# Patient Record
Sex: Female | Born: 1940 | Race: White | Hispanic: No | Smoking: Former smoker
Health system: Southern US, Community
[De-identification: ages and names within clinical notes are randomized; demographics above are authoritative.]

## PROBLEM LIST (undated history)

## (undated) DIAGNOSIS — K5900 Constipation, unspecified: Secondary | ICD-10-CM

## (undated) DIAGNOSIS — R51 Headache: Secondary | ICD-10-CM

## (undated) DIAGNOSIS — K5792 Diverticulitis of intestine, part unspecified, without perforation or abscess without bleeding: Secondary | ICD-10-CM

## (undated) DIAGNOSIS — I10 Essential (primary) hypertension: Secondary | ICD-10-CM

## (undated) DIAGNOSIS — C140 Malignant neoplasm of pharynx, unspecified: Secondary | ICD-10-CM

## (undated) DIAGNOSIS — M1990 Unspecified osteoarthritis, unspecified site: Secondary | ICD-10-CM

## (undated) DIAGNOSIS — E876 Hypokalemia: Secondary | ICD-10-CM

## (undated) DIAGNOSIS — H3530 Unspecified macular degeneration: Secondary | ICD-10-CM

## (undated) DIAGNOSIS — R918 Other nonspecific abnormal finding of lung field: Secondary | ICD-10-CM

## (undated) DIAGNOSIS — C029 Malignant neoplasm of tongue, unspecified: Secondary | ICD-10-CM

## (undated) DIAGNOSIS — K219 Gastro-esophageal reflux disease without esophagitis: Secondary | ICD-10-CM

## (undated) DIAGNOSIS — O009 Unspecified ectopic pregnancy without intrauterine pregnancy: Secondary | ICD-10-CM

## (undated) DIAGNOSIS — R519 Headache, unspecified: Secondary | ICD-10-CM

## (undated) DIAGNOSIS — E079 Disorder of thyroid, unspecified: Secondary | ICD-10-CM

## (undated) DIAGNOSIS — C159 Malignant neoplasm of esophagus, unspecified: Secondary | ICD-10-CM

## (undated) DIAGNOSIS — E039 Hypothyroidism, unspecified: Secondary | ICD-10-CM

## (undated) HISTORY — DX: Disorder of thyroid, unspecified: E07.9

## (undated) HISTORY — DX: Malignant neoplasm of esophagus, unspecified: C15.9

## (undated) HISTORY — PX: BREAST BIOPSY: SHX20

## (undated) HISTORY — DX: Other nonspecific abnormal finding of lung field: R91.8

## (undated) HISTORY — DX: Unspecified ectopic pregnancy without intrauterine pregnancy: O00.90

## (undated) HISTORY — DX: Malignant neoplasm of tongue, unspecified: C02.9

## (undated) HISTORY — DX: Hypokalemia: E87.6

---
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
## 1974-05-04 HISTORY — PX: TUBAL LIGATION: SHX77

## 1975-05-05 DIAGNOSIS — O009 Unspecified ectopic pregnancy without intrauterine pregnancy: Secondary | ICD-10-CM

## 1975-05-05 HISTORY — DX: Unspecified ectopic pregnancy without intrauterine pregnancy: O00.90

## 1979-05-05 HISTORY — PX: VAGINAL HYSTERECTOMY: SUR661

## 1982-05-04 HISTORY — PX: MASTECTOMY, PARTIAL: SHX709

## 1999-10-17 ENCOUNTER — Ambulatory Visit (HOSPITAL_COMMUNITY): Admission: RE | Admit: 1999-10-17 | Discharge: 1999-10-17 | Payer: Self-pay | Admitting: Neurosurgery

## 1999-10-17 ENCOUNTER — Encounter: Payer: Self-pay | Admitting: Neurosurgery

## 1999-11-17 ENCOUNTER — Ambulatory Visit (HOSPITAL_COMMUNITY): Admission: RE | Admit: 1999-11-17 | Discharge: 1999-11-17 | Payer: Self-pay | Admitting: Neurosurgery

## 1999-11-17 ENCOUNTER — Encounter: Payer: Self-pay | Admitting: Neurosurgery

## 1999-12-01 ENCOUNTER — Encounter: Payer: Self-pay | Admitting: Neurosurgery

## 1999-12-01 ENCOUNTER — Ambulatory Visit (HOSPITAL_COMMUNITY): Admission: RE | Admit: 1999-12-01 | Discharge: 1999-12-01 | Payer: Self-pay | Admitting: Neurosurgery

## 1999-12-16 ENCOUNTER — Ambulatory Visit (HOSPITAL_COMMUNITY): Admission: RE | Admit: 1999-12-16 | Discharge: 1999-12-16 | Payer: Self-pay | Admitting: Neurosurgery

## 1999-12-16 ENCOUNTER — Encounter: Payer: Self-pay | Admitting: Neurosurgery

## 2000-08-23 ENCOUNTER — Ambulatory Visit (HOSPITAL_COMMUNITY): Admission: RE | Admit: 2000-08-23 | Discharge: 2000-08-23 | Payer: Self-pay | Admitting: Internal Medicine

## 2000-08-23 ENCOUNTER — Encounter: Payer: Self-pay | Admitting: Internal Medicine

## 2000-08-24 ENCOUNTER — Other Ambulatory Visit: Admission: RE | Admit: 2000-08-24 | Discharge: 2000-08-24 | Payer: Self-pay | Admitting: Internal Medicine

## 2001-08-29 ENCOUNTER — Encounter: Payer: Self-pay | Admitting: Internal Medicine

## 2001-08-29 ENCOUNTER — Ambulatory Visit (HOSPITAL_COMMUNITY): Admission: RE | Admit: 2001-08-29 | Discharge: 2001-08-29 | Payer: Self-pay | Admitting: Internal Medicine

## 2002-10-18 ENCOUNTER — Encounter: Payer: Self-pay | Admitting: Internal Medicine

## 2002-10-18 ENCOUNTER — Ambulatory Visit (HOSPITAL_COMMUNITY): Admission: RE | Admit: 2002-10-18 | Discharge: 2002-10-18 | Payer: Self-pay | Admitting: Internal Medicine

## 2003-02-15 ENCOUNTER — Ambulatory Visit (HOSPITAL_COMMUNITY): Admission: RE | Admit: 2003-02-15 | Discharge: 2003-02-15 | Payer: Self-pay | Admitting: Internal Medicine

## 2003-02-15 ENCOUNTER — Encounter: Payer: Self-pay | Admitting: Internal Medicine

## 2003-05-05 HISTORY — PX: CHOLECYSTECTOMY: SHX55

## 2003-08-07 ENCOUNTER — Ambulatory Visit (HOSPITAL_COMMUNITY): Admission: RE | Admit: 2003-08-07 | Discharge: 2003-08-07 | Payer: Self-pay | Admitting: Internal Medicine

## 2003-08-10 ENCOUNTER — Ambulatory Visit (HOSPITAL_COMMUNITY): Admission: RE | Admit: 2003-08-10 | Discharge: 2003-08-10 | Payer: Self-pay | Admitting: Internal Medicine

## 2003-08-15 ENCOUNTER — Ambulatory Visit (HOSPITAL_COMMUNITY): Admission: RE | Admit: 2003-08-15 | Discharge: 2003-08-15 | Payer: Self-pay | Admitting: Internal Medicine

## 2003-08-21 ENCOUNTER — Observation Stay (HOSPITAL_COMMUNITY): Admission: RE | Admit: 2003-08-21 | Discharge: 2003-08-22 | Payer: Self-pay | Admitting: Internal Medicine

## 2004-09-03 ENCOUNTER — Ambulatory Visit (HOSPITAL_COMMUNITY): Admission: RE | Admit: 2004-09-03 | Discharge: 2004-09-03 | Payer: Self-pay | Admitting: Internal Medicine

## 2010-09-19 NOTE — H&P (Signed)
NAME:  Candace Lewis, Candace Lewis                       ACCOUNT NO.:  [id number]   MEDICAL RECORD NO.:  [id number]                   PATIENT TYPE:  AMB   LOCATION:  DAY                                  FACILITY:  [facility]   PHYSICIAN:  Bernerd Limbo. Leona Carry, M.D.             DATE OF BIRTH:  24-Aug-1940   DATE OF ADMISSION:  DATE OF DISCHARGE:                                HISTORY & PHYSICAL   This 70 year old white female was admitted to this hospital with a poorly  functioning gallbladder.   HISTORY OF PRESENT ILLNESS:  The patient was seen on recheck on August 06, 2003.  At that time, she had a three week history of nausea, belching and  dry heaves.  There has been no vomiting.  She pointed to the mid abdomen as  the site of her discomfort.  At the time she did not relate it to eating.  I  ordered a CT scan of her abdomen which was within normal limits.  Ultrasound  of the gallbladder did not reveal any stones, however the hepatobiliary scan  revealed an ejection fraction of 7%.  She is admitted now for a laparoscopic  cholecystectomy.  There has been no history of any jaundice.  She has been  having normal bowel movements.   PAST HISTORY:  The patient had a hysterectomy in 1981.  She has had three  previous partial mastectomies for fibrocystic disease.  She had an ectopic  in 1978, a tubal ligation in 1977.   MEDICATIONS:  She presently is taking:  1. Zocor 40 mg a day.  2. Prinzide 20/25 a half a tablet daily.   PHYSICAL EXAMINATION:  Reveals a healthy, well-developed 70 year old white  female in no acute distress.  HEENT:  Are normal.  No jaundice.  NECK:  Is supple.  The thyroid does not appear to be enlarged.  No palpable  cervical adenopathy.  CARDIOVASCULAR:  Regular sinus rhythm.  No thrills or murmurs.  RESPIRATORY:  Chest clear to percussion and auscultation.  BREASTS:  Are moderate in size.  Nipples symmetrical.  There are two well  healed operative scars in the upper outer  quadrant of both breasts.  Tissue  is irregular but no discrete masses are noted.  There is no tenderness noted  at the present time.  Examination of both axillae was normal.  ABDOMEN:  Is soft.  There is a well-healed midline pubic style umbilicus  operative scar.  No areas of masses, guarding or tenderness.  No  visceromegaly.  I do  not really feel any masses.  There is normal  peristalsis.  GENITALIA:  There is a marital introitus.  No evidence of any hernia.  The  vaginal cuff was well healed.  A recent Pap smear was normal.  RECTAL EXAMINATION:  Normal.  LIMBS AND BACK:  Negative.   ADMITTING DIAGNOSIS:  Chronic cholecystitis.   DISPOSITION:  The patient is admitted for  laparoscopic cholecystectomy.  The  surgery, risks and complications and possible consequences have been  discussed with this patient and she agrees with the surgery.  She has been  scheduled for August 21, 2003.     ___________________________________________                                         Bernerd Limbo. Leona Carry, M.D.   NMD/MEDQ  D:  08/20/2003  T:  08/20/2003  Job:  [id number]

## 2010-09-19 NOTE — Op Note (Signed)
NAME:  Candace Lewis, Candace Lewis                       ACCOUNT NO.:  [id number]   MEDICAL RECORD NO.:  [id number]                   PATIENT TYPE:  INP   LOCATION:  A318                                 FACILITY:  [facility]   PHYSICIAN:  Bernerd Limbo. Leona Carry, M.D.             DATE OF BIRTH:  [birth year]   DATE OF PROCEDURE:  DATE OF DISCHARGE:                                 OPERATIVE REPORT   PREOPERATIVE DIAGNOSIS:  Chronic cholecystitis.   POSTOPERATIVE DIAGNOSIS:  Chronic cholecystitis.   PROCEDURE:  Laparoscopic cholecystectomy.   COMPLICATIONS:  None.   SURGEON:  Bernerd Limbo. Leona Carry, M.D.   ASSISTANT:  Dalia Heading, M.D.   OPERATIVE PROCEDURE:  Under adequate general anesthesia the patient was  prepped and draped in the usual manner.  A small incision was made above the  umbilicus.  Through this opening a Veress needle was inserted into the  peritoneal cavity and the abdomen insufflated with carbon dioxide up to 4 to  5 liters under 15 mm of pressure.  Following this the Vis-A-Port was  inserted through this opening and the abdomen examined.  The liver appeared  to be normal.  There were a lot of adhesions around the liver which were  mobilized and released.   A small incision was made about 2 cm below, and to the right of the xiphoid;  and through this opening a 10-mm trocar was inserted.  Through the 10-mm  port the dissecting instrument was inserted.  Two small incisions were made  on the right.  The first about 2-cm below the lowest rib and the second, 2-  cm below that; and through these 5-mm openings the trocars were inserted.  Through these ports the retracting forceps were inserted.  The gallbladder  was then grasped at this tip and at the infundibulum; and upward and lateral  traction placed on this structure.  The cystic duct was identified,  mobilized, triply clipped distally and singly clipped proximally and  transected.  The cystic artery was identified, mobilized, doubly  clipped  distally, singly clipped proximally and transected.   Then, utilizing electrocautery the gallbladder was removed from below  upwards.  A few small bleeding points on the gallbladder bed were then  cauterized.  Surgicel was placed on the gallbladder bed; and all bleeding  was under control.  The gallbladder was removed through the upper 10-mm  port.  The fascia layers were closed with interrupted #1 Vicryl.  The skin  closed with skin clips.  Telfa, and OpSite dressing applied.  The patient  tolerated the procedure nicely and left the room in good condition.     ___________________________________________                                            Bernerd Limbo. Leona Carry, M.D.  NMD/MEDQ  D:  08/21/2003  T:  08/21/2003  Job:  [id number]

## 2013-01-19 ENCOUNTER — Other Ambulatory Visit (HOSPITAL_COMMUNITY): Payer: Self-pay | Admitting: *Deleted

## 2013-01-19 DIAGNOSIS — N63 Unspecified lump in unspecified breast: Secondary | ICD-10-CM

## 2013-01-19 DIAGNOSIS — N951 Menopausal and female climacteric states: Secondary | ICD-10-CM

## 2013-02-01 ENCOUNTER — Ambulatory Visit (HOSPITAL_COMMUNITY)
Admission: RE | Admit: 2013-02-01 | Discharge: 2013-02-01 | Disposition: A | Discharge status: Home / Self Care | Payer: Medicare Other | Source: Ambulatory Visit | Attending: *Deleted | Admitting: *Deleted

## 2013-02-01 ENCOUNTER — Encounter (HOSPITAL_COMMUNITY): Payer: Self-pay

## 2013-02-01 ENCOUNTER — Other Ambulatory Visit (HOSPITAL_COMMUNITY): Payer: Self-pay | Admitting: *Deleted

## 2013-02-01 DIAGNOSIS — N951 Menopausal and female climacteric states: Secondary | ICD-10-CM

## 2013-02-01 DIAGNOSIS — N63 Unspecified lump in unspecified breast: Secondary | ICD-10-CM

## 2013-02-01 DIAGNOSIS — N6009 Solitary cyst of unspecified breast: Secondary | ICD-10-CM | POA: Insufficient documentation

## 2013-02-01 MED ORDER — LIDOCAINE HCL (PF) 2 % IJ SOLN
INTRAMUSCULAR | Status: AC
  Filled 2013-02-01: qty 10

## 2013-02-01 MED ORDER — LIDOCAINE HCL (PF) 2 % IJ SOLN
10.0000 mL | Freq: Once | INTRAMUSCULAR | Status: AC
  Administered 2013-02-01: 10 mL

## 2013-02-01 NOTE — Progress Notes (Signed)
Breast aspiration complete no signs of distress.

## 2013-05-04 DIAGNOSIS — C159 Malignant neoplasm of esophagus, unspecified: Secondary | ICD-10-CM | POA: Insufficient documentation

## 2013-05-04 DIAGNOSIS — E876 Hypokalemia: Secondary | ICD-10-CM

## 2013-05-04 HISTORY — DX: Hypokalemia: E87.6

## 2013-05-04 HISTORY — PX: BIOPSY PHARYNX: SUR141

## 2013-05-04 HISTORY — DX: Malignant neoplasm of esophagus, unspecified: C15.9

## 2013-12-02 DIAGNOSIS — H9201 Otalgia, right ear: Secondary | ICD-10-CM | POA: Insufficient documentation

## 2014-01-24 DIAGNOSIS — C029 Malignant neoplasm of tongue, unspecified: Secondary | ICD-10-CM

## 2014-01-24 HISTORY — DX: Malignant neoplasm of tongue, unspecified: C02.9

## 2014-06-12 DIAGNOSIS — I951 Orthostatic hypotension: Secondary | ICD-10-CM | POA: Insufficient documentation

## 2014-06-12 DIAGNOSIS — R112 Nausea with vomiting, unspecified: Secondary | ICD-10-CM | POA: Insufficient documentation

## 2014-06-14 ENCOUNTER — Other Ambulatory Visit: Payer: Self-pay | Admitting: Radiation Oncology

## 2014-06-14 DIAGNOSIS — C01 Malignant neoplasm of base of tongue: Secondary | ICD-10-CM

## 2014-06-19 HISTORY — PX: ESOPHAGOGASTRODUODENOSCOPY: SHX1529

## 2014-07-03 ENCOUNTER — Ambulatory Visit (HOSPITAL_COMMUNITY)
Admission: RE | Admit: 2014-07-03 | Discharge: 2014-07-03 | Disposition: A | Discharge status: Home / Self Care | Payer: Medicare Other | Source: Ambulatory Visit | Attending: Radiation Oncology | Admitting: Radiation Oncology

## 2014-07-03 DIAGNOSIS — N281 Cyst of kidney, acquired: Secondary | ICD-10-CM | POA: Diagnosis not present

## 2014-07-03 DIAGNOSIS — C01 Malignant neoplasm of base of tongue: Secondary | ICD-10-CM | POA: Diagnosis present

## 2014-07-03 DIAGNOSIS — Z931 Gastrostomy status: Secondary | ICD-10-CM | POA: Diagnosis not present

## 2014-07-03 DIAGNOSIS — I700 Atherosclerosis of aorta: Secondary | ICD-10-CM | POA: Diagnosis not present

## 2014-07-03 DIAGNOSIS — R918 Other nonspecific abnormal finding of lung field: Secondary | ICD-10-CM | POA: Insufficient documentation

## 2014-07-03 LAB — GLUCOSE, CAPILLARY: Glucose-Capillary: 111 mg/dL — ABNORMAL HIGH (ref 70–99)

## 2014-07-03 MED ORDER — FLUDEOXYGLUCOSE F - 18 (FDG) INJECTION
7.6000 | Freq: Once | INTRAVENOUS | Status: AC | PRN
  Administered 2014-07-03: 7.6 via INTRAVENOUS

## 2014-08-29 DIAGNOSIS — R1012 Left upper quadrant pain: Secondary | ICD-10-CM | POA: Insufficient documentation

## 2015-01-08 DIAGNOSIS — R918 Other nonspecific abnormal finding of lung field: Secondary | ICD-10-CM

## 2015-01-08 HISTORY — DX: Other nonspecific abnormal finding of lung field: R91.8

## 2015-10-08 DIAGNOSIS — K5732 Diverticulitis of large intestine without perforation or abscess without bleeding: Secondary | ICD-10-CM | POA: Insufficient documentation

## 2015-10-08 DIAGNOSIS — D72810 Lymphocytopenia: Secondary | ICD-10-CM | POA: Insufficient documentation

## 2015-10-10 ENCOUNTER — Other Ambulatory Visit (HOSPITAL_COMMUNITY): Payer: Self-pay | Admitting: *Deleted

## 2015-10-10 DIAGNOSIS — N6011 Diffuse cystic mastopathy of right breast: Secondary | ICD-10-CM

## 2015-10-15 ENCOUNTER — Encounter: Payer: Self-pay | Admitting: Cardiothoracic Surgery

## 2015-10-15 ENCOUNTER — Other Ambulatory Visit: Payer: Self-pay | Admitting: *Deleted

## 2015-10-15 ENCOUNTER — Institutional Professional Consult (permissible substitution) (INDEPENDENT_AMBULATORY_CARE_PROVIDER_SITE_OTHER): Payer: Medicare Other | Admitting: Cardiothoracic Surgery

## 2015-10-15 ENCOUNTER — Other Ambulatory Visit (HOSPITAL_COMMUNITY): Payer: Self-pay | Admitting: *Deleted

## 2015-10-15 VITALS — BP 128/89 | HR 109 | Resp 16 | Ht 67.0 in | Wt 168.0 lb

## 2015-10-15 DIAGNOSIS — R918 Other nonspecific abnormal finding of lung field: Secondary | ICD-10-CM

## 2015-10-15 DIAGNOSIS — Z9109 Other allergy status, other than to drugs and biological substances: Secondary | ICD-10-CM

## 2015-10-15 DIAGNOSIS — Z85810 Personal history of malignant neoplasm of tongue: Secondary | ICD-10-CM | POA: Diagnosis not present

## 2015-10-15 DIAGNOSIS — Z91048 Other nonmedicinal substance allergy status: Secondary | ICD-10-CM | POA: Insufficient documentation

## 2015-10-15 DIAGNOSIS — D381 Neoplasm of uncertain behavior of trachea, bronchus and lung: Secondary | ICD-10-CM | POA: Diagnosis not present

## 2015-10-15 DIAGNOSIS — N6012 Diffuse cystic mastopathy of left breast: Secondary | ICD-10-CM

## 2015-10-15 NOTE — Progress Notes (Signed)
[address]       [place] [address]             [phone]                    Candace Lewis [facility] Medical Record [id number] Date of Birth: December 03, 1940  Referring: Everardo All, MD Primary Care: Octavio Graves, DO  Chief Complaint:    Chief Complaint  Patient presents with  . Lung Lesion    LUL...CT CHEST 10/07/15    History of Present Illness:    Candace Lewis 75 y.o. female is seen in the office  today for slowly enlarging lesion in the lung of the left upper lobe. The patient has a complex history of present previous cancer of the head and neck. 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 Since that time she's continued to have feeding problems frequently vomiting and she says lives off mostly ensure with milk. Recently she's also had a flareup of diverticulitis and is on by mouth antibiotics for this. She'd had a gastrostomy tube in the past but this has been removed.  She now comes in at the request of Dr. Abran Duke because of a semisolid left apical pulmonary nodule that previously had measured 2 x 1.5 cm and now measures 2.9 x 2.9 cm over a six-month period.   The patient has a previous history of smoking but notes she stopped in 1983. 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      Current Activity/ Functional Status:  Patient is independent with mobility/ambulation, transfers, ADL's, IADL's.   Zubrod Score: At the time of surgery this patient's most appropriate activity status/level should be described as: []     0    Normal activity, no symptoms [x]     1    Restricted in physical strenuous activity but ambulatory, able to do out light work []     2    Ambulatory and  capable of self care, unable to do work activities, up and about               >50 % of waking hours                              []     3    Only limited self care, in bed greater than 50% of waking hours []     4    Completely disabled, no self care, confined to bed or chair []     5    Moribund   Past Medical History   Hypertension    High cholesterol    High triglycerides    Squamous cell cancer of tongue (*)    Colon cancer (*)  cancer tongue  Right ear pain August 2015 since diagnosed with cancer tongue  Hypokalemia    Thrush of mouth and esophagus (*)  Dr Lisbeth Renshaw examined Radiation visit. Script Diflucan given.  Diverticulitis      Past Surgical History  Procedure Laterality Date  . Abdominal hysterectomy     TONSILLECTOMY 1950    SKIN SURGERY 1960  mole removal  TUBAL LIGATION 1976    ECTOPIC PREGNANCY  SURGERY 1977    VAGINAL HYSTERECTOMY 1981    MASTECTOMY, PARTIAL 1984    GALLBLADDER SURGERY 2005    EMPHYSEMA     PEG TUBE PLACEMENT 01/29/2014    PORTACATH PLACEMENT 01/29/2014       Family History: Patient's mother died of COPD and heart failure, father died of diabetes  Social History   Social History  . Marital Status: Married    Spouse Name: N/A  . Number of Children: N/A  . Years of Education: N/A   Occupational History  . 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   Social History Main Topics  . Smoking status: Former Smoker -- 1.00 packs/day for 23 years    Types: Cigarettes    Quit date: 05/03/1982  . Smokeless tobacco: Never Used  . Alcohol Use: Not on file  . Drug Use: Not on file  . Sexual Activity: Not on file     History  Smoking status  . Former Smoker -- 1.00 packs/day for 23 years  . Types: Cigarettes  . Quit date: 05/03/1982  Smokeless tobacco  . Never Used    History  Alcohol Use: Not on file     No Known  Allergies  Current Outpatient Prescriptions  Medication Sig Dispense Refill  . ciprofloxacin (CIPRO) 500 MG tablet Take by mouth.    . metroNIDAZOLE (FLAGYL) 500 MG tablet Take by mouth.    . promethazine (PHENERGAN) 25 MG tablet Take by mouth.    . Dexlansoprazole 30 MG capsule Take by mouth.    . docusate sodium (COLACE) 100 MG capsule Take by mouth.    . Homeopathic Products (ARNICA) GEL     . HYDROcodone-acetaminophen (NORCO/VICODIN) 5-325 MG tablet Take by mouth.     No current facility-administered medications for this visit.     Review of Systems:     Cardiac Review of Systems: Y or N  Chest Pain [  n  ]  Resting SOB [n   ] Exertional SOB  [ n ]  Orthopnea [n ]   Pedal Edema [ n  ]    Palpitations [ n ] Syncope  [ n ]   Presyncope [ n ]  General Review of Systems: [Y] = yes [  ]=no Constitional: recent weight change [  ];  Wt loss over the last 3 months [ n  ] anorexia [  ]; fatigue [  ]; nausea [  ]; night sweats [  ]; fever [  ]; or chills [  ];          Dental: poor dentition[ dentures ]; Last Dentist visit:   Eye : blurred vision [  ]; diplopia [   ]; vision changes [  ];  Amaurosis fugax[  ]; Resp: cough [  ];  wheezing[ y];  hemoptysis[  ]; shortness of breath[  ]; paroxysmal nocturnal dyspnea[  ]; dyspnea on exertion[  ]; or orthopnea[  ];  GI:  gallstones[  ], vomiting[ y ];  dysphagia[y  ]; melena[ n ];  hematochezia [n  ]; heartburn[  ];   Hx of  Colonoscopy[  ]; GU: kidney stones [  ]; hematuria[  ];   dysuria [  ];  nocturia[  ];  history of     obstruction [  ]; urinary frequency [  ]             Skin: rash,  swelling[  ];, hair loss[  ];  peripheral edema[  ];  or itching[  ]; Musculosketetal: myalgias[  ];  joint swelling[  ];  joint erythema[  ];  joint pain[  ];  back pain[  ];  Heme/Lymph: bruising[  ];  bleeding[  ];  anemia[  ];  Neuro: TIA[  ];  headaches[  ];  stroke[  ];  vertigo[  ];  seizures[  ];   paresthesias[  ];  difficulty walking[   ];  Psych:depression[  ]; anxiety[  ];  Endocrine: diabetes[n  ];  thyroid dysfunction[  ];  Immunizations: Flu up to date [ y ]; Pneumococcal up to date [ n ];  Other:  Physical Exam: BP 128/89 mmHg  Pulse 109  Resp 16  Ht 5\' 7"  (1.702 m)  Wt 168 lb (76.204 kg)  BMI 26.31 kg/m2  SpO2 98%  PHYSICAL EXAMINATION: General appearance: alert, cooperative and no distress Head: Normocephalic, without obvious abnormality, atraumatic Neck: no adenopathy, no carotid bruit, no JVD, supple, symmetrical, trachea midline, thyroid not enlarged, symmetric, no tenderness/mass/nodules and There is thickening of the skin of the neck but node definite palpable cervical adenopathy or cervical mass Lymph nodes: Cervical, supraclavicular, and axillary nodes normal. Resp: clear to auscultation bilaterally Back: symmetric, no curvature. ROM normal. No CVA tenderness. Cardio: regular rate and rhythm, S1, S2 normal, no murmur, click, rub or gallop GI: soft, non-tender; bowel sounds normal; no masses,  no organomegaly Extremities: extremities normal, atraumatic, no cyanosis or edema and Homans sign is negative, no sign of DVT Neurologic: Grossly normal  Diagnostic Studies & Laboratory data:     Recent Radiology Findings:  CLINICAL DATA: Followup of pulmonary nodule. Throat cancer in 2015 with chemotherapy and radiation therapy.  EXAM: CT CHEST WITHOUT CONTRAST  TECHNIQUE: Multidetector CT imaging of the chest was performed following the standard protocol without IV contrast.  COMPARISON: Chest radiograph 09/18/2015. Abdominal pelvic CT 09/18/2015. Chest CT 04/05/2015.  FINDINGS: Mediastinum/Nodes: Aortic and branch vessel atherosclerosis. Normal heart size, without pericardial effusion. LAD coronary artery atherosclerosis. No mediastinal or definite hilar adenopathy, given limitations of unenhanced CT.  Lungs/Pleura: No pleural fluid. Biapical pleural-parenchymal scarring. A 3 mm right upper  lobe pulmonary nodule on image 29/series 4 is felt to be similar to on the prior exam. There are smaller right apical pulmonary nodules, including at 3 mm on image 30/ series 4, which are likely more apparent today secondary to differences in slice thickness.  A 3 mm right lower lobe subpleural nodule on image 69/series 4 similar.  Sub solid left apical pulmonary nodule measures 2.9 x 2.9 cm on image 30/series 4. Compare 2.0 x 1.5 cm on the most recent exam. The solid components measure on the order of 8 mm on image 27/series 4 and are also felt to be increased.  Upper abdomen: Cholecystectomy. Normal imaged portions of the liver, spleen, stomach, pancreas, adrenal glands, kidneys.  Musculoskeletal: Nodularity in the lateral left breast measures 1.3 cm on image 70/series 2 and is unchanged. No acute osseous abnormality.  IMPRESSION: 1. Increase in size of a sub solid left apical pulmonary nodule. This remains suspicious for a low-grade adenocarcinoma, which had regressed on the prior exam secondary to chemotherapy. 2. Other pulmonary nodules are primarily similar. Some right apical nodules are more conspicuous today, favored to be due to differences in slice thickness. 3. Atherosclerosis, including within the coronary arteries. 4. Similar left breast nodule which could be re-evaluated at followup or more entirely characterized  with mammogram/ultrasound. 5. No thoracic adenopathy to suggest nodal metastasis.   Electronically Signed By: Abigail Miyamoto M.D. On: 10/07/2015 14:02  Final Report  CLINICAL DATA: 75 year old female with history of multiple pulmonary nodules. Followup study. Additional history of tongue cancer.  EXAM: CT CHEST WITH CONTRAST  TECHNIQUE: Multidetector CT imaging of the chest was performed during intravenous contrast administration.  CONTRAST: 60 mL of Isovue 370.  COMPARISON: Chest CT 01/04/2015. PET-CT 07/03/2014.  FINDINGS: Mediastinum/Lymph  Nodes: Heart size is normal. There is no significant pericardial fluid, thickening or pericardial calcification. There is atherosclerosis of the thoracic aorta, the great vessels of the mediastinum and the coronary arteries, including calcified atherosclerotic plaque in the left anterior descending coronary artery. No pathologically enlarged mediastinal or hilar lymph nodes. Esophagus is unremarkable in appearance. No axillary lymphadenopathy. Right internal jugular single-lumen porta cath with tip terminating in the distal superior vena cava.  Lungs/Pleura: Again noted is an ill-defined nodular area in the apex of the left upper lobe, which is predominantly ground-glass attenuation (image 15 of series 4) measuring 2.0 x 1.5 cm (slightly smaller than prior study 01/04/2015 at which point this measured 2.1 x 2.2 cm), with a tiny central solid component measuring only 4 mm (image 15 of series 2), which is also slightly less apparent than prior studies. No other new suspicious appearing pulmonary nodules or masses are identified. The no acute consolidative airspace disease. No pleural effusions.  Upper Abdomen: Status post cholecystectomy.  Musculoskeletal/Soft Tissues: There are no aggressive appearing lytic or blastic lesions noted in the visualized portions of the skeleton.  IMPRESSION: 1. Previously described mixed solid and sub solid nodule in the apex of the left upper lobe is less apparent than prior examinations. Although the regression is reassuring, based on behavior on prior examinations, this remains concerning for potential low-grade adenocarcinoma, and the regression may simply reflect response to chemotherapy for the patient's head neck cancer. Continued attention on future followup examinations is recommended. 2. No other suspicious appearing pulmonary nodules or masses in the lungs at this time to suggest metastatic disease. 3. Atherosclerosis, including left  anterior descending coronary artery disease. Please note that although the presence of coronary artery calcium documents the presence of coronary artery disease, the severity of this disease and any potential stenosis cannot be assessed on this non-gated CT examination. Assessment for potential risk factor modification, dietary therapy or pharmacologic therapy may be warranted, if clinically indicated. 4. Additional incidental findings, as above.   Electronically Signed By: Vinnie Langton M.D. On: 04/05/2015 13:17    Recent Lab Findings: No results found for: WBC, HGB, HCT, PLT, GLUCOSE, CHOL, TRIG, HDL, LDLDIRECT, LDLCALC, ALT, AST, NA, K, CL, CREATININE, BUN, CO2, TSH, INR, GLUF, HGBA1C    Assessment / Plan:   1/ History of squamous cell carcinoma of the tongue, treated with radiation and chemotherapy 2015 2/ Increase in size of a sub solid left apical pulmonary nodule suspicious for a low-grade adenocarcinoma, which had regressed on the prior exam secondary    to chemotherapy. 3/ Atherosclerosis, including left anterior descending coronary artery disease.- by ct   With patient's previous history of advanced stage squamous cell carcinoma of the tongue we will plan to proceed with obtaining a CD copy of the most recent CT of the chest for navigation bronchoscopy, or repeat super D CT scan if [facility] has raw data CT DATA for navigation  bronchoscopy . We'll proceed with obtaining a PET scan, the primary lesion in question is of groundglass opacity  and may not be hypermetabolic but we will need to look for other areas of possible metastatic disease before proceeding.   After PFTs and PET scan are obtained also to patient back in the office and consult with oncology.   I  spent 40 minutes counseling the patient face to face and 50% or more the  time was spent in counseling and coordination of care. The total time spent in the appointment was 60 minutes.  Grace Isaac MD        [address] [place] [address] Office [phone]   Beeper [phone]  10/16/2015 10:41 AM

## 2015-10-16 ENCOUNTER — Encounter (HOSPITAL_COMMUNITY): Payer: Medicare Other | Attending: Hematology & Oncology | Admitting: Hematology & Oncology

## 2015-10-16 ENCOUNTER — Encounter (HOSPITAL_COMMUNITY): Payer: Self-pay | Admitting: Hematology & Oncology

## 2015-10-16 VITALS — BP 126/78 | HR 120 | Temp 98.5°F | Resp 18 | Ht 67.0 in | Wt 168.0 lb

## 2015-10-16 DIAGNOSIS — B379 Candidiasis, unspecified: Secondary | ICD-10-CM

## 2015-10-16 DIAGNOSIS — R911 Solitary pulmonary nodule: Secondary | ICD-10-CM | POA: Diagnosis not present

## 2015-10-16 DIAGNOSIS — R112 Nausea with vomiting, unspecified: Secondary | ICD-10-CM | POA: Diagnosis not present

## 2015-10-16 DIAGNOSIS — K5732 Diverticulitis of large intestine without perforation or abscess without bleeding: Secondary | ICD-10-CM | POA: Diagnosis not present

## 2015-10-16 DIAGNOSIS — C029 Malignant neoplasm of tongue, unspecified: Secondary | ICD-10-CM

## 2015-10-16 MED ORDER — GUAIFENESIN 200 MG/10ML PO SOLN: 20.0000 mL | Freq: Three times a day (TID) | ORAL | Status: DC | PRN

## 2015-10-16 NOTE — Progress Notes (Deleted)
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 female is here because of ***    MEDICAL HISTORY:  No past medical history on file.  SURGICAL HISTORY: Past Surgical History  Procedure Laterality Date  . Abdominal hysterectomy      SOCIAL HISTORY: Social History   Social History  . Marital Status: Married    Spouse Name: N/A  . Number of Children: N/A  . Years of Education: N/A   Occupational History  . Not on file.   Social History Main Topics  . Smoking status: Former Smoker -- 1.00 packs/day for 23 years    Types: Cigarettes    Quit date: 05/03/1982  . Smokeless tobacco: Never Used  . Alcohol Use: Not on file  . Drug Use: Not on file  . Sexual Activity: Not on file   Other Topics Concern  . Not on file   Social History Narrative    FAMILY HISTORY: No family history on file.  ALLERGIES:  has No Known Allergies.  MEDICATIONS:  Current Outpatient Prescriptions  Medication Sig Dispense Refill  . ciprofloxacin (CIPRO) 500 MG tablet Take by mouth.    . Dexlansoprazole 30 MG capsule Take by mouth.    . docusate sodium (COLACE) 100 MG capsule Take by mouth.    . Homeopathic Products (ARNICA) GEL       . HYDROcodone-acetaminophen (NORCO/VICODIN) 5-325 MG tablet Take by mouth.    . metroNIDAZOLE (FLAGYL) 500 MG tablet Take by mouth.    . promethazine (PHENERGAN) 25 MG tablet Take by mouth.     No current facility-administered medications for this visit.    ROS 14 point ROS was done and is otherwise as detailed above or in HPI   PHYSICAL EXAMINATION: ECOG PERFORMANCE STATUS: {CHL ONC ECOG PS:[phone]}  There were no vitals filed for this visit. There were no vitals filed for this visit.   Physical Exam    LABORATORY DATA:  I have reviewed the data as listed No results found for: WBC, HGB, HCT, MCV, PLT CMP  No results found for: NA, K, CL, CO2, GLUCOSE, BUN, CREATININE, CALCIUM, PROT, ALBUMIN, AST, ALT, ALKPHOS, BILITOT, GFRNONAA, GFRAA   RADIOGRAPHIC STUDIES: I have personally reviewed the radiological images as listed and agreed with the findings in the report. No results found.  ASSESSMENT & PLAN:  No matching staging information was found for the patient. No problem-specific assessment & plan notes found for this encounter.   ORDERS PLACED FOR THIS ENCOUNTER: No orders of the defined types were placed in this encounter.    MEDICATIONS PRESCRIBED THIS ENCOUNTER: No orders of the defined types were placed in this encounter.     All questions were answered. The patient knows to call the clinic with any problems,  questions or concerns.  This note was electronically signed.    Molli Hazard, MD  10/16/2015 12:07 PM

## 2015-10-16 NOTE — Progress Notes (Signed)
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 female is here because of referral from Dr. Melina Copa for a history of head and neck cancer and a new LUL nodule. She has just seen Dr. Verlene Mayer in consultation for the lung nodule. Per records the nodule measured 2 x 1.5 cm and has grown to 2.9 x 2.9 cm over a 6 month period. She was a previous smoker but quit many years ago in 1983. Dr. Everrett Coombe note is reviewed and plan is for PFT's, PET and RTC to follow-up with him.   Candace Lewis is unaccompanied. She goes by [name]".   She has a history of head and neck cancer which began with a sore throat. She had a difficult time with radiation therapy. She also received chemotherapy treatment, during which she was hospitalized twice. Per Dr. Jaclyn Prime records she received only 4 doses "due to severe toxicity with neutropenia , severe dehydration, nausea and vomiting requiring hospitalization and severe obstipation."  Reports she still has difficulty swallowing and cannot even swallow half a tylenol pill. She has undergone several studies to investigate her trouble swallowing. She follows with Dr. Britta Mccreedy of GI in [place]. She has  recurrent nausea and vomiting. She has had a thorough evaluation including EGD, barium esophogram, motility studies. She currently consumes primarily ensure diluted with milk. She occasionally uses applesauce and pudding. She has maintained her weight suprisingly well.   Reports constipation secondary to not eating. Admits she typically runs a bit constipated, and her bowels are slightly softer with Colace.   The patient is curious how she can decrease the phlegm in her throat without taking something that would increase her heart rate. She feels like her tongue is bigger than it should be. She coughs up thick secretions about every other day.  She is unable to wear her dentures without throwing up. Notes "I have thrown up more in the last 2 years than in the previous 70 years of my life". Notes she typically vomits a couple of hours after her feedings. She has not vomited in the last couple of days since decreasing her fluid intake.  She has just been treated for diverticulitis. She is still taking oral antibiotics.   She last saw Dr. Isidore Moos in March 2017 and will follow up with her again in October 2017.  She has a screening mammogram scheduled for 10/22/2015 and a pulmonary function test on 10/23/2015.  The patient is here for further evaluation and discussion of ongoing follow-up of her prior head and neck cancer, and LUL nodule.  MEDICAL HISTORY:  Past Medical History  Diagnosis Date  . Thyroid disease   . 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 Hypokalemia 2015  . Ectopic pregnancy 1977  SURGICAL HISTORY: Past Surgical History  Procedure Laterality Date  . Tubal ligation  1976  . Mastectomy, partial  1984  . Biopsy pharynx  2015  . Vaginal hysterectomy  1981    vaginal hyst per patient report  . Cholecystectomy  2005  . Tonsillectomy  1950    per patient report  . Skin surgery  1960    moles removed from various areas per patient report    SOCIAL HISTORY: Social History   Social  History  . Marital Status: Married    Spouse Name: N/A  . Number of Children: N/A  . Years of Education: N/A   Occupational History  . Not on file.   Social History Main Topics  . Smoking status: Former Smoker -- 1.00 packs/day for 23 years    Types: Cigarettes    Quit date: 05/03/1982  . Smokeless tobacco: Never Used  . Alcohol Use: No  . Drug Use: No  . Sexual Activity: Not on file   Other Topics Concern  . Not on file   Social History Narrative   Married 0 children Ex-smoker. Smoked from 50 years old to 13.  ETOH, none. 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  FAMILY HISTORY: History reviewed. No pertinent family history.  Father deceased at 38 yo. He had cirrhosis of the liver and was a diabetic Mother died of emphysema secondary to smoking at 31 yo 2 brothers. 1 brother passed with alcoholism. Doesn't talk much to her other brother.  ALLERGIES:  is allergic to adhesive and latex.  MEDICATIONS:  Current Outpatient Prescriptions  Medication Sig Dispense Refill  . acetaminophen-codeine (TYLENOL #3) 300-30 MG tablet Take 1 tablet by mouth every 6 (six) hours as needed for moderate pain.    . ciprofloxacin (CIPRO) 500 MG tablet Take by mouth.    . clotrimazole (MYCELEX) 10 MG troche Take 10 mg by mouth 5 (five) times daily.    Marland Kitchen Dexlansoprazole 30 MG capsule Take by mouth.    . docusate sodium (COLACE) 100 MG capsule Take by mouth.    . levothyroxine (SYNTHROID, LEVOTHROID) 25 MCG tablet Take 25 mcg by mouth daily before breakfast.    . metroNIDAZOLE (FLAGYL) 500 MG tablet Take by mouth.    . promethazine (PHENERGAN) 25 MG tablet Take by mouth.    . Homeopathic Products (ARNICA) GEL     . HYDROcodone-acetaminophen (NORCO/VICODIN) 5-325 MG tablet Take by mouth. Reported on 10/16/2015     No current facility-administered medications for this visit.    Review of Systems  Constitutional: Negative.   HENT: Negative.         Trouble swallowing. Dry mouth. Intermittent episodes of violent hiccups  Eyes: Negative.   Respiratory: Positive for sputum production.        Coughing up thick secretions about every other day.  Cardiovascular: Negative.   Gastrointestinal: Positive for nausea, vomiting and constipation.       Constipation secondary to not eating. Managed with Colace. Nausea and vomiting that comes in cycles. Also vomiting with wearing dentures. Acid reflux  Genitourinary: Negative.   Musculoskeletal: Negative.   Skin: Negative.   Neurological: Negative.   Endo/Heme/Allergies: Negative.   Psychiatric/Behavioral: Negative.   All other systems reviewed and are negative.  14 point ROS was done and is otherwise as detailed above or in HPI   PHYSICAL EXAMINATION: ECOG PERFORMANCE STATUS: 1 - Symptomatic but completely ambulatory  Filed Vitals:   10/16/15 1311  BP: 126/78  Pulse: 120  Temp: 98.5 F (36.9 C)  Resp: 18   Filed Weights   10/16/15 1311  Weight: 168 lb (76.204 kg)     Physical Exam  Constitutional: She is oriented to person, place, and time and well-developed, well-nourished, and in no distress.  HENT:  Head: Normocephalic and atraumatic.  Nose: Nose normal.  Mouth/Throat: Oropharynx is clear and moist. No oropharyngeal exudate.  Thrush noted.  Eyes: Conjunctivae and EOM are normal. Pupils are equal, round, and reactive to light. Right eye exhibits no discharge. Left eye exhibits no discharge. No scleral icterus.  Neck: Normal range of motion. Neck supple. No tracheal deviation present. No thyromegaly present.  Firmness of skin on neck and limited ROM secondary to previous radiation treatment.  Cardiovascular: Normal rate, regular rhythm and normal heart sounds.  Exam reveals no gallop and no friction rub.   No murmur heard. Pulmonary/Chest: Effort normal and breath sounds normal. She has no wheezes. She has no rales.  Abdominal: Soft. Bowel sounds are normal. She exhibits  no distension and no mass. There is no tenderness. There is no rebound and no guarding.  Musculoskeletal: Normal range of motion. She exhibits no edema.  Lymphadenopathy:    She has no cervical adenopathy.  Neurological: She is alert and oriented to person, place, and time. She has normal reflexes. No cranial nerve deficit. Gait normal. Coordination normal.  Skin: Skin is warm and dry. No rash noted.  Psychiatric: Mood, memory, affect and judgment normal.  Nursing note and vitals reviewed.   LABORATORY DATA:  I have reviewed the data as listed No results found for: WBC, HGB, HCT, MCV, PLT CMP  No results found for: NA, K, CL, CO2, GLUCOSE, BUN, CREATININE, CALCIUM, PROT, ALBUMIN, AST, ALT, ALKPHOS, BILITOT, GFRNONAA, GFRAA   RADIOGRAPHIC STUDIES: I have personally reviewed the radiological images as listed and agreed with the findings in the report.  CLINICAL DATA: Subsequent treatment strategy for right oropharyngeal/base of tongue head and neck cancer.  EXAM: NUCLEAR MEDICINE PET SKULL BASE TO THIGH  TECHNIQUE: 7.6 mCi F-18 FDG was injected intravenously. Full-ring PET imaging was performed from the skull base to thigh after the radiotracer. CT data was obtained and used for attenuation correction and anatomic localization.  FASTING BLOOD GLUCOSE: Value: 111 mg/dl  COMPARISON: Chest CTs 12/19/2013 and 06/13/2014. CTs of the chest, abdomen and pelvis 06/13/2014.  FINDINGS: NECK  No hypermetabolic cervical lymph nodes are identified.There is focal hypermetabolic activity (SUV max 8.9) at the tip of the tongue which is nonspecific and possibly physiologic (related to talking) or treated disease. This appears non focal and without correlative lesion on CT. There is symmetric physiologic activity associated with the muscles of pronation.  CHEST  There are no hypermetabolic mediastinal, hilar or axillary lymph nodes. The posterior left upper lobe apical  ground-glass density demonstrates low level metabolic activity with an SUV max of 2.2. This density measures approximately 2.6 x 1.5 cm on image 15 and has a questionable central solid component measuring 4 mm. 4 mm right upper lobe nodule on image 19 is unchanged, too small to characterize by PET. There are additional tiny nodules bilaterally.  ABDOMEN/PELVIS  There is no hypermetabolic activity within the liver, adrenal glands, spleen or pancreas. There is no hypermetabolic nodal activity. Percutaneous G-tube, aortoiliac atherosclerosis and a cyst involving the lower pole of the right kidney are noted. Patient is status post cholecystectomy and hysterectomy.  SKELETON  There is no hypermetabolic activity to suggest osseous metastatic disease.  IMPRESSION: 1. No evidence of metastatic head and neck cancer. 2. Nonspecific activity at the tip of the tongue, potentially physiologic or related to treated tumor. Correlation with direct inspection recommended. 3. Left apical ground-glass density demonstrates low-level metabolic activity and a questionable central solid component. Based on persistence, this is unlikely to be inflammatory and was present on the pretreatment examination of August 2015. Appearance is concerning for possible adenomatous hyperplasia or early bronchogenic adenocarcinoma. Based on the small size of the questionable solid component, continued CT surveillance is warranted in 12 months, with continued annual surveillance for a minimum of 3 years. These recommendations are taken from: Recommendations for the Management of Subsolid Pulmonary Nodules Detected at CT: A Statement from the [facility] 2013; 266:1, [phone].  *Other small pulmonary nodules are grossly stable.   Electronically Signed  By: Richardean Sale M.D.  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  In regards to her lung nodule, the patient is scheduled for a PET scan on 10/24/2015. Pulmonary function test scheduled for 10/23/2015. She will follow up with Dr. Servando Snare on 10/24/2015 after her PET scan. I advised her that biopsy will be needed and will defer ongoing evaluation to Dr. Servando Snare, will continue to follow through biopsy and path. I advised her recommendations will be made regarding therapy once pathology is known.   In regards to her head and neck cancer, she is to continue to follow with Dr. Isidore Moos. I have encouraged her to follow-up with ENT and will need to see when her last appointment was, she is currently uncertain.I have written the patient a prescription for liquid Muciniex and instructions on how to take Mucinex to alleviate thick secretions.   I have had her meet with nutrition here today. I am uncertain as to the cause of her eating difficulties. She has had an extremely thorough GI evaluation with Dr. Britta Mccreedy. I have asked her to keep a detailed dairy of consumption of foods and vomiting episodes. She is currently on clotrimazole given to her by her PCP Dr. Melina Copa for her thrush.   I will see her back the first week of July. Additional recommendations will follow.   She also has a mammogram scheduled on 10/22/2015, I have encouraged her to keep this appointment.  All questions were answered. The patient knows to call the clinic with any problems, questions or concerns.  This document serves as a record of services personally performed by Ancil Linsey, MD. It was created on her behalf by Arlyce Harman, a trained medical scribe. The creation of this record is based on the scribe's personal observations and the provider's statements to them. This document has been checked and approved by  the attending provider.  I have reviewed the above documentation for accuracy and completeness, and I agree with the above.  This note was electronically signed.  Molli Hazard, MD  10/16/2015 1:42 PM

## 2015-10-16 NOTE — Patient Instructions (Signed)
[place] at [facility] Discharge Instructions  RECOMMENDATIONS MADE BY THE CONSULTANT AND ANY TEST RESULTS WILL BE SENT TO YOUR REFERRING PHYSICIAN.  Return the end of first week of July (after you have seen Dr. Darnell Level and had a bx of lung nodule)  You met with Ovid Curd (dietitian) today and we want you to keep a diary of all of you oral intake and vomiting episodes  We are calling in a prescription for Mucinex that we want you to try for your secretions. Try it for at least 2 weeks. If you have trouble with it please let us know.   If the yeast in your mouth is not gone by Monday you need to let us know.  We need to get a copy of your records from the GI MD in [facility] Dr. Britta Mccreedy      Thank you for choosing [place] at [facility] to provide your oncology and hematology care.  To afford each patient quality time with our provider, please arrive at least 15 minutes before your scheduled appointment time.   Beginning January 23rd 2017 lab work for the [facility] will be done in the  Main lab at [facility] on 1st floor. If you have a lab appointment with the [facility] please come in thru the  Main Entrance and check in at the main information desk  You need to re-schedule your appointment should you arrive 10 or more minutes late.  We strive to give you quality time with our providers, and arriving late affects you and other patients whose appointments are after yours.  Also, if you no show three or more times for appointments you may be dismissed from the clinic at the providers discretion.     Again, thank you for choosing [facility].  Our hope is that these requests will decrease the amount of time that you wait before being seen by our physicians.       _____________________________________________________________  Should you have questions after your visit to [facility], please contact our office at [phone]  [phone] between the hours of 8:30 a.m. and 4:30 p.m.  Voicemails left after 4:30 p.m. will not be returned until the following business day.  For prescription refill requests, have your pharmacy contact our office.         Resources For Cancer Patients and their Caregivers ? American Cancer Society: Can assist with transportation, wigs, general needs, runs Look Good Feel Better.        539-197-4222 ? Cancer Care: Provides financial assistance, online support groups, medication/co-pay assistance.  1-800-813-HOPE 579-682-6110) ? [facility] Assists [place] cancer patients and their families through emotional , educational and financial support.  512-524-6814 ? [place] DSS Where to apply for food stamps, Medicaid and utility assistance. 5136772695 ? [facility]: Transportation to medical appointments. 8577687355 ? Social Security Administration: May apply for disability if have a Stage IV cancer. 571-159-2457 684-459-5518 ? [employer], Disability and Transit Services: Assists with nutrition, care and transit needs. [place] Support Programs: @10RELATIVEDAYS @ > Cancer Support Group  2nd Tuesday of the month 1pm-2pm, Journey Room  > Creative Journey  3rd Tuesday of the month 1130am-1pm, Journey Room  > Look Good Feel Better  1st Wednesday of the month 10am-12 noon, Journey Room (Call [facility] to register [phone])

## 2015-10-17 ENCOUNTER — Encounter (HOSPITAL_COMMUNITY): Payer: Self-pay | Admitting: Hematology & Oncology

## 2015-10-17 DIAGNOSIS — R911 Solitary pulmonary nodule: Secondary | ICD-10-CM | POA: Insufficient documentation

## 2015-10-22 ENCOUNTER — Ambulatory Visit (HOSPITAL_COMMUNITY)
Admission: RE | Admit: 2015-10-22 | Discharge: 2015-10-22 | Disposition: A | Discharge status: Home / Self Care | Payer: Medicare Other | Source: Ambulatory Visit | Attending: *Deleted | Admitting: *Deleted

## 2015-10-22 DIAGNOSIS — N6011 Diffuse cystic mastopathy of right breast: Secondary | ICD-10-CM

## 2015-10-22 DIAGNOSIS — N6012 Diffuse cystic mastopathy of left breast: Secondary | ICD-10-CM

## 2015-10-23 ENCOUNTER — Other Ambulatory Visit: Payer: Self-pay

## 2015-10-23 ENCOUNTER — Ambulatory Visit (HOSPITAL_COMMUNITY)
Admission: RE | Admit: 2015-10-23 | Discharge: 2015-10-23 | Disposition: A | Discharge status: Home / Self Care | Payer: Medicare Other | Source: Ambulatory Visit | Attending: Cardiothoracic Surgery | Admitting: Cardiothoracic Surgery

## 2015-10-23 DIAGNOSIS — R918 Other nonspecific abnormal finding of lung field: Secondary | ICD-10-CM | POA: Insufficient documentation

## 2015-10-23 DIAGNOSIS — R911 Solitary pulmonary nodule: Secondary | ICD-10-CM

## 2015-10-23 LAB — PULMONARY FUNCTION TEST
DL/VA % pred: 64 %
DL/VA: 3.3 ml/min/mmHg/L
DLCO unc % pred: 59 %
DLCO unc: 16.75 ml/min/mmHg
FEF 25-75 Post: 2.09 L/sec
FEF 25-75 Pre: 1.3 L/sec
FEF2575-%Change-Post: 60 %
FEF2575-%Pred-Post: 108 %
FEF2575-%Pred-Pre: 67 %
FEV1-%Change-Post: 9 %
FEV1-%Pred-Post: 93 %
FEV1-%Pred-Pre: 85 %
FEV1-Post: 2.3 L
FEV1-Pre: 2.1 L
FEV1FVC-%Change-Post: 7 %
FEV1FVC-%Pred-Pre: 95 %
FEV6-%Change-Post: 1 %
FEV6-%Pred-Post: 95 %
FEV6-%Pred-Pre: 93 %
FEV6-Post: 2.95 L
FEV6-Pre: 2.92 L
FEV6FVC-%Change-Post: 0 %
FEV6FVC-%Pred-Post: 104 %
FEV6FVC-%Pred-Pre: 104 %
FVC-%Change-Post: 1 %
FVC-%Pred-Post: 91 %
FVC-%Pred-Pre: 89 %
FVC-Post: 2.99 L
FVC-Pre: 2.93 L
Post FEV1/FVC ratio: 77 %
Post FEV6/FVC ratio: 100 %
Pre FEV1/FVC ratio: 72 %
Pre FEV6/FVC Ratio: 99 %
RV % pred: 114 %
RV: 2.74 L
TLC % pred: 105 %
TLC: 5.79 L

## 2015-10-23 MED ORDER — ALBUTEROL SULFATE (2.5 MG/3ML) 0.083% IN NEBU
2.5000 mg | INHALATION_SOLUTION | Freq: Once | RESPIRATORY_TRACT | Status: AC
  Administered 2015-10-23: 2.5 mg via RESPIRATORY_TRACT

## 2015-10-24 ENCOUNTER — Ambulatory Visit
Admission: RE | Admit: 2015-10-24 | Discharge: 2015-10-24 | Disposition: A | Discharge status: Home / Self Care | Payer: Medicare Other | Source: Ambulatory Visit | Attending: Cardiothoracic Surgery | Admitting: Cardiothoracic Surgery

## 2015-10-24 ENCOUNTER — Ambulatory Visit (HOSPITAL_COMMUNITY)
Admission: RE | Admit: 2015-10-24 | Discharge: 2015-10-24 | Disposition: A | Discharge status: Home / Self Care | Payer: Medicare Other | Source: Ambulatory Visit | Attending: Cardiothoracic Surgery | Admitting: Cardiothoracic Surgery

## 2015-10-24 ENCOUNTER — Encounter: Payer: Self-pay | Admitting: Cardiothoracic Surgery

## 2015-10-24 ENCOUNTER — Other Ambulatory Visit: Payer: Self-pay

## 2015-10-24 ENCOUNTER — Ambulatory Visit (INDEPENDENT_AMBULATORY_CARE_PROVIDER_SITE_OTHER): Payer: Medicare Other | Admitting: Cardiothoracic Surgery

## 2015-10-24 VITALS — BP 110/76 | HR 97 | Resp 16 | Ht 67.0 in | Wt 168.0 lb

## 2015-10-24 DIAGNOSIS — Z85810 Personal history of malignant neoplasm of tongue: Secondary | ICD-10-CM | POA: Diagnosis not present

## 2015-10-24 DIAGNOSIS — I2510 Atherosclerotic heart disease of native coronary artery without angina pectoris: Secondary | ICD-10-CM | POA: Diagnosis not present

## 2015-10-24 DIAGNOSIS — D381 Neoplasm of uncertain behavior of trachea, bronchus and lung: Secondary | ICD-10-CM | POA: Diagnosis not present

## 2015-10-24 DIAGNOSIS — R911 Solitary pulmonary nodule: Secondary | ICD-10-CM

## 2015-10-24 DIAGNOSIS — R918 Other nonspecific abnormal finding of lung field: Secondary | ICD-10-CM | POA: Diagnosis present

## 2015-10-24 DIAGNOSIS — I700 Atherosclerosis of aorta: Secondary | ICD-10-CM | POA: Insufficient documentation

## 2015-10-24 LAB — GLUCOSE, CAPILLARY: Glucose-Capillary: 99 mg/dL (ref 65–99)

## 2015-10-24 MED ORDER — FLUDEOXYGLUCOSE F - 18 (FDG) INJECTION
8.4000 | Freq: Once | INTRAVENOUS | Status: AC | PRN
  Administered 2015-10-24: 8.4 via INTRAVENOUS

## 2015-10-24 NOTE — Progress Notes (Signed)
[address]       [place] [address]             [phone]                    Candace Lewis [place] Medical Record [id number] Date of Birth: July 07, 1940  Referring: Everardo All, MD Primary Care: Octavio Graves, DO  Chief Complaint:    Chief Complaint  Patient presents with  . Follow-up    after PET and PFT    History of Present Illness:    Candace Lewis 75 y.o. female is seen in the office  today for slowly enlarging lesion in the lung of the left upper lobe. The patient has a complex history of present previous cancer of the head and neck. 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 Since that time she's continued to have feeding problems frequently vomiting and she says lives off mostly ensure with milk. Recently she's also had a flareup of diverticulitis and is on by mouth antibiotics for this. She'd had a gastrostomy tube in the past but this has been removed.  She now comes in at the request of Dr. Abran Duke because of a semisolid left apical pulmonary nodule that previously had measured 2 x 1.5 cm and now measures 2.9 x 2.9 cm over a six-month period.   The patient has a previous history of smoking but notes she stopped in 1983. 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      Current Activity/ Functional Status:  Patient is independent with mobility/ambulation, transfers, ADL's, IADL's.   Zubrod Score: At the time of surgery this patient's most appropriate activity status/level should be described as: []     0    Normal activity, no symptoms [x]     1    Restricted in physical strenuous activity but ambulatory, able to do out light work []     2    Ambulatory and capable  of self care, unable to do work activities, up and about               >50 % of waking hours                              []     3    Only limited self care, in bed greater than 50% of waking hours []     4    Completely disabled, no self care, confined to bed or chair []     5    Moribund   Past Medical History   Hypertension    High cholesterol    High triglycerides    Squamous cell cancer of tongue (*)    Colon cancer (*)  cancer tongue  Right ear pain August 2015 since diagnosed with cancer tongue  Hypokalemia    Thrush of mouth and esophagus (*)  Dr Lisbeth Renshaw examined Radiation visit. Script Diflucan given.  Diverticulitis      Past Surgical History  Procedure Laterality Date  . Tubal ligation  1976  . Mastectomy, partial  1984  . Biopsy pharynx  2015  . Vaginal hysterectomy  1981    vaginal  hyst per patient report  . Cholecystectomy  2005  . Tonsillectomy  1950    per patient report  . Skin surgery  1960    moles removed from various areas per patient report   [place]  mole removal  [place], PARTIAL 1984    GALLBLADDER SURGERY 2005    EMPHYSEMA     PEG TUBE PLACEMENT 01/29/2014    PORTACATH PLACEMENT 01/29/2014       Family History: Patient's mother died of COPD and heart failure, father died of diabetes  Social History   Social History  . Marital Status: Married    Spouse Name: N/A  . Number of Children: N/A  . Years of Education: N/A   Occupational History  . 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   Social History Main Topics  . Smoking status: Former Smoker -- 1.00 packs/day for 23 years    Types: Cigarettes    Quit date: 05/03/1982  . Smokeless tobacco: Never Used  . Alcohol Use: Not on file  . Drug Use: Not on file   . Sexual Activity: Not on file     History  Smoking status  . Former Smoker -- 1.00 packs/day for 23 years  . Types: Cigarettes  . Quit date: 05/03/1982  Smokeless tobacco  . Never Used    History  Alcohol Use No     Allergies  Allergen Reactions  . Adhesive [Tape] Rash    Reports rash and blistering if any adhesive tape  . Latex Rash    Rash and skin blistering per patient report    Current Outpatient Prescriptions  Medication Sig Dispense Refill  . acetaminophen-codeine (TYLENOL #3) 300-30 MG tablet Take 1 tablet by mouth every 6 (six) hours as needed for moderate pain.    . clotrimazole (MYCELEX) 10 MG troche Take 10 mg by mouth 5 (five) times daily.    Marland Kitchen Dexlansoprazole 30 MG capsule Take by mouth.    . Homeopathic Products (ARNICA) GEL     . levothyroxine (SYNTHROID, LEVOTHROID) 25 MCG tablet Take 25 mcg by mouth daily before breakfast.    . promethazine (PHENERGAN) 25 MG tablet Take by mouth.     No current facility-administered medications for this visit.     Review of Systems:     Cardiac Review of Systems: Y or N  Chest Pain [  n  ]  Resting SOB [n   ] Exertional SOB  [ n ]  Orthopnea [n ]   Pedal Edema [ n  ]    Palpitations [ n ] Syncope  [ n ]   Presyncope [ n ]  General Review of Systems: [Y] = yes [  ]=no Constitional: recent weight change [  ];  Wt loss over the last 3 months [ n  ] anorexia [  ]; fatigue [  ]; nausea [  ]; night sweats [  ]; fever [  ]; or chills [  ];          Dental: poor dentition[ dentures ]; Last Dentist visit:   Eye : blurred vision [  ]; diplopia [   ]; vision changes [  ];  Amaurosis fugax[  ]; Resp: cough [  ];  wheezing[ y];  hemoptysis[  ];  shortness of breath[  ]; paroxysmal nocturnal dyspnea[  ]; dyspnea on exertion[  ]; or orthopnea[  ];  GI:  gallstones[  ], vomiting[ y ];  dysphagia[y  ]; melena[ n ];  hematochezia [n  ]; heartburn[  ];   Hx of  Colonoscopy[  ]; GU: kidney stones [  ]; hematuria[  ];   dysuria [  ];   nocturia[  ];  history of     obstruction [  ]; urinary frequency [  ]             Skin: rash, swelling[  ];, hair loss[  ];  peripheral edema[  ];  or itching[  ]; Musculosketetal: myalgias[  ];  joint swelling[  ];  joint erythema[  ];  joint pain[  ];  back pain[  ];  Heme/Lymph: bruising[  ];  bleeding[  ];  anemia[  ];  Neuro: TIA[  ];  headaches[  ];  stroke[  ];  vertigo[  ];  seizures[  ];   paresthesias[  ];  difficulty walking[  ];  Psych:depression[  ]; anxiety[  ];  Endocrine: diabetes[n  ];  thyroid dysfunction[  ];  Immunizations: Flu up to date [ y ]; Pneumococcal up to date [ n ];  Other:  Physical Exam: BP 110/76 mmHg  Pulse 97  Resp 16  Ht 5\' 7"  (1.702 m)  Wt 168 lb (76.204 kg)  BMI 26.31 kg/m2  SpO2 98%  PHYSICAL EXAMINATION: General appearance: alert, cooperative and no distress Head: Normocephalic, without obvious abnormality, atraumatic Neck: no adenopathy, no carotid bruit, no JVD, supple, symmetrical, trachea midline, thyroid not enlarged, symmetric, no tenderness/mass/nodules and There is thickening of the skin of the neck but node definite palpable cervical adenopathy or cervical mass Lymph nodes: Cervical, supraclavicular, and axillary nodes normal. Resp: clear to auscultation bilaterally Back: symmetric, no curvature. ROM normal. No CVA tenderness. Cardio: regular rate and rhythm, S1, S2 normal, no murmur, click, rub or gallop GI: soft, non-tender; bowel sounds normal; no masses,  no organomegaly Extremities: extremities normal, atraumatic, no cyanosis or edema and Homans sign is negative, no sign of DVT Neurologic: Grossly normal  Diagnostic Studies & Laboratory data:     Recent Radiology Findings:  Nm Pet Image Initial (pi) Skull Base To Thigh  10/24/2015  CLINICAL DATA:  Subsequent treatment strategy for lung mass. History of tongue cancer. EXAM: NUCLEAR MEDICINE PET SKULL BASE TO THIGH TECHNIQUE: 8.4 mCi F-18 FDG was injected intravenously.  Full-ring PET imaging was performed from the skull base to thigh after the radiotracer. CT data was obtained and used for attenuation correction and anatomic localization. FASTING BLOOD GLUCOSE:  Value: 99 mg/dl COMPARISON:  Chest CT 10/07/2015. Abdominal pelvic CT of 09/18/2015. Most recent PET of 07/03/2014. FINDINGS: NECK Extensive muscular activity throughout the neck. Given this factor, no cervical hypermetabolic nodes identified. Relatively diffuse tongue hypermetabolism is without CT correlate and most likely due to motion after radiopharmaceutical injection. Similarly, hypermetabolism about the posterior aspect of the larynx is symmetric and likely physiologic. CHEST Low-level hypermetabolism which corresponds to the sub solid left apical pulmonary nodule. This measures 2.8 cm and a S.U.V. max of 1.7 on image 14/series 8. compare a S.U.V. max of 2.2 on the 07/03/2014 exam. No thoracic nodal hypermetabolism. ABDOMEN/PELVIS No areas of abnormal hypermetabolism. SKELETON Focus of hypermetabolism about the anterior right acetabulum is favored to be related to tendinous strain at the origin of thigh extensor musculature. This measures a S.U.V. max of  5.8. Similarly, there is hypermetabolism about the anterior left femoral head/ neck junction which is favored to be due to adjacent muscular strain. No well-defined osseous lesion in either site. CT IMAGES PERFORMED FOR ATTENUATION CORRECTION No cervical adenopathy. Bilateral carotid atherosclerosis. Thoracic aortic and branch vessel atherosclerosis. LAD coronary artery atherosclerosis. Chest findings deferred to recent diagnostic CT. Lower pole right renal fluid density lesion is likely a cyst. Abdominal aortic atherosclerosis. Cholecystectomy. Hysterectomy. IMPRESSION: 1. Low-level, non malignant range hypermetabolism corresponding to the left apical sub solid pulmonary nodule. This remains suspicious for low-grade adenocarcinoma, given morphology and interval  enlargement on prior diagnostic CT. 2. Diffuse tongue hypermetabolism without CT correlate. Recommend physical exam correlation to confirm physiologic or related to motion after pharmaceutical injection. 3. No evidence of hypermetabolic nodes to suggest metastatic disease. 4.  Coronary artery atherosclerosis. Aortic atherosclerosis. Electronically Signed   By: Abigail Miyamoto M.D.   On: 10/24/2015 10:09      CLINICAL DATA: Followup of pulmonary nodule. Throat cancer in 2015 with chemotherapy and radiation therapy.  EXAM: CT CHEST WITHOUT CONTRAST  TECHNIQUE: Multidetector CT imaging of the chest was performed following the standard protocol without IV contrast.  COMPARISON: Chest radiograph 09/18/2015. Abdominal pelvic CT 09/18/2015. Chest CT 04/05/2015.  FINDINGS: Mediastinum/Nodes: Aortic and branch vessel atherosclerosis. Normal heart size, without pericardial effusion. LAD coronary artery atherosclerosis. No mediastinal or definite hilar adenopathy, given limitations of unenhanced CT.  Lungs/Pleura: No pleural fluid. Biapical pleural-parenchymal scarring. A 3 mm right upper lobe pulmonary nodule on image 29/series 4 is felt to be similar to on the prior exam. There are smaller right apical pulmonary nodules, including at 3 mm on image 30/ series 4, which are likely more apparent today secondary to differences in slice thickness.  A 3 mm right lower lobe subpleural nodule on image 69/series 4 similar.  Sub solid left apical pulmonary nodule measures 2.9 x 2.9 cm on image 30/series 4. Compare 2.0 x 1.5 cm on the most recent exam. The solid components measure on the order of 8 mm on image 27/series 4 and are also felt to be increased.  Upper abdomen: Cholecystectomy. Normal imaged portions of the liver, spleen, stomach, pancreas, adrenal glands, kidneys.  Musculoskeletal: Nodularity in the lateral left breast measures 1.3 cm on image 70/series 2 and is unchanged. No acute  osseous abnormality.  IMPRESSION: 1. Increase in size of a sub solid left apical pulmonary nodule. This remains suspicious for a low-grade adenocarcinoma, which had regressed on the prior exam secondary to chemotherapy. 2. Other pulmonary nodules are primarily similar. Some right apical nodules are more conspicuous today, favored to be due to differences in slice thickness. 3. Atherosclerosis, including within the coronary arteries. 4. Similar left breast nodule which could be re-evaluated at followup or more entirely characterized with mammogram/ultrasound. 5. No thoracic adenopathy to suggest nodal metastasis.   Electronically Signed By: Abigail Miyamoto M.D. On: 10/07/2015 14:02  Final Report  CLINICAL DATA: 75 year old female with history of multiple pulmonary nodules. Followup study. Additional history of tongue cancer.  EXAM: CT CHEST WITH CONTRAST  TECHNIQUE: Multidetector CT imaging of the chest was performed during intravenous contrast administration.  CONTRAST: 60 mL of Isovue 370.  COMPARISON: Chest CT 01/04/2015. PET-CT 07/03/2014.  FINDINGS: Mediastinum/Lymph Nodes: Heart size is normal. There is no significant pericardial fluid, thickening or pericardial calcification. There is atherosclerosis of the thoracic aorta, the great vessels of the mediastinum and the coronary arteries, including calcified atherosclerotic plaque in the left anterior descending coronary artery.  No pathologically enlarged mediastinal or hilar lymph nodes. Esophagus is unremarkable in appearance. No axillary lymphadenopathy. Right internal jugular single-lumen porta cath with tip terminating in the distal superior vena cava.  Lungs/Pleura: Again noted is an ill-defined nodular area in the apex of the left upper lobe, which is predominantly ground-glass attenuation (image 15 of series 4) measuring 2.0 x 1.5 cm (slightly smaller than prior study 01/04/2015 at which point this measured  2.1 x 2.2 cm), with a tiny central solid component measuring only 4 mm (image 15 of series 2), which is also slightly less apparent than prior studies. No other new suspicious appearing pulmonary nodules or masses are identified. The no acute consolidative airspace disease. No pleural effusions.  Upper Abdomen: Status post cholecystectomy.  Musculoskeletal/Soft Tissues: There are no aggressive appearing lytic or blastic lesions noted in the visualized portions of the skeleton.  IMPRESSION: 1. Previously described mixed solid and sub solid nodule in the apex of the left upper lobe is less apparent than prior examinations. Although the regression is reassuring, based on behavior on prior examinations, this remains concerning for potential low-grade adenocarcinoma, and the regression may simply reflect response to chemotherapy for the patient's head neck cancer. Continued attention on future followup examinations is recommended. 2. No other suspicious appearing pulmonary nodules or masses in the lungs at this time to suggest metastatic disease. 3. Atherosclerosis, including left anterior descending coronary artery disease. Please note that although the presence of coronary artery calcium documents the presence of coronary artery disease, the severity of this disease and any potential stenosis cannot be assessed on this non-gated CT examination. Assessment for potential risk factor modification, dietary therapy or pharmacologic therapy may be warranted, if clinically indicated. 4. Additional incidental findings, as above.   Electronically Signed By: Vinnie Langton M.D. On: 04/05/2015 13:17    Recent Lab Findings: No results found for: WBC, HGB, HCT, PLT, GLUCOSE, CHOL, TRIG, HDL, LDLDIRECT, LDLCALC, ALT, AST, NA, K, CL, CREATININE, BUN, CO2, TSH, INR, GLUF, HGBA1C PFT's :  FEV1 2.10 85% DLCO 16.75 59%  1. spirometry shows minimal airflow obstruction without bronchodilator  improvement. 2.lung volumes are normal. 3.airway resistance is normal. 4. DLCO moderately reduced   Assessment / Plan:   1/ History of squamous cell carcinoma of the tongue, treated with radiation and chemotherapy 2015 2/ Increase in size of a sub solid left apical pulmonary nodule suspicious for a low-grade adenocarcinoma, which had regressed on the prior exam secondary    to chemotherapy. 3/ Atherosclerosis, including left anterior descending coronary     artery disease.- by ct   With patient's previous history of advanced stage squamous cell carcinoma of the tongue we will plan to proceed with navigation bronchoscopy and biopsy, a super D CT scan is required because the scan done at [facility] has had the raw data CT DATA for navigation  Bronchoscopy arranged . The PET scan, shows no evidence of widespread metastatic disease. I discussed with the patient proceeding directly with surgical resection versus navigation bronchoscopy and biopsy, she is very concerned about surgery because of her previous neck tongue carcinoma and difficulties with swallowing. We'll attentively plan bronchoscopy with navigation bronchoscopy and biopsy and possible placement of fiducial markers on January 27.    Grace Isaac MD      [place] [place] [address] Office [phone]   Beeper [phone]  10/24/2015 2:21 PM

## 2015-10-25 ENCOUNTER — Encounter (HOSPITAL_COMMUNITY): Payer: Self-pay | Admitting: *Deleted

## 2015-10-25 NOTE — Progress Notes (Signed)
Pt denies cardiac history, chest pain or sob. 

## 2015-10-28 ENCOUNTER — Encounter (HOSPITAL_COMMUNITY)
Admission: RE | Disposition: A | Discharge status: Home / Self Care | Payer: Self-pay | Source: Ambulatory Visit | Attending: Cardiothoracic Surgery

## 2015-10-28 ENCOUNTER — Encounter (HOSPITAL_COMMUNITY): Payer: Self-pay | Admitting: *Deleted

## 2015-10-28 ENCOUNTER — Ambulatory Visit (HOSPITAL_COMMUNITY): Payer: Medicare Other

## 2015-10-28 ENCOUNTER — Ambulatory Visit (HOSPITAL_COMMUNITY): Payer: Medicare Other | Admitting: Certified Registered Nurse Anesthetist

## 2015-10-28 ENCOUNTER — Ambulatory Visit (HOSPITAL_COMMUNITY)
Admission: RE | Admit: 2015-10-28 | Discharge: 2015-10-28 | Disposition: A | Discharge status: Home / Self Care | Payer: Medicare Other | Source: Ambulatory Visit | Attending: Cardiothoracic Surgery | Admitting: Cardiothoracic Surgery

## 2015-10-28 DIAGNOSIS — Z923 Personal history of irradiation: Secondary | ICD-10-CM | POA: Diagnosis not present

## 2015-10-28 DIAGNOSIS — R911 Solitary pulmonary nodule: Secondary | ICD-10-CM | POA: Insufficient documentation

## 2015-10-28 DIAGNOSIS — I10 Essential (primary) hypertension: Secondary | ICD-10-CM | POA: Diagnosis not present

## 2015-10-28 DIAGNOSIS — Z79899 Other long term (current) drug therapy: Secondary | ICD-10-CM | POA: Diagnosis not present

## 2015-10-28 DIAGNOSIS — I2510 Atherosclerotic heart disease of native coronary artery without angina pectoris: Secondary | ICD-10-CM | POA: Insufficient documentation

## 2015-10-28 DIAGNOSIS — Z9221 Personal history of antineoplastic chemotherapy: Secondary | ICD-10-CM | POA: Insufficient documentation

## 2015-10-28 DIAGNOSIS — K219 Gastro-esophageal reflux disease without esophagitis: Secondary | ICD-10-CM | POA: Insufficient documentation

## 2015-10-28 DIAGNOSIS — Z87891 Personal history of nicotine dependence: Secondary | ICD-10-CM | POA: Diagnosis not present

## 2015-10-28 DIAGNOSIS — Z85810 Personal history of malignant neoplasm of tongue: Secondary | ICD-10-CM | POA: Diagnosis not present

## 2015-10-28 DIAGNOSIS — Z85038 Personal history of other malignant neoplasm of large intestine: Secondary | ICD-10-CM | POA: Insufficient documentation

## 2015-10-28 DIAGNOSIS — Z09 Encounter for follow-up examination after completed treatment for conditions other than malignant neoplasm: Secondary | ICD-10-CM

## 2015-10-28 HISTORY — DX: Unspecified osteoarthritis, unspecified site: M19.90

## 2015-10-28 HISTORY — DX: Constipation, unspecified: K59.00

## 2015-10-28 HISTORY — DX: Headache, unspecified: R51.9

## 2015-10-28 HISTORY — PX: FUDUCIAL PLACEMENT: SHX5083

## 2015-10-28 HISTORY — PX: VIDEO BRONCHOSCOPY WITH ENDOBRONCHIAL NAVIGATION: SHX6175

## 2015-10-28 HISTORY — DX: Headache: R51

## 2015-10-28 HISTORY — DX: Essential (primary) hypertension: I10

## 2015-10-28 HISTORY — DX: Gastro-esophageal reflux disease without esophagitis: K21.9

## 2015-10-28 HISTORY — DX: Diverticulitis of intestine, part unspecified, without perforation or abscess without bleeding: K57.92

## 2015-10-28 HISTORY — DX: Unspecified macular degeneration: H35.30

## 2015-10-28 LAB — CBC
HCT: 40.2 % (ref 36.0–46.0)
Hemoglobin: 13.6 g/dL (ref 12.0–15.0)
MCH: 28.1 pg (ref 26.0–34.0)
MCHC: 33.8 g/dL (ref 30.0–36.0)
MCV: 83.1 fL (ref 78.0–100.0)
Platelets: 129 10*3/uL — ABNORMAL LOW (ref 150–400)
RBC: 4.84 MIL/uL (ref 3.87–5.11)
RDW: 14.1 % (ref 11.5–15.5)
WBC: 2.6 10*3/uL — ABNORMAL LOW (ref 4.0–10.5)

## 2015-10-28 LAB — COMPREHENSIVE METABOLIC PANEL
ALT: 18 U/L (ref 14–54)
AST: 21 U/L (ref 15–41)
Albumin: 3.5 g/dL (ref 3.5–5.0)
Alkaline Phosphatase: 57 U/L (ref 38–126)
Anion gap: 9 (ref 5–15)
BUN: 17 mg/dL (ref 6–20)
CO2: 26 mmol/L (ref 22–32)
Calcium: 9.5 mg/dL (ref 8.9–10.3)
Chloride: 104 mmol/L (ref 101–111)
Creatinine, Ser: 0.84 mg/dL (ref 0.44–1.00)
GFR calc Af Amer: >60 mL/min (ref >60)
GFR calc non Af Amer: >60 mL/min (ref >60)
Glucose, Bld: 103 mg/dL — ABNORMAL HIGH (ref 65–99)
Potassium: 4 mmol/L (ref 3.5–5.1)
Sodium: 139 mmol/L (ref 135–145)
Total Bilirubin: 0.4 mg/dL (ref 0.3–1.2)
Total Protein: 6.2 g/dL — ABNORMAL LOW (ref 6.5–8.1)

## 2015-10-28 LAB — APTT: aPTT: 27 seconds (ref 24–37)

## 2015-10-28 LAB — PROTIME-INR
INR: 1 (ref 0.00–1.49)
Prothrombin Time: 13.4 seconds (ref 11.6–15.2)

## 2015-10-28 SURGERY — VIDEO BRONCHOSCOPY WITH ENDOBRONCHIAL NAVIGATION
Anesthesia: General | Site: Chest

## 2015-10-28 MED ORDER — PROTAMINE SULFATE 10 MG/ML IV SOLN
INTRAVENOUS | Status: AC
  Filled 2015-10-28: qty 25

## 2015-10-28 MED ORDER — FENTANYL CITRATE (PF) 100 MCG/2ML IJ SOLN
INTRAMUSCULAR | Status: DC | PRN
  Administered 2015-10-28: 100 ug via INTRAVENOUS
  Administered 2015-10-28: 50 ug via INTRAVENOUS

## 2015-10-28 MED ORDER — SODIUM CHLORIDE 0.9 % IJ SOLN
INTRAMUSCULAR | Status: AC
  Filled 2015-10-28: qty 30

## 2015-10-28 MED ORDER — DEXTROSE 5 % IV SOLN
10.0000 mg | INTRAVENOUS | Status: DC | PRN
  Administered 2015-10-28: 50 ug/min via INTRAVENOUS

## 2015-10-28 MED ORDER — FENTANYL CITRATE (PF) 250 MCG/5ML IJ SOLN
INTRAMUSCULAR | Status: AC
  Filled 2015-10-28: qty 5

## 2015-10-28 MED ORDER — PROPOFOL 10 MG/ML IV BOLUS
INTRAVENOUS | Status: DC | PRN
  Administered 2015-10-28: 120 mg via INTRAVENOUS

## 2015-10-28 MED ORDER — LACTATED RINGERS IV SOLN
INTRAVENOUS | Status: DC | PRN
  Administered 2015-10-28: 13:00:00 via INTRAVENOUS

## 2015-10-28 MED ORDER — SUCCINYLCHOLINE CHLORIDE 200 MG/10ML IV SOSY
PREFILLED_SYRINGE | INTRAVENOUS | Status: DC | PRN
  Administered 2015-10-28: 140 mg via INTRAVENOUS

## 2015-10-28 MED ORDER — LIDOCAINE 2% (20 MG/ML) 5 ML SYRINGE
INTRAMUSCULAR | Status: DC | PRN
  Administered 2015-10-28: 100 mg via INTRAVENOUS

## 2015-10-28 MED ORDER — ROCURONIUM BROMIDE 50 MG/5ML IV SOLN
INTRAVENOUS | Status: AC
  Filled 2015-10-28: qty 1

## 2015-10-28 MED ORDER — MIDAZOLAM HCL 2 MG/2ML IJ SOLN
INTRAMUSCULAR | Status: AC
  Filled 2015-10-28: qty 2

## 2015-10-28 MED ORDER — PROPOFOL 10 MG/ML IV BOLUS
INTRAVENOUS | Status: AC
  Filled 2015-10-28: qty 20

## 2015-10-28 MED ORDER — MEPERIDINE HCL 25 MG/ML IJ SOLN: 6.2500 mg | INTRAMUSCULAR | Status: DC | PRN

## 2015-10-28 MED ORDER — HEPARIN SODIUM (PORCINE) 1000 UNIT/ML IJ SOLN
INTRAMUSCULAR | Status: AC
  Filled 2015-10-28: qty 1

## 2015-10-28 MED ORDER — ONDANSETRON HCL 4 MG/2ML IJ SOLN
INTRAMUSCULAR | Status: AC
  Filled 2015-10-28: qty 2

## 2015-10-28 MED ORDER — ROCURONIUM BROMIDE 100 MG/10ML IV SOLN
INTRAVENOUS | Status: DC | PRN
  Administered 2015-10-28: 30 mg via INTRAVENOUS
  Administered 2015-10-28: 10 mg via INTRAVENOUS

## 2015-10-28 MED ORDER — MIDAZOLAM HCL 5 MG/5ML IJ SOLN
INTRAMUSCULAR | Status: DC | PRN
  Administered 2015-10-28 (×2): 1 mg via INTRAVENOUS

## 2015-10-28 MED ORDER — VECURONIUM BROMIDE 10 MG IV SOLR
INTRAVENOUS | Status: AC
  Filled 2015-10-28: qty 30

## 2015-10-28 MED ORDER — HEPARIN SODIUM (PORCINE) 1000 UNIT/ML IJ SOLN
INTRAMUSCULAR | Status: AC
  Filled 2015-10-28: qty 3

## 2015-10-28 MED ORDER — HYDROMORPHONE HCL 1 MG/ML IJ SOLN: 0.2500 mg | INTRAMUSCULAR | Status: DC | PRN

## 2015-10-28 MED ORDER — PHENYLEPHRINE 40 MCG/ML (10ML) SYRINGE FOR IV PUSH (FOR BLOOD PRESSURE SUPPORT)
PREFILLED_SYRINGE | INTRAVENOUS | Status: DC | PRN
  Administered 2015-10-28 (×4): 80 ug via INTRAVENOUS

## 2015-10-28 MED ORDER — SUGAMMADEX SODIUM 200 MG/2ML IV SOLN
INTRAVENOUS | Status: DC | PRN
  Administered 2015-10-28: 152.4 mg via INTRAVENOUS

## 2015-10-28 MED ORDER — LACTATED RINGERS IV SOLN
Freq: Once | INTRAVENOUS | Status: AC
  Administered 2015-10-28: 11:00:00 via INTRAVENOUS

## 2015-10-28 MED ORDER — ONDANSETRON HCL 4 MG/2ML IJ SOLN
INTRAMUSCULAR | Status: DC | PRN
  Administered 2015-10-28: 4 mg via INTRAVENOUS

## 2015-10-28 MED ORDER — PROMETHAZINE HCL 25 MG/ML IJ SOLN
6.2500 mg | INTRAMUSCULAR | Status: DC | PRN
Start: 2015-10-28 — End: 2015-10-28

## 2015-10-28 MED ORDER — SOD CITRATE-CITRIC ACID 500-334 MG/5ML PO SOLN
30.0000 mL | Freq: Once | ORAL | Status: AC
  Administered 2015-10-28: 30 mL via ORAL
  Filled 2015-10-28: qty 15

## 2015-10-28 SURGICAL SUPPLY — 33 items
BRUSH BIOPSY BRONCH 10 SDTNB (MISCELLANEOUS) ×2 IMPLANT
BRUSH SUPERTRAX BIOPSY (INSTRUMENTS) IMPLANT
BRUSH SUPERTRAX NDL-TIP CYTO (INSTRUMENTS) ×2 IMPLANT
CANISTER SUCTION 2500CC (MISCELLANEOUS) ×2 IMPLANT
CHANNEL WORK EXTEND EDGE 180 (KITS) IMPLANT
CHANNEL WORK EXTEND EDGE 45 (KITS) IMPLANT
CHANNEL WORK EXTEND EDGE 90 (KITS) IMPLANT
CONT SPEC 4OZ CLIKSEAL STRL BL (MISCELLANEOUS) ×4 IMPLANT
COVER TABLE BACK 60X90 (DRAPES) ×2 IMPLANT
DRSG AQUACEL AG ADV 3.5X14 (GAUZE/BANDAGES/DRESSINGS) ×2 IMPLANT
FILTER STRAW FLUID ASPIR (MISCELLANEOUS) IMPLANT
FORCEPS BIOP SUPERTRX PREMAR (INSTRUMENTS) ×2 IMPLANT
GAUZE SPONGE 4X4 12PLY STRL (GAUZE/BANDAGES/DRESSINGS) ×2 IMPLANT
GLOVE BIO SURGEON STRL SZ 6.5 (GLOVE) ×2 IMPLANT
GLOVE SURG SS PI 6.5 STRL IVOR (GLOVE) ×6 IMPLANT
KIT CLEAN ENDO COMPLIANCE (KITS) ×2 IMPLANT
KIT MARKER FIDUCIAL DELIVERY (KITS) ×2 IMPLANT
KIT PROCEDURE EDGE 180 (KITS) ×2 IMPLANT
KIT ROOM TURNOVER OR (KITS) ×2 IMPLANT
MARKER FIDUCIAL SL NIT COIL (Implant Marker) ×6 IMPLANT
MARKER SKIN DUAL TIP RULER LAB (MISCELLANEOUS) ×2 IMPLANT
NEEDLE SUPERTRX PREMARK BIOPSY (NEEDLE) ×2 IMPLANT
NS IRRIG 1000ML POUR BTL (IV SOLUTION) ×2 IMPLANT
OIL SILICONE PENTAX (PARTS (SERVICE/REPAIRS)) ×2 IMPLANT
PAD ARMBOARD 7.5X6 YLW CONV (MISCELLANEOUS) ×4 IMPLANT
PATCHES PATIENT (LABEL) ×6 IMPLANT
SYR 20CC LL (SYRINGE) IMPLANT
SYR 20ML ECCENTRIC (SYRINGE) ×2 IMPLANT
TOWEL OR 17X24 6PK STRL BLUE (TOWEL DISPOSABLE) ×2 IMPLANT
TRAP SPECIMEN MUCOUS 40CC (MISCELLANEOUS) ×2 IMPLANT
TUBE CONNECTING 20X1/4 (TUBING) ×2 IMPLANT
UNDERPAD 30X30 (UNDERPADS AND DIAPERS) ×2 IMPLANT
WATER STERILE IRR 1000ML POUR (IV SOLUTION) ×2 IMPLANT

## 2015-10-28 NOTE — Transfer of Care (Signed)
Immediate Anesthesia Transfer of Care Note  Patient: Candace Lewis  Procedure(s) Performed: Procedure(s): VIDEO BRONCHOSCOPY WITH ENDOBRONCHIAL NAVIGATION (N/A) PLACEMENT OF [place]; LEFT UPPER LOBE BIOPSY (N/A)  Patient Location: PACU  Anesthesia Type:General  Level of Consciousness: awake, alert , oriented and patient cooperative  Airway & Oxygen Therapy: Patient Spontanous Breathing and Patient connected to nasal cannula oxygen  Post-op Assessment: Report given to RN and Post -op Vital signs reviewed and stable  Post vital signs: Reviewed and stable  Last Vitals:  Filed Vitals:   10/28/15 1508 10/28/15 1515  BP: 120/58   Pulse: 81 84  Temp:    Resp: 11 15    Last Pain: There were no vitals filed for this visit.       Complications: No apparent anesthesia complications

## 2015-10-28 NOTE — Anesthesia Postprocedure Evaluation (Signed)
Anesthesia Post Note  Patient: Candace Lewis  Procedure(s) Performed: Procedure(s) (LRB): VIDEO BRONCHOSCOPY WITH ENDOBRONCHIAL NAVIGATION (N/A) PLACEMENT OF FUDUCIAL TIMES THREE; LEFT UPPER LOBE BIOPSY (N/A)  Patient location during evaluation: PACU Anesthesia Type: General Level of consciousness: awake and alert and patient cooperative Pain management: pain level controlled Vital Signs Assessment: post-procedure vital signs reviewed and stable Respiratory status: spontaneous breathing and respiratory function stable Cardiovascular status: stable Anesthetic complications: no    Last Vitals:  Filed Vitals:   10/28/15 1550 10/28/15 1615  BP: 135/73 131/81  Pulse: 72 68  Temp:  36.4 C  Resp: 23 17    Last Pain:  Filed Vitals:   10/28/15 1620  PainSc: 0-No pain                 Reonna Finlayson S

## 2015-10-28 NOTE — H&P (Signed)
[address]       [place] [address]             [phone]                    Candace Lewis [place] Medical Record [id number] Date of Birth: 16-Jul-1940  Referring: No ref. provider found Primary Care: Octavio Graves, DO  Chief Complaint:    No chief complaint on file.   History of Present Illness:    Candace Lewis 75 y.o. female is seen in the office   for slowly enlarging lesion in the lung of the left upper lobe. The patient has a complex history of present previous cancer of the head and neck. 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 Since that time she's continued to have feeding problems frequently vomiting and she says lives off mostly ensure with milk. Recently she's also had a flareup of diverticulitis and is on by mouth antibiotics for this. She'd had a gastrostomy tube in the past but this has been removed.  She now comes in at the request of Dr. Abran Duke because of a semisolid left apical pulmonary nodule that previously had measured 2 x 1.5 cm and now measures 2.9 x 2.9 cm over a six-month period.   The patient has a previous history of smoking but notes she stopped in 1983. 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      Current Activity/ Functional Status:  Patient is independent with mobility/ambulation, transfers, ADL's, IADL's.   Zubrod Score: At the time of surgery this patient's most appropriate activity status/level should be described as: []     0    Normal activity, no symptoms [x]     1    Restricted in physical strenuous activity but ambulatory, able to do out light work []     2    Ambulatory and capable of self care, unable to do work activities, up and  about               >50 % of waking hours                              []     3    Only limited self care, in bed greater than 50% of waking hours []     4    Completely disabled, no self care, confined to bed or chair []     5    Moribund   Past Medical History   Hypertension    High cholesterol    High triglycerides    Squamous cell cancer of tongue (*)    Colon cancer (*)  cancer tongue  Right ear pain August 2015 since diagnosed with cancer tongue  Hypokalemia    Thrush of mouth and esophagus (*)  Dr Lisbeth Renshaw examined Radiation visit. Script Diflucan given.  Diverticulitis      Past Surgical History  Procedure Laterality Date  . Tubal ligation  1976  . Mastectomy, partial  1984  . Biopsy pharynx  2015  . Vaginal hysterectomy  1981    vaginal hyst per patient report  . Cholecystectomy  2005  .  Tonsillectomy  1950    per patient report  . Skin surgery  1960    moles removed from various areas per patient report   [place]  mole removal  [place], PARTIAL 1984    GALLBLADDER SURGERY 2005    EMPHYSEMA     PEG TUBE PLACEMENT 01/29/2014    PORTACATH PLACEMENT 01/29/2014       Family History: Patient's mother died of COPD and heart failure, father died of diabetes  Social History   Social History  . Marital Status: Married    Spouse Name: N/A  . Number of Children: N/A  . Years of Education: N/A   Occupational History  . 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   Social History Main Topics  . Smoking status: Former Smoker -- 1.00 packs/day for 23 years    Types: Cigarettes    Quit date: 05/03/1982  . Smokeless tobacco: Never Used  . Alcohol Use: Not on file  . Drug Use: Not on file  . Sexual Activity: Not on file     History    Smoking status  . Former Smoker -- 1.00 packs/day for 23 years  . Types: Cigarettes  . Quit date: 05/03/1982  Smokeless tobacco  . Never Used    History  Alcohol Use No     Allergies  Allergen Reactions  . Adhesive [Tape] Rash    Reports rash and blistering if any adhesive tape  . Latex Rash    Rash and skin blistering per patient report    No current facility-administered medications for this encounter.     Review of Systems:     Cardiac Review of Systems: Y or N  Chest Pain [  n  ]  Resting SOB [n   ] Exertional SOB  [ n ]  Orthopnea [n ]   Pedal Edema [ n  ]    Palpitations [ n ] Syncope  [ n ]   Presyncope [ n ]  General Review of Systems: [Y] = yes [  ]=no Constitional: recent weight change [  ];  Wt loss over the last 3 months [ n  ] anorexia [  ]; fatigue [  ]; nausea [  ]; night sweats [  ]; fever [  ]; or chills [  ];          Dental: poor dentition[ dentures ]; Last Dentist visit:   Eye : blurred vision [  ]; diplopia [   ]; vision changes [  ];  Amaurosis fugax[  ]; Resp: cough [  ];  wheezing[ y];  hemoptysis[  ]; shortness of breath[  ]; paroxysmal nocturnal dyspnea[  ]; dyspnea on exertion[  ]; or orthopnea[  ];  GI:  gallstones[  ], vomiting[ y ];  dysphagia[y  ]; melena[ n ];  hematochezia [n  ]; heartburn[  ];   Hx of  Colonoscopy[  ]; GU: kidney stones [  ]; hematuria[  ];   dysuria [  ];  nocturia[  ];  history of     obstruction [  ]; urinary frequency [  ]             Skin: rash, swelling[  ];, hair loss[  ];  peripheral edema[  ];  or itching[  ]; Musculosketetal: myalgias[  ];  joint swelling[  ];  joint erythema[  ];  joint pain[  ];  back pain[  ];  Heme/Lymph: bruising[  ];  bleeding[  ];  anemia[  ];  Neuro: TIA[  ];  headaches[  ];  stroke[  ];  vertigo[  ];  seizures[  ];   paresthesias[  ];  difficulty walking[  ];  Psych:depression[  ]; anxiety[  ];  Endocrine: diabetes[n  ];  thyroid dysfunction[  ];  Immunizations: Flu up to date [ y ];  Pneumococcal up to date [ n ];  Other:  Physical Exam: BP 130/77 mmHg  Pulse 105  Temp(Src) 98.3 F (36.8 C) (Oral)  Resp 16  Ht 5\' 7"  (1.702 m)  Wt 168 lb (76.204 kg)  BMI 26.31 kg/m2  SpO2 100%  PHYSICAL EXAMINATION: General appearance: alert, cooperative and no distress Head: Normocephalic, without obvious abnormality, atraumatic Neck: no adenopathy, no carotid bruit, no JVD, supple, symmetrical, trachea midline, thyroid not enlarged, symmetric, no tenderness/mass/nodules and There is thickening of the skin of the neck but node definite palpable cervical adenopathy or cervical mass Lymph nodes: Cervical, supraclavicular, and axillary nodes normal. Resp: clear to auscultation bilaterally Back: symmetric, no curvature. ROM normal. No CVA tenderness. Cardio: regular rate and rhythm, S1, S2 normal, no murmur, click, rub or gallop GI: soft, non-tender; bowel sounds normal; no masses,  no organomegaly Extremities: extremities normal, atraumatic, no cyanosis or edema and Homans sign is negative, no sign of DVT Neurologic: Grossly normal  Diagnostic Studies & Laboratory data:     Recent Radiology Findings:  Dg Chest 2 View  10/28/2015  CLINICAL DATA:  Esophageal cancer.  Left upper lung biopsy, preop EXAM: CHEST  2 VIEW COMPARISON:  CT 10/24/2015 FINDINGS: Hazy opacity noted over the left apex may correspond to the ground-glass density in the left upper lobe seen on prior CT. Lungs are otherwise clear. Heart is normal size. No effusions or acute bony abnormality. IMPRESSION: Hazy opacity projects over the left apex. Otherwise unremarkable chest. Electronically Signed   By: Rolm Baptise M.D.   On: 10/28/2015 10:06   Ct Super D Chest Wo Contrast  10/24/2015  CLINICAL DATA:  Followup lung mass 2015. History of partial mastectomy in 1984. History of esophageal cancer 2015. Former smoker. Preop for lung mass. EXAM: CT CHEST WITHOUT CONTRAST TECHNIQUE: Multidetector CT imaging of the chest was  performed using thin slice collimation for electromagnetic bronchoscopy planning purposes, without intravenous contrast. COMPARISON:  PET-CT 10/22/2015, CT 10/07/2015, PET-CT 07/03/2014 FINDINGS: Mediastinum/Lymph Nodes: No axillary or supraclavicular lymphadenopathy. No mediastinal or hilar lymphadenopathy. No pericardial fluid. Normal esophagus Lungs/Pleura: Ground-glass nodule in the last upper lobe (image 26, series 4) measures 2.4 cm compared to 2.4 cm. No new pulmonary nodules. Upper abdomen: Adrenal glands are normal. No upper abdominal adenopathy. Musculoskeletal: No aggressive osseous lesion IMPRESSION: 1. Stable ground-glass nodule Lat upper lobe. Concern remains for low-grade adenocarcinoma (atypical adenomatous hyperplasia). 2. No adenopathy. Electronically Signed   By: Suzy Bouchard M.D.   On: 10/24/2015 17:33   Nm Pet Image Initial (pi) Skull Base To Thigh  10/24/2015  CLINICAL DATA:  Subsequent treatment strategy for lung mass. History of tongue cancer. EXAM: NUCLEAR MEDICINE PET SKULL BASE TO THIGH TECHNIQUE: 8.4 mCi F-18 FDG was injected intravenously. Full-ring PET imaging was performed from the skull base to thigh after the radiotracer. CT data was obtained and used for attenuation correction and anatomic localization. FASTING BLOOD GLUCOSE:  Value: 99 mg/dl COMPARISON:  Chest CT 10/07/2015. Abdominal pelvic CT of 09/18/2015. Most recent PET of 07/03/2014. FINDINGS: NECK Extensive muscular activity throughout the neck. Given this factor, no cervical hypermetabolic nodes identified. Relatively diffuse tongue hypermetabolism is without CT correlate and most likely due to motion after radiopharmaceutical injection. Similarly, hypermetabolism about the posterior aspect of the larynx is symmetric and likely physiologic. CHEST Low-level hypermetabolism which corresponds to the sub solid left apical pulmonary nodule. This measures 2.8 cm and a S.U.V. max of 1.7 on image 14/series 8. compare a  S.U.V. max of 2.2 on the 07/03/2014 exam. No thoracic nodal hypermetabolism. ABDOMEN/PELVIS No areas of abnormal hypermetabolism. SKELETON Focus of hypermetabolism about the anterior right acetabulum is favored to be related to tendinous strain at the origin of thigh extensor musculature. This measures a S.U.V. max of 5.8. Similarly, there is hypermetabolism about the anterior left femoral head/ neck junction which is favored to be due to adjacent muscular strain. No well-defined osseous lesion in either site. CT IMAGES PERFORMED FOR ATTENUATION CORRECTION No cervical adenopathy. Bilateral carotid atherosclerosis. Thoracic aortic and branch vessel atherosclerosis. LAD coronary artery atherosclerosis. Chest findings deferred to recent diagnostic CT. Lower pole right renal fluid density lesion is likely a cyst. Abdominal aortic atherosclerosis. Cholecystectomy. Hysterectomy. IMPRESSION: 1. Low-level, non malignant range hypermetabolism corresponding to the left apical sub solid pulmonary nodule. This remains suspicious for low-grade adenocarcinoma, given morphology and interval enlargement on prior diagnostic CT. 2. Diffuse tongue hypermetabolism without CT correlate. Recommend physical exam correlation to confirm physiologic or related to motion after pharmaceutical injection. 3. No evidence of hypermetabolic nodes to suggest metastatic disease. 4.  Coronary artery atherosclerosis. Aortic atherosclerosis. Electronically Signed   By: Abigail Miyamoto M.D.   On: 10/24/2015 10:09      CLINICAL DATA: Followup of pulmonary nodule. Throat cancer in 2015 with chemotherapy and radiation therapy.  EXAM: CT CHEST WITHOUT CONTRAST  TECHNIQUE: Multidetector CT imaging of the chest was performed following the standard protocol without IV contrast.  COMPARISON: Chest radiograph 09/18/2015. Abdominal pelvic CT 09/18/2015. Chest CT 04/05/2015.  FINDINGS: Mediastinum/Nodes: Aortic and branch vessel atherosclerosis.  Normal heart size, without pericardial effusion. LAD coronary artery atherosclerosis. No mediastinal or definite hilar adenopathy, given limitations of unenhanced CT.  Lungs/Pleura: No pleural fluid. Biapical pleural-parenchymal scarring. A 3 mm right upper lobe pulmonary nodule on image 29/series 4 is felt to be similar to on the prior exam. There are smaller right apical pulmonary nodules, including at 3 mm on image 30/ series 4, which are likely more apparent today secondary to differences in slice thickness.  A 3 mm right lower lobe subpleural nodule on image 69/series 4 similar.  Sub solid left apical pulmonary nodule measures 2.9 x 2.9 cm on image 30/series 4. Compare 2.0 x 1.5 cm on the most recent exam. The solid components measure on the order of 8 mm on image 27/series 4 and are also felt to be increased.  Upper abdomen: Cholecystectomy. Normal imaged portions of the liver, spleen, stomach, pancreas, adrenal glands, kidneys.  Musculoskeletal: Nodularity in the lateral left breast measures 1.3 cm on image 70/series 2 and is unchanged. No acute osseous abnormality.  IMPRESSION: 1. Increase in size of a sub solid left apical pulmonary nodule. This remains suspicious for a low-grade adenocarcinoma, which had regressed on the prior exam secondary to chemotherapy. 2. Other pulmonary nodules are primarily similar. Some right apical nodules are more conspicuous today, favored to be due to differences in slice thickness. 3. Atherosclerosis,  including within the coronary arteries. 4. Similar left breast nodule which could be re-evaluated at followup or more entirely characterized with mammogram/ultrasound. 5. No thoracic adenopathy to suggest nodal metastasis.   Electronically Signed By: Abigail Miyamoto M.D. On: 10/07/2015 14:02  Final Report  CLINICAL DATA: 75 year old female with history of multiple pulmonary nodules. Followup study. Additional history of  tongue cancer.  EXAM: CT CHEST WITH CONTRAST  TECHNIQUE: Multidetector CT imaging of the chest was performed during intravenous contrast administration.  CONTRAST: 60 mL of Isovue 370.  COMPARISON: Chest CT 01/04/2015. PET-CT 07/03/2014.  FINDINGS: Mediastinum/Lymph Nodes: Heart size is normal. There is no significant pericardial fluid, thickening or pericardial calcification. There is atherosclerosis of the thoracic aorta, the great vessels of the mediastinum and the coronary arteries, including calcified atherosclerotic plaque in the left anterior descending coronary artery. No pathologically enlarged mediastinal or hilar lymph nodes. Esophagus is unremarkable in appearance. No axillary lymphadenopathy. Right internal jugular single-lumen porta cath with tip terminating in the distal superior vena cava.  Lungs/Pleura: Again noted is an ill-defined nodular area in the apex of the left upper lobe, which is predominantly ground-glass attenuation (image 15 of series 4) measuring 2.0 x 1.5 cm (slightly smaller than prior study 01/04/2015 at which point this measured 2.1 x 2.2 cm), with a tiny central solid component measuring only 4 mm (image 15 of series 2), which is also slightly less apparent than prior studies. No other new suspicious appearing pulmonary nodules or masses are identified. The no acute consolidative airspace disease. No pleural effusions.  Upper Abdomen: Status post cholecystectomy.  Musculoskeletal/Soft Tissues: There are no aggressive appearing lytic or blastic lesions noted in the visualized portions of the skeleton.  IMPRESSION: 1. Previously described mixed solid and sub solid nodule in the apex of the left upper lobe is less apparent than prior examinations. Although the regression is reassuring, based on behavior on prior examinations, this remains concerning for potential low-grade adenocarcinoma, and the regression may simply reflect response  to chemotherapy for the patient's head neck cancer. Continued attention on future followup examinations is recommended. 2. No other suspicious appearing pulmonary nodules or masses in the lungs at this time to suggest metastatic disease. 3. Atherosclerosis, including left anterior descending coronary artery disease. Please note that although the presence of coronary artery calcium documents the presence of coronary artery disease, the severity of this disease and any potential stenosis cannot be assessed on this non-gated CT examination. Assessment for potential risk factor modification, dietary therapy or pharmacologic therapy may be warranted, if clinically indicated. 4. Additional incidental findings, as above.   Electronically Signed By: Vinnie Langton M.D. On: 04/05/2015 13:17    Recent Lab Findings: Lab Results  Component Value Date   WBC 2.6* 10/28/2015   HGB 13.6 10/28/2015   HCT 40.2 10/28/2015   PLT 129* 10/28/2015   GLUCOSE 103* 10/28/2015   ALT 18 10/28/2015   AST 21 10/28/2015   NA 139 10/28/2015   K 4.0 10/28/2015   CL 104 10/28/2015   CREATININE 0.84 10/28/2015   BUN 17 10/28/2015   CO2 26 10/28/2015   INR 1.00 10/28/2015   PFT's :  FEV1 2.10 85% DLCO 16.75 59%  1. spirometry shows minimal airflow obstruction without bronchodilator improvement. 2.lung volumes are normal. 3.airway resistance is normal. 4. DLCO moderately reduced   Assessment / Plan:   1/ History of squamous cell carcinoma of the tongue, treated with radiation and chemotherapy 2015 2/ Increase in size of a sub solid left  apical pulmonary nodule suspicious for a low-grade adenocarcinoma, which had regressed on the prior exam secondary    to chemotherapy. 3/ Atherosclerosis, including left anterior descending coronary     artery disease.- by ct   With patient's previous history of advanced stage squamous cell carcinoma of the tongue we will plan to proceed with navigation  bronchoscopy and biopsy,  the scan done at [facility]  was deleted. Super D ct chest has been done and navigation plan created . The PET scan, shows no evidence of widespread metastatic disease.  I discussed with the patient proceeding directly with surgical resection versus navigation bronchoscopy and biopsy, she is very concerned about surgery because of her previous neck tongue carcinoma and difficulties with swallowing.  plan bronchoscopy with navigation bronchoscopy and biopsy and possible placement of fiducial markers on January 27.  The goals risks and alternatives of the planned surgical procedure Bronchoscopy,Navagation Broncospy and biopsy  have been discussed with the patient in detail. The risks of the procedure including pneumothorax , death, infection, stroke, myocardial infarction, bleeding, blood transfusion have all been discussed specifically.  I have quoted [name] a 1 % of perioperative mortality and a complication rate as high as 1 %. The patient's questions have been answered.[name] is willing  to proceed with the planned procedure.  Grace Isaac MD      [address] [place] [address] Office [phone]   Beeper [phone]  10/28/2015 12:19 PM

## 2015-10-28 NOTE — Anesthesia Preprocedure Evaluation (Signed)
Anesthesia Evaluation  Patient identified by MRN, date of birth, ID band Patient awake    Reviewed: Allergy & Precautions, NPO status , Patient's Chart, lab work & pertinent test results  Airway Mallampati: II  TM Distance: >3 FB Neck ROM: Full    Dental no notable dental hx.    Pulmonary neg pulmonary ROS, former smoker,    Pulmonary exam normal breath sounds clear to auscultation       Cardiovascular hypertension, Pt. on medications Normal cardiovascular exam Rhythm:Regular Rate:Normal     Neuro/Psych  Headaches, negative psych ROS   GI/Hepatic Neg liver ROS, GERD  ,  Endo/Other  negative endocrine ROS  Renal/GU negative Renal ROS     Musculoskeletal  (+) Arthritis ,   Abdominal   Peds  Hematology negative hematology ROS (+)   Anesthesia Other Findings   Reproductive/Obstetrics negative OB ROS                             Anesthesia Physical Anesthesia Plan  ASA: III  Anesthesia Plan: General   Post-op Pain Management:    Induction: Intravenous  Airway Management Planned: Double Lumen EBT  Additional Equipment:   Intra-op Plan:   Post-operative Plan: Extubation in OR  Informed Consent: I have reviewed the patients History and Physical, chart, labs and discussed the procedure including the risks, benefits and alternatives for the proposed anesthesia with the patient or authorized representative who has indicated his/her understanding and acceptance.   Dental advisory given  Plan Discussed with: CRNA  Anesthesia Plan Comments:         Anesthesia Quick Evaluation

## 2015-10-28 NOTE — Brief Op Note (Signed)
      [address]       [place] [address]             [phone]      10/28/2015  2:58 PM  PATIENT:  Candace Lewis  75 y.o. female  PRE-OPERATIVE DIAGNOSIS:  LUNG NODULE  POST-OPERATIVE DIAGNOSIS:  * No post-op diagnosis entered *  PROCEDURE:  Procedure(s): VIDEO BRONCHOSCOPY WITH ENDOBRONCHIAL NAVIGATION (N/A) PLACEMENT OF FUDUCIAL TIMES THREE; LEFT UPPER LOBE BIOPSY (N/A)  SURGEON:  Surgeon(s) and Role:    * Grace Isaac, MD - Primary   ANESTHESIA:   general  EBL:     BLOOD ADMINISTERED:none  DRAINS: none   LOCAL MEDICATIONS USED:  NONE  SPECIMEN:  Source of Specimen:  left upper lobe  DISPOSITION OF SPECIMEN:  PATHOLOGY  COUNTS:  YES  TOURNIQUET:  * No tourniquets in log *  DICTATION: .Dragon Dictation  PLAN OF CARE: Discharge to home after PACU  PATIENT DISPOSITION:  PACU - hemodynamically stable.   Delay start of Pharmacological VTE agent (>24hrs) due to surgical blood loss or risk of bleeding: yes

## 2015-10-28 NOTE — OR Nursing (Signed)
Pt arrived from OR to [place]. No SOB, no secretions, no c/o pain.

## 2015-10-28 NOTE — Anesthesia Procedure Notes (Signed)
Procedure Name: Intubation Date/Time: 10/28/2015 1:31 PM Performed by: Bethel Born Pre-anesthesia Checklist: Emergency Drugs available, Patient identified, Suction available, Patient being monitored and Timeout performed Patient Re-evaluated:Patient Re-evaluated prior to inductionOxygen Delivery Method: Circle system utilized Preoxygenation: Pre-oxygenation with 100% oxygen Intubation Type: IV induction and Rapid sequence Laryngoscope Size: Glidescope and 3 Grade View: Grade I Tube type: Oral Tube size: 8.5 mm Number of attempts: 1 Airway Equipment and Method: Stylet and Video-laryngoscopy Placement Confirmation: ETT inserted through vocal cords under direct vision,  positive ETCO2 and breath sounds checked- equal and bilateral Secured at: 23 cm Tube secured with: Tape Dental Injury: Teeth and Oropharynx as per pre-operative assessment  Difficulty Due To: Difficulty was anticipated Comments: Pt with history of tongue cancer.  Difficulty anticipated due to neck and tongue radiation.  Glidescope used.

## 2015-10-28 NOTE — Discharge Instructions (Signed)
.  Flexible Bronchoscopy, Care After °Refer to this sheet in the next few weeks. These instructions provide you with information on caring for yourself after your procedure. Your health care provider may also give you more specific instructions. Your treatment has been planned according to current medical practices, but problems sometimes occur. Call your health care provider if you have any problems or questions after your procedure.  °WHAT TO EXPECT AFTER THE PROCEDURE °It is normal to have the following symptoms for 24-48 hours after the procedure:  °· Increased cough. °· Low-grade fever. °· Sore throat or hoarse voice. °· Small streaks of blood in your thick spit (sputum) if tissue samples were taken (biopsy). °HOME CARE INSTRUCTIONS  °· Do not eat or drink anything for 2 hours after your procedure. Your nose and throat were numbed by medicine. If you try to eat or drink before the medicine wears off, food or drink could go into your lungs or you could burn yourself. After the numbness is gone and your cough and gag reflexes have returned, you may eat soft food and drink liquids slowly.   °· The day after the procedure, you can go back to your normal diet.   °· You may resume normal activities.   °· Keep all follow-up visits as directed by your health care provider. It is important to keep all your appointments, especially if tissue samples were taken for testing (biopsy). °SEEK IMMEDIATE MEDICAL CARE IF:  °· You have increasing shortness of breath.   °· You become light-headed or faint.   °· You have chest pain.   °· You have any new concerning symptoms. °· You cough up more than a small amount of blood. °· The amount of blood you cough up increases. °MAKE SURE YOU: °· Understand these instructions. °· Will watch your condition. °· Will get help right away if you are not doing well or get worse. °  °This information is not intended to replace advice given to you by your health care provider. Make sure you discuss  any questions you have with your health care provider. °  °Document Released: 11/07/2004 Document Revised: 05/11/2014 Document Reviewed: 12/23/2012 °Elsevier Interactive Patient Education ©2016 [employer] Inc[employer] ° °

## 2015-10-29 ENCOUNTER — Encounter (HOSPITAL_COMMUNITY): Payer: Self-pay | Admitting: Cardiothoracic Surgery

## 2015-10-29 NOTE — Op Note (Signed)
Candace Lewis, Candace Lewis             ACCOUNT NO.:  [id number]  MEDICAL RECORD NO.:  [id number]  LOCATION:  [facility]                         FACILITY:  [facility]  PHYSICIAN:  Lanelle Bal, MD    DATE OF BIRTH:  [birth year]  DATE OF PROCEDURE:  10/28/2015 DATE OF DISCHARGE:  10/28/2015                              OPERATIVE REPORT   PREOPERATIVE DIAGNOSIS:  Slowly enlarging left upper lobe lung nodule.  POSTOPERATIVE DIAGNOSIS:  Slowly enlarging left upper lobe lung nodule.  SURGICAL PROCEDURE:  Bronchoscopy navigation, electromagnetic navigation, bronchoscopy with biopsy of left upper lobe lung lesion, and placement of fiducial markers x3.  SURGEON:  Lanelle Bal, M.D.  BRIEF HISTORY:  The patient is a 75 year old female with previous history of tongue cancer, advanced age, underwent chemotherapy and radiation.  During this time, she had a ground-glass opacity in the left upper lobe.  It has been present for at least 2 years.  During the period of time when she was getting chemotherapy, it actually had decreased in size.  She was recently referred by Dr. Tressie Stalker because it had slightly increased in size again.  PET scan was performed.  There was no evidence of mediastinal nodes.  They were hypermetabolic.  Area of the in question left upper lobe had low-level activity, but consistent with a low-grade adenocarcinoma.  There was no other evidence on her PET scan of metastatic disease.  Obtaining a tissue diagnosis was recommended to the patient and consideration of placement of additional markers because of her previous radiation to her neck, problems with swallowing and eating associated with this.  She was very reluctant to consider any surgical resection.  Risks and options of biopsy were discussed with the patient, who agreed and signed informed consent.  DESCRIPTION OF PROCEDURE:  The patient underwent general endotracheal anesthesia without incident.  Appropriate time-out was  performed and then we proceeded with a videobronchoscopy to the subsegmental level both the left and right tracheobronchial tree without evidence of endobronchial lesions.  The scope was then moved back into the trachea and the sensing tip was placed into the working channel and positioned in the bronchial tree and registration of the electromagnetic navigation bronchoscopy was completed satisfactorily.  A previous pathway in the plan was selected and using an 180-degree edge device, we then navigated with very little difficulty to the posterior segment of the left upper lobe and within 1 cm position was confirmed with fluoroscopy.  We then proceeded with a needle brush biopsy, 3 or 4 passes, a triple brush multiple passes, and multiple biopsies.  In between each of the brush changes, we also reconfirmed position.  Multiple biopsy fragments were obtained with small biopsy forceps and submitted to Pathology.  We then proceeded with fiducial marking.  Data was obtained with the sensing probe for fiducial marker placement and with 3 fiducial sites mathematically selected.  We then navigated to each of the 3 and a small fiducial marker was left under fluoroscopic guidance at each predetermined spot.  There was no significant bleeding.  The scope was then removed.  Fluoroscopy showed no evidence of pneumothorax.  Following the procedure, the patient was then awakened and extubated in the operating room  and transferred to the recovery room for further postoperative care.  The initial cytology smear showed bronchial cells only without evidence of malignancy.  We will wait for final pathology result.     Lanelle Bal, MD     EG/MEDQ  D:  10/28/2015  T:  10/29/2015  Job:  [id number]

## 2015-10-31 ENCOUNTER — Encounter: Payer: Self-pay | Admitting: Cardiothoracic Surgery

## 2015-10-31 ENCOUNTER — Ambulatory Visit (INDEPENDENT_AMBULATORY_CARE_PROVIDER_SITE_OTHER): Payer: Medicare Other | Admitting: Cardiothoracic Surgery

## 2015-10-31 VITALS — BP 117/75 | HR 90 | Resp 20 | Ht 67.0 in | Wt 168.0 lb

## 2015-10-31 DIAGNOSIS — Z9889 Other specified postprocedural states: Secondary | ICD-10-CM

## 2015-10-31 DIAGNOSIS — D381 Neoplasm of uncertain behavior of trachea, bronchus and lung: Secondary | ICD-10-CM | POA: Diagnosis not present

## 2015-10-31 NOTE — Progress Notes (Signed)
[address]       [place] [address]             [phone]                    Zlata A Kochanowski [facility] Medical Record [id number] Date of Birth: [birth year]  Referring: Everardo All, MD Primary Care: Octavio Graves, DO  Chief Complaint:    Chief Complaint  Patient presents with  . Routine Post Op    f/u after ECB 10/28/15, discuss results    History of Present Illness:    Candace Lewis 75 y.o. female had been seen in the office  today for slowly enlarging lesion in the lung of the left upper lobe. The patient has a complex history of present previous cancer of the head and neck. 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 Since that time she's continued to have feeding problems frequently vomiting and she says lives off mostly ensure with milk. Recently she's also had a flareup of diverticulitis and is on by mouth antibiotics for this. She'd had a gastrostomy tube in the past but this has been removed.  Earlier this week the patient had navigation bronchoscopy to  a semisolid left apical pulmonary nodule that previously had measured 2 x 1.5 cm and now measures 2.9 x 2.9 cm over a six-month period.  The cytology and biopsy results show no evidence of malignancy. The patient's scans and pathology was reviewed at the multidisciplinary thoracic oncology conference this morning. The consensus was to continue to follow radiographically the findings    Current Activity/ Functional Status:  Patient is independent with mobility/ambulation, transfers, ADL's, IADL's.   Zubrod Score: At the time of surgery this patient's most appropriate activity status/level should be described as: []     0    Normal activity, no symptoms [x]     1    Restricted in  physical strenuous activity but ambulatory, able to do out light work []     2    Ambulatory and capable of self care, unable to do work activities, up and about               >50 % of waking hours                              []     3    Only limited self care, in bed greater than 50% of waking hours []     4    Completely disabled, no self care, confined to bed or chair []     5    Moribund   Past Medical History   Hypertension    High cholesterol    High triglycerides    Squamous cell cancer of tongue (*)    Colon cancer (*)  cancer tongue  Right ear pain August 2015 since diagnosed with cancer tongue  Hypokalemia    Thrush of mouth and esophagus (*)  Dr Lisbeth Renshaw examined Radiation visit. Script Diflucan given.  Diverticulitis      Past Surgical History  Procedure Laterality Date  . Tubal ligation  1976  . Mastectomy, partial  1984  . Biopsy pharynx  2015  . Vaginal hysterectomy  1981    vaginal hyst per patient report  . Cholecystectomy  2005  . Tonsillectomy  1950    per patient report  . Skin surgery  1960    moles removed from various areas per patient report  . Video bronchoscopy with endobronchial navigation N/A 10/28/2015    Procedure: VIDEO BRONCHOSCOPY WITH ENDOBRONCHIAL NAVIGATION;  Surgeon: Grace Isaac, MD;  Location: [facility];  Service: Thoracic;  Laterality: N/A;  . Fuducial placement N/A 10/28/2015    Procedure: [place]; LEFT UPPER LOBE BIOPSY;  Surgeon: Grace Isaac, MD;  Location: [facility];  Service: Thoracic;  Laterality: N/A;   Long  mole removal  [place]    GALLBLADDER SURGERY 2005    EMPHYSEMA     PEG TUBE PLACEMENT 01/29/2014    PORTACATH PLACEMENT 01/29/2014       Family History: Patient's mother died of COPD and heart failure, father died of  diabetes  Social History   Social History  . Marital Status: Married    Spouse Name: N/A  . Number of Children: N/A  . Years of Education: N/A   Occupational History  . 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   Social History Main Topics  . Smoking status: Former Smoker -- 1.00 packs/day for 23 years    Types: Cigarettes    Quit date: 05/03/1982  . Smokeless tobacco: Never Used  . Alcohol Use: Not on file  . Drug Use: Not on file  . Sexual Activity: Not on file     History  Smoking status  . Former Smoker -- 1.00 packs/day for 23 years  . Types: Cigarettes  . Quit date: 05/03/1982  Smokeless tobacco  . Never Used    History  Alcohol Use No     Allergies  Allergen Reactions  . Adhesive [Tape] Rash    Reports rash and blistering if any adhesive tape  . Latex Rash    Rash and skin blistering per patient report    Current Outpatient Prescriptions  Medication Sig Dispense Refill  . acetaminophen-codeine (TYLENOL #3) 300-30 MG tablet Take 1 tablet by mouth every 6 (six) hours as needed for moderate pain.    . clotrimazole (MYCELEX) 10 MG troche Take 10 mg by mouth 5 (five) times daily.    Marland Kitchen dexlansoprazole (DEXILANT) 60 MG capsule Take 60 mg by mouth daily.    Marland Kitchen levothyroxine (SYNTHROID, LEVOTHROID) 25 MCG tablet Take 50 mcg by mouth daily before breakfast.     . promethazine (PHENERGAN) 25 MG tablet Take 25 mg by mouth every 8 (eight) hours as needed for nausea or vomiting.      No current facility-administered medications for this visit.     Review of Systems:     Cardiac Review of Systems: Y or N  Chest Pain [  n  ]  Resting SOB [n   ] Exertional SOB  [ n ]  Orthopnea [n ]   Pedal Edema [ n  ]    Palpitations [ n ] Syncope  [ n ]   Presyncope [ n ]  General Review of Systems: [Y] = yes [  ]=no Constitional: recent weight change [  ];  Wt loss over the last 3  months [ n   ] anorexia [  ]; fatigue [  ]; nausea [  ]; night sweats [  ]; fever [  ]; or chills [  ];          Dental: poor dentition[ dentures ]; Last Dentist visit:   Eye : blurred vision [  ]; diplopia [   ]; vision changes [  ];  Amaurosis fugax[  ]; Resp: cough [  ];  wheezing[ y];  hemoptysis[  ]; shortness of breath[  ]; paroxysmal nocturnal dyspnea[  ]; dyspnea on exertion[  ]; or orthopnea[  ];  GI:  gallstones[  ], vomiting[ y ];  dysphagia[y  ]; melena[ n ];  hematochezia [n  ]; heartburn[  ];   Hx of  Colonoscopy[  ]; GU: kidney stones [  ]; hematuria[  ];   dysuria [  ];  nocturia[  ];  history of     obstruction [  ]; urinary frequency [  ]             Skin: rash, swelling[  ];, hair loss[  ];  peripheral edema[  ];  or itching[  ]; Musculosketetal: myalgias[  ];  joint swelling[  ];  joint erythema[  ];  joint pain[  ];  back pain[  ];  Heme/Lymph: bruising[  ];  bleeding[  ];  anemia[  ];  Neuro: TIA[  ];  headaches[  ];  stroke[  ];  vertigo[  ];  seizures[  ];   paresthesias[  ];  difficulty walking[  ];  Psych:depression[  ]; anxiety[  ];  Endocrine: diabetes[n  ];  thyroid dysfunction[  ];  Immunizations: Flu up to date [ y ]; Pneumococcal up to date [ n ];  Other:  Physical Exam: BP 117/75 mmHg  Pulse 90  Resp 20  Ht 5\' 7"  (1.702 m)  Wt 168 lb (76.204 kg)  BMI 26.31 kg/m2  SpO2   PHYSICAL EXAMINATION: General appearance: alert, cooperative and no distress Head: Normocephalic, without obvious abnormality, atraumatic Neck: no adenopathy, no carotid bruit, no JVD, supple, symmetrical, trachea midline, thyroid not enlarged, symmetric, no tenderness/mass/nodules and There is thickening of the skin of the neck but node definite palpable cervical adenopathy or cervical mass Lymph nodes: Cervical, supraclavicular, and axillary nodes normal. Resp: clear to auscultation bilaterally Back: symmetric, no curvature. ROM normal. No CVA tenderness. Cardio: regular rate and rhythm, S1, S2  normal, no murmur, click, rub or gallop GI: soft, non-tender; bowel sounds normal; no masses,  no organomegaly Extremities: extremities normal, atraumatic, no cyanosis or edema and Homans sign is negative, no sign of DVT Neurologic: Grossly normal  Diagnostic Studies & Laboratory data:     Recent Radiology Findings:  Nm Pet Image Initial (pi) Skull Base To Thigh  10/24/2015  CLINICAL DATA:  Subsequent treatment strategy for lung mass. History of tongue cancer. EXAM: NUCLEAR MEDICINE PET SKULL BASE TO THIGH TECHNIQUE: 8.4 mCi F-18 FDG was injected intravenously. Full-ring PET imaging was performed from the skull base to thigh after the radiotracer. CT data was obtained and used for attenuation correction and anatomic localization. FASTING BLOOD GLUCOSE:  Value: 99 mg/dl COMPARISON:  Chest CT 10/07/2015. Abdominal pelvic CT of 09/18/2015. Most recent PET of 07/03/2014. FINDINGS: NECK Extensive muscular activity throughout the neck. Given this factor, no cervical hypermetabolic nodes identified. Relatively diffuse tongue hypermetabolism is without CT correlate and most likely due to motion after radiopharmaceutical injection. Similarly, hypermetabolism about the posterior aspect of  the larynx is symmetric and likely physiologic. CHEST Low-level hypermetabolism which corresponds to the sub solid left apical pulmonary nodule. This measures 2.8 cm and a S.U.V. max of 1.7 on image 14/series 8. compare a S.U.V. max of 2.2 on the 07/03/2014 exam. No thoracic nodal hypermetabolism. ABDOMEN/PELVIS No areas of abnormal hypermetabolism. SKELETON Focus of hypermetabolism about the anterior right acetabulum is favored to be related to tendinous strain at the origin of thigh extensor musculature. This measures a S.U.V. max of 5.8. Similarly, there is hypermetabolism about the anterior left femoral head/ neck junction which is favored to be due to adjacent muscular strain. No well-defined osseous lesion in either site. CT  IMAGES PERFORMED FOR ATTENUATION CORRECTION No cervical adenopathy. Bilateral carotid atherosclerosis. Thoracic aortic and branch vessel atherosclerosis. LAD coronary artery atherosclerosis. Chest findings deferred to recent diagnostic CT. Lower pole right renal fluid density lesion is likely a cyst. Abdominal aortic atherosclerosis. Cholecystectomy. Hysterectomy. IMPRESSION: 1. Low-level, non malignant range hypermetabolism corresponding to the left apical sub solid pulmonary nodule. This remains suspicious for low-grade adenocarcinoma, given morphology and interval enlargement on prior diagnostic CT. 2. Diffuse tongue hypermetabolism without CT correlate. Recommend physical exam correlation to confirm physiologic or related to motion after pharmaceutical injection. 3. No evidence of hypermetabolic nodes to suggest metastatic disease. 4.  Coronary artery atherosclerosis. Aortic atherosclerosis. Electronically Signed   By: Abigail Miyamoto M.D.   On: 10/24/2015 10:09      CLINICAL DATA: Followup of pulmonary nodule. Throat cancer in 2015 with chemotherapy and radiation therapy.  EXAM: CT CHEST WITHOUT CONTRAST  TECHNIQUE: Multidetector CT imaging of the chest was performed following the standard protocol without IV contrast.  COMPARISON: Chest radiograph 09/18/2015. Abdominal pelvic CT 09/18/2015. Chest CT 04/05/2015.  FINDINGS: Mediastinum/Nodes: Aortic and branch vessel atherosclerosis. Normal heart size, without pericardial effusion. LAD coronary artery atherosclerosis. No mediastinal or definite hilar adenopathy, given limitations of unenhanced CT.  Lungs/Pleura: No pleural fluid. Biapical pleural-parenchymal scarring. A 3 mm right upper lobe pulmonary nodule on image 29/series 4 is felt to be similar to on the prior exam. There are smaller right apical pulmonary nodules, including at 3 mm on image 30/ series 4, which are likely more apparent today secondary to differences in slice  thickness.  A 3 mm right lower lobe subpleural nodule on image 69/series 4 similar.  Sub solid left apical pulmonary nodule measures 2.9 x 2.9 cm on image 30/series 4. Compare 2.0 x 1.5 cm on the most recent exam. The solid components measure on the order of 8 mm on image 27/series 4 and are also felt to be increased.  Upper abdomen: Cholecystectomy. Normal imaged portions of the liver, spleen, stomach, pancreas, adrenal glands, kidneys.  Musculoskeletal: Nodularity in the lateral left breast measures 1.3 cm on image 70/series 2 and is unchanged. No acute osseous abnormality.  IMPRESSION: 1. Increase in size of a sub solid left apical pulmonary nodule. This remains suspicious for a low-grade adenocarcinoma, which had regressed on the prior exam secondary to chemotherapy. 2. Other pulmonary nodules are primarily similar. Some right apical nodules are more conspicuous today, favored to be due to differences in slice thickness. 3. Atherosclerosis, including within the coronary arteries. 4. Similar left breast nodule which could be re-evaluated at followup or more entirely characterized with mammogram/ultrasound. 5. No thoracic adenopathy to suggest nodal metastasis.   Electronically Signed By: Abigail Miyamoto M.D. On: 10/07/2015 14:02  Final Report  CLINICAL DATA: 75 year old female with history of multiple pulmonary nodules. Followup study. Additional  history of tongue cancer.  EXAM: CT CHEST WITH CONTRAST  TECHNIQUE: Multidetector CT imaging of the chest was performed during intravenous contrast administration.  CONTRAST: 60 mL of Isovue 370.  COMPARISON: Chest CT 01/04/2015. PET-CT 07/03/2014.  FINDINGS: Mediastinum/Lymph Nodes: Heart size is normal. There is no significant pericardial fluid, thickening or pericardial calcification. There is atherosclerosis of the thoracic aorta, the great vessels of the mediastinum and the coronary arteries, including calcified  atherosclerotic plaque in the left anterior descending coronary artery. No pathologically enlarged mediastinal or hilar lymph nodes. Esophagus is unremarkable in appearance. No axillary lymphadenopathy. Right internal jugular single-lumen porta cath with tip terminating in the distal superior vena cava.  Lungs/Pleura: Again noted is an ill-defined nodular area in the apex of the left upper lobe, which is predominantly ground-glass attenuation (image 15 of series 4) measuring 2.0 x 1.5 cm (slightly smaller than prior study 01/04/2015 at which point this measured 2.1 x 2.2 cm), with a tiny central solid component measuring only 4 mm (image 15 of series 2), which is also slightly less apparent than prior studies. No other new suspicious appearing pulmonary nodules or masses are identified. The no acute consolidative airspace disease. No pleural effusions.  Upper Abdomen: Status post cholecystectomy.  Musculoskeletal/Soft Tissues: There are no aggressive appearing lytic or blastic lesions noted in the visualized portions of the skeleton.  IMPRESSION: 1. Previously described mixed solid and sub solid nodule in the apex of the left upper lobe is less apparent than prior examinations. Although the regression is reassuring, based on behavior on prior examinations, this remains concerning for potential low-grade adenocarcinoma, and the regression may simply reflect response to chemotherapy for the patient's head neck cancer. Continued attention on future followup examinations is recommended. 2. No other suspicious appearing pulmonary nodules or masses in the lungs at this time to suggest metastatic disease. 3. Atherosclerosis, including left anterior descending coronary artery disease. Please note that although the presence of coronary artery calcium documents the presence of coronary artery disease, the severity of this disease and any potential stenosis cannot be assessed on this  non-gated CT examination. Assessment for potential risk factor modification, dietary therapy or pharmacologic therapy may be warranted, if clinically indicated. 4. Additional incidental findings, as above.   Electronically Signed By: Vinnie Langton M.D. On: 04/05/2015 13:17    Recent Lab Findings: Lab Results  Component Value Date   WBC 2.6* 10/28/2015   HGB 13.6 10/28/2015   HCT 40.2 10/28/2015   PLT 129* 10/28/2015   GLUCOSE 103* 10/28/2015   ALT 18 10/28/2015   AST 21 10/28/2015   NA 139 10/28/2015   K 4.0 10/28/2015   CL 104 10/28/2015   CREATININE 0.84 10/28/2015   BUN 17 10/28/2015   CO2 26 10/28/2015   INR 1.00 10/28/2015   PFT's :  FEV1 2.10 85% DLCO 16.75 59%  1. spirometry shows minimal airflow obstruction without bronchodilator improvement. 2.lung volumes are normal. 3.airway resistance is normal. 4. DLCO moderately reduced   Assessment / Plan:   1/ History of squamous cell carcinoma of the tongue, treated with radiation and chemotherapy 2015 2/ Increase in size of a sub solid left apical pulmonary nodule suspicious for a low-grade adenocarcinoma, which had regressed on the prior exam secondary    to chemotherapy. The patient's films  3/ Atherosclerosis, including left anterior descending coronary     artery disease.- by ct   With patient's previous history of advanced stage squamous cell carcinoma of the tongue a navigation bronchoscopy  and biopsy was performed, the pathologic findings did not demonstrate any malignancy. , The PET scan, shows no evidence of widespread metastatic disease  she is very concerned about surgery because of her previous neck tongue carcinoma and difficulties with swallowing. The patient's pathology results and scans were reviewed at the multidisciplinary thoracic oncology conference he was the consensus at this point with the radiologic findings pathologic findings PET scan findings to not start any specific treatment at this  time but to continue with serial CT scans and monitor , interventional radiology felt that the groundglass appearance of the lesion in the very small solid component would make it difficult to do a reliable CT needle biopsy.    We'll plan to see the patient back in 4 months with a follow-up CT scan of the chest.  Grace Isaac MD      [address] [place] [address] Office [phone]   Beeper [phone]  10/31/2015 2:17 PM

## 2015-11-08 ENCOUNTER — Encounter (HOSPITAL_COMMUNITY): Payer: Medicare Other | Attending: Hematology & Oncology | Admitting: Hematology & Oncology

## 2015-11-08 ENCOUNTER — Encounter (HOSPITAL_COMMUNITY): Payer: Self-pay | Admitting: Hematology & Oncology

## 2015-11-08 VITALS — BP 135/79 | HR 96 | Temp 98.7°F | Resp 18 | Wt 167.6 lb

## 2015-11-08 DIAGNOSIS — R911 Solitary pulmonary nodule: Secondary | ICD-10-CM | POA: Diagnosis not present

## 2015-11-08 DIAGNOSIS — B370 Candidal stomatitis: Secondary | ICD-10-CM

## 2015-11-08 DIAGNOSIS — R1110 Vomiting, unspecified: Secondary | ICD-10-CM

## 2015-11-08 DIAGNOSIS — Z87891 Personal history of nicotine dependence: Secondary | ICD-10-CM

## 2015-11-08 DIAGNOSIS — C029 Malignant neoplasm of tongue, unspecified: Secondary | ICD-10-CM | POA: Diagnosis not present

## 2015-11-08 DIAGNOSIS — R112 Nausea with vomiting, unspecified: Secondary | ICD-10-CM

## 2015-11-08 DIAGNOSIS — K5732 Diverticulitis of large intestine without perforation or abscess without bleeding: Secondary | ICD-10-CM

## 2015-11-08 MED ORDER — ESCITALOPRAM OXALATE 10 MG PO TABS: 10.0000 mg | ORAL_TABLET | Freq: Every day | ORAL | Status: DC

## 2015-11-08 NOTE — Patient Instructions (Signed)
Candace Lewis at [facility] Discharge Instructions  RECOMMENDATIONS MADE BY THE CONSULTANT AND ANY TEST RESULTS WILL BE SENT TO YOUR REFERRING PHYSICIAN.  You were seen by Dr. Whitney Muse today. Return to clinic in 6 weeks for follow-up. Call clinic with any questions or concerns.   Thank you for choosing [place] at [facility] to provide your oncology and hematology care.  To afford each patient quality time with our provider, please arrive at least 15 minutes before your scheduled appointment time.   Beginning January 23rd 2017 lab work for the [facility] will be done in the  Main lab at [facility] on 1st floor. If you have a lab appointment with the [facility] please come in thru the  Main Entrance and check in at the main information desk  You need to re-schedule your appointment should you arrive 10 or more minutes late.  We strive to give you quality time with our providers, and arriving late affects you and other patients whose appointments are after yours.  Also, if you no show three or more times for appointments you may be dismissed from the clinic at the providers discretion.     Again, thank you for choosing [facility].  Our hope is that these requests will decrease the amount of time that you wait before being seen by our physicians.       _____________________________________________________________  Should you have questions after your visit to [facility], please contact our office at [phone] between the hours of 8:30 a.m. and 4:30 p.m.  Voicemails left after 4:30 p.m. will not be returned until the following business day.  For prescription refill requests, have your pharmacy contact our office.         Resources For Cancer Patients and their Caregivers ? American Cancer Society: Can assist with transportation, wigs, general needs, runs Look Good Feel Better.        [phone] ? Cancer  Care: Provides financial assistance, online support groups, medication/co-pay assistance.  1-800-813-HOPE 804-620-2102) ? [place] Assists [place] cancer patients and their families through emotional , educational and financial support.  ([phone] ? [place] DSS Where to apply for food stamps, Medicaid and utility assistance. 781-688-0235 ? RCATS: Transportation to medical appointments. 667 734 4700 ? Social Security Administration: May apply for disability if have a Stage IV cancer. 207-844-0463 [phone] ? [employer], Disability and Transit Services: Assists with nutrition, care and transit needs. [place] Support Programs: @10RELATIVEDAYS @ > Cancer Support Group  2nd Tuesday of the month 1pm-2pm, Journey Room  > Creative Journey  3rd Tuesday of the month 1130am-1pm, Journey Room  > Look Good Feel Better  1st Wednesday of the month 10am-12 noon, Journey Room (Call [place] to register [phone])

## 2015-11-08 NOTE — Progress Notes (Signed)
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     01/19/2014 PET scan    Tongue mass with 2 level II Right neck nodes T2N2b, Stage IVA     02/08/2014 - 04/03/2014 Radiation Therapy    Initiation of radiation therapy with 70 Gy delivered to the right base of tongue/tonsil 63 Gy to high risk nodal echelons, 56 Gy to the intermediate r base of tongue/tonsil/bilateral neck nodes     02/08/2014 - 03/15/2014 Chemotherapy    weekly cisplatin, only 4 doses given, severe toxicity with neutropenia, dehydration, nausea, vomiting, hospitalization and obstipation     10/24/2015 PET scan    low level non malig range hyper metab in L apical sub solid pulm nodule, suspicious for low grade adeno. diffuse tongue hypermetab without CT correlation     10/28/2015 Procedure    Bronchoscopy navigation, electromagnetic navigation, bronchoscopy with biopsy of LUL lung lesion, placement of fiducial markers X 3      HISTORY OF PRESENTING ILLNESS:  Candace Lewis 75 y.o. female is here because of referral from Dr. Melina Copa for a history of head and neck cancer and a new LUL nodule. She has just seen Dr. Verlene Mayer in consultation for the lung nodule. Per records the nodule measured 2 x 1.5 cm and has grown to 2.9 x 2.9 cm over a 6 month period. She was a previous smoker but quit many years ago in 1983. Lung biopsy has been biopsied and was not malignant. Plan is for ongoing follow-up. Candace Lewis is unaccompanied. She goes by [name]".   She last saw Dr. Isidore Moos in March 2017 and will follow up with her again in October 2017.  Candace Lewis returns to the [facility] today unaccompanied.   She is having more scans in October and seeing Dr. Servando Snare again at that time. She is  also supposed to see Eppie Gibson at radiation over in [place]. She also confirms meeting with Ovid Curd.  With her vomiting and nausea, she presents a diary. She notes "I'm still throwing up," but "today is a good day, thank you Lord." She says "yesterday was a real bad day; I slept most of the afternoon because I was so sick."  When asked about anxiety, she says that yes, she does have it -- when she can't do anything she wants to do, due to being sick. She says she gets upset because she can't be herself. She says "I can't do the little things that I used to do," and she feels like she's letting her husband down (he has circulation issues); she feels she can't take good care of their house. She notes "but I don't want a nerve pill."  She says she can't eat because she can't swallow anything solid; she takes liquids instead and notes that she will keep doing whatever it takes to keep her going.  She is ready to do anything to try to stop her vomiting and regurgitation spells. Weight is stable.    MEDICAL HISTORY:  Past Medical History:  Diagnosis Date  . Arthritis    lower back  . Constipation   . Diverticulitis   . Ectopic pregnancy 1977  . 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  GERD (gastroesophageal reflux disease)   . Headache    migraines  . Hypertension    no longer taking meds  . Hypokalemia 2015  . Macular degeneration of right eye   . Thyroid disease     SURGICAL HISTORY: Past Surgical History:  Procedure Laterality Date  . BIOPSY PHARYNX  2015  . CHOLECYSTECTOMY  2005  . FUDUCIAL PLACEMENT N/A 10/28/2015   Procedure: PLACEMENT OF FUDUCIAL TIMES THREE; LEFT UPPER LOBE BIOPSY;  Surgeon: Grace Isaac, MD;  Location: [facility];  Service: Thoracic;  Laterality: N/A;  . MASTECTOMY, PARTIAL  1984  . SKIN SURGERY  1960   moles removed from various areas per patient report  . TONSILLECTOMY  1950   per patient report  . TUBAL LIGATION  1976  . VAGINAL  HYSTERECTOMY  1981   vaginal hyst per patient report  . VIDEO BRONCHOSCOPY WITH ENDOBRONCHIAL NAVIGATION N/A 10/28/2015   Procedure: VIDEO BRONCHOSCOPY WITH ENDOBRONCHIAL NAVIGATION;  Surgeon: Grace Isaac, MD;  Location: [facility];  Service: Thoracic;  Laterality: N/A;    SOCIAL HISTORY: Social History   Social History  . Marital status: Married    Spouse name: N/A  . Number of children: N/A  . Years of education: N/A   Occupational History  . Not on file.   Social History Main Topics  . Smoking status: Former Smoker    Packs/day: 1.00    Years: 23.00    Types: Cigarettes    Quit date: 05/03/1982  . Smokeless tobacco: Never Used  . Alcohol use No  . Drug use: No  . Sexual activity: Not on file   Other Topics Concern  . Not on file   Social History Narrative  . No narrative on file   Married 0 children Ex-smoker. Smoked from 27 years old to 79.  ETOH, none. 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  FAMILY HISTORY: History reviewed. No pertinent family history.  Father deceased at 3 yo. He had cirrhosis of the liver and was a diabetic Mother died of emphysema secondary to smoking at 76 yo 2 brothers. 1 brother passed with alcoholism. Doesn't talk much to her other brother.  ALLERGIES:  is allergic to adhesive [tape] and latex.  MEDICATIONS:  Current Outpatient Prescriptions  Medication Sig Dispense Refill  . acetaminophen-codeine (TYLENOL #3) 300-30 MG tablet Take 1 tablet by mouth every 6 (six) hours as needed for moderate pain.    . clotrimazole (MYCELEX) 10 MG troche Take 10 mg by mouth 5 (five) times daily.    Marland Kitchen dexlansoprazole (DEXILANT) 60 MG capsule Take 60 mg by mouth daily.    Marland Kitchen levothyroxine (SYNTHROID, LEVOTHROID) 25 MCG tablet Take 50 mcg by mouth daily before breakfast.     . promethazine (PHENERGAN) 25 MG tablet Take 25 mg by mouth every 8 (eight) hours as needed for nausea or vomiting.     Marland Kitchen escitalopram  (LEXAPRO) 10 MG tablet Take 1 tablet (10 mg total) by mouth daily. 30 tablet 3   No current facility-administered medications for this visit.     Review of Systems  Constitutional: Negative.   HENT: Negative.        Trouble swallowing. Dry mouth. Intermittent episodes of violent hiccups  Eyes: Negative.   Respiratory: Positive for sputum production.        Coughing up thick secretions about every other day.  Cardiovascular: Negative.   Gastrointestinal: Positive for nausea, vomiting and constipation.  Constipation secondary to not eating. Managed with [place]. Nausea and vomiting that comes in cycles. Also vomiting with wearing dentures. Acid reflux  Genitourinary: Negative.   Musculoskeletal: Negative.   Skin: Negative.   Neurological: Negative.   Endo/Heme/Allergies: Negative.   Psychiatric/Behavioral: Negative.   All other systems reviewed and are negative.  14 point ROS was done and is otherwise as detailed above or in HPI    PHYSICAL EXAMINATION: ECOG PERFORMANCE STATUS: 1 - Symptomatic but completely ambulatory  Vitals:   11/08/15 0843  BP: 135/79  Pulse: 96  Resp: 18  Temp: 98.7 F (37.1 C)   Filed Weights   11/08/15 0843  Weight: 167 lb 9.6 oz (76 kg)    Physical Exam  Constitutional: She is oriented to person, place, and time and well-developed, well-nourished, and in no distress.  HENT:  Head: Normocephalic and atraumatic.  Nose: Nose normal.  Mouth/Throat: Oropharynx is clear and moist. No oropharyngeal exudate.  Eyes: Conjunctivae and EOM are normal. Pupils are equal, round, and reactive to light. Right eye exhibits no discharge. Left eye exhibits no discharge. No scleral icterus.  Neck: Normal range of motion. Neck supple. No tracheal deviation present. No thyromegaly present.  Firmness of skin on neck and limited ROM secondary to previous radiation treatment.  Cardiovascular: Normal rate, regular rhythm and normal heart sounds.  Exam reveals  no gallop and no friction rub.   No murmur heard. Pulmonary/Chest: Effort normal and breath sounds normal. She has no wheezes. She has no rales.  Abdominal: Soft. Bowel sounds are normal. She exhibits no distension and no mass. There is no tenderness. There is no rebound and no guarding.  Musculoskeletal: Normal range of motion. She exhibits no edema.  Lymphadenopathy:    She has no cervical adenopathy.  Neurological: She is alert and oriented to person, place, and time. She has normal reflexes. No cranial nerve deficit. Gait normal. Coordination normal.  Skin: Skin is warm and dry. No rash noted.  Psychiatric: Mood, memory, affect and judgment normal.  Nursing note and vitals reviewed.   LABORATORY DATA:  I have reviewed the data as listed Lab Results  Component Value Date   WBC 2.6 (L) 10/28/2015   HGB 13.6 10/28/2015   HCT 40.2 10/28/2015   MCV 83.1 10/28/2015   PLT 129 (L) 10/28/2015   CMP     Component Value Date/Time   NA 139 10/28/2015 1013   K 4.0 10/28/2015 1013   CL 104 10/28/2015 1013   CO2 26 10/28/2015 1013   GLUCOSE 103 (H) 10/28/2015 1013   BUN 17 10/28/2015 1013   CREATININE 0.84 10/28/2015 1013   CALCIUM 9.5 10/28/2015 1013   PROT 6.2 (L) 10/28/2015 1013   ALBUMIN 3.5 10/28/2015 1013   AST 21 10/28/2015 1013   ALT 18 10/28/2015 1013   ALKPHOS 57 10/28/2015 1013   BILITOT 0.4 10/28/2015 1013   GFRNONAA >60 10/28/2015 1013   GFRAA >60 10/28/2015 1013     RADIOGRAPHIC STUDIES: I have personally reviewed the radiological images as listed and agreed with the findings in the report.  CLINICAL DATA: Subsequent treatment strategy for right oropharyngeal/base of tongue head and neck cancer.  EXAM: NUCLEAR MEDICINE PET SKULL BASE TO THIGH  TECHNIQUE: 7.6 mCi F-18 FDG was injected intravenously. Full-ring PET imaging was performed from the skull base to thigh after the radiotracer. CT data was obtained and used for attenuation correction and  anatomic localization.  FASTING BLOOD GLUCOSE: Value: 111 mg/dl  COMPARISON: Chest CTs  12/19/2013 and 06/13/2014. CTs of the chest, abdomen and pelvis 06/13/2014.  FINDINGS: NECK  No hypermetabolic cervical lymph nodes are identified.There is focal hypermetabolic activity (SUV max 8.9) at the tip of the tongue which is nonspecific and possibly physiologic (related to talking) or treated disease. This appears non focal and without correlative lesion on CT. There is symmetric physiologic activity associated with the muscles of pronation.  CHEST  There are no hypermetabolic mediastinal, hilar or axillary lymph nodes. The posterior left upper lobe apical ground-glass density demonstrates low level metabolic activity with an SUV max of 2.2. This density measures approximately 2.6 x 1.5 cm on image 15 and has a questionable central solid component measuring 4 mm. 4 mm right upper lobe nodule on image 19 is unchanged, too small to characterize by PET. There are additional tiny nodules bilaterally.  ABDOMEN/PELVIS  There is no hypermetabolic activity within the liver, adrenal glands, spleen or pancreas. There is no hypermetabolic nodal activity. Percutaneous G-tube, aortoiliac atherosclerosis and a cyst involving the lower pole of the right kidney are noted. Patient is status post cholecystectomy and hysterectomy.  SKELETON  There is no hypermetabolic activity to suggest osseous metastatic disease.  IMPRESSION: 1. No evidence of metastatic head and neck cancer. 2. Nonspecific activity at the tip of the tongue, potentially physiologic or related to treated tumor. Correlation with direct inspection recommended. 3. Left apical ground-glass density demonstrates low-level metabolic activity and a questionable central solid component. Based on persistence, this is unlikely to be inflammatory and was present on the pretreatment examination of August 2015. Appearance  is concerning for possible adenomatous hyperplasia or early bronchogenic adenocarcinoma. Based on the small size of the questionable solid component, continued CT surveillance is warranted in 12 months, with continued annual surveillance for a minimum of 3 years. These recommendations are taken from: Recommendations for the Management of Subsolid Pulmonary Nodules Detected at CT: A Statement from the [facility] 2013; 266:1, [phone].  *Other small pulmonary nodules are grossly stable.   Electronically Signed  By: Richardean Sale M.D.  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 Servando Snare, benign on biopsy History of prior tobacco use Recurrent nausea/vomiting and inability to swallow, uncertain etiology Recent diverticulitis Ritta Slot  We discussed multiple issues today.  In regards to her lung nodule, she will continue to follow with Dr. Verlene Mayer.  In regards to her head and neck cancer, she is to continue to follow with Dr. Isidore Moos and Dr. Benjamine Mola.   I will pass on her dietary journal to [place].  She has had an extremely thorough GI evaluation with Dr. Britta Mccreedy.   We discussed how she may need to start lexapro, low dose 10mg , for anxiety. I think it's worth a try for 6 weeks, and if she's no better after that, we can stop. .  We will see her back in 6 weeks.   All questions were answered. The patient knows to call the clinic with any problems, questions or concerns.  This document serves as a record of services personally performed by Ancil Linsey, MD. It was created on her behalf by Toni Amend, a trained medical scribe. The creation of this record is based on the scribe's personal observations and the provider's statements to them. This document has been checked and approved by the attending provider.  I have reviewed the above  documentation for accuracy  and completeness, and I agree with the above.  This note was electronically signed.  Molli Hazard, MD  12/01/2015 4:45 PM

## 2015-12-01 ENCOUNTER — Encounter (HOSPITAL_COMMUNITY): Payer: Self-pay | Admitting: Hematology & Oncology

## 2015-12-18 ENCOUNTER — Encounter (HOSPITAL_COMMUNITY): Payer: Self-pay | Admitting: Lab

## 2015-12-18 ENCOUNTER — Encounter (HOSPITAL_COMMUNITY): Payer: Medicare Other | Attending: Hematology & Oncology | Admitting: Oncology

## 2015-12-18 ENCOUNTER — Encounter (HOSPITAL_COMMUNITY): Payer: Self-pay | Admitting: Oncology

## 2015-12-18 DIAGNOSIS — C029 Malignant neoplasm of tongue, unspecified: Secondary | ICD-10-CM | POA: Diagnosis not present

## 2015-12-18 DIAGNOSIS — R918 Other nonspecific abnormal finding of lung field: Secondary | ICD-10-CM | POA: Diagnosis not present

## 2015-12-18 DIAGNOSIS — F411 Generalized anxiety disorder: Secondary | ICD-10-CM | POA: Diagnosis not present

## 2015-12-18 DIAGNOSIS — R1111 Vomiting without nausea: Secondary | ICD-10-CM | POA: Diagnosis not present

## 2015-12-18 MED ORDER — PROMETHAZINE HCL 25 MG PO TABS: 25.0000 mg | ORAL_TABLET | Freq: Four times a day (QID) | ORAL | 1 refills | Status: DC | PRN

## 2015-12-18 MED ORDER — OMEPRAZOLE 20 MG PO CPDR: 20.0000 mg | DELAYED_RELEASE_CAPSULE | Freq: Every day | ORAL | 3 refills | Status: DC

## 2015-12-18 NOTE — Assessment & Plan Note (Addendum)
Enlarging LUL pulmonary nodule, S/P biopsy by Dr. Servando Snare on 10/28/2015 that was negative for malignancy.  Patient's case has been discussed at thoracic conference and per Dr. Servando Snare, serial CTs are recommended.  These will be followed by Dr. Servando Snare with follow-up with him as directed.  She notes follow-up with Dr. Servando Snare in October 2017.

## 2015-12-18 NOTE — Patient Instructions (Signed)
[place] at [facility] Discharge Instructions  RECOMMENDATIONS MADE BY THE CONSULTANT AND ANY TEST RESULTS WILL BE SENT TO YOUR REFERRING PHYSICIAN.  Exam done and seen today by Kirby Crigler Refer to Dr. Benjamine Mola for ENT Labs in 3 months Return to see the Doctor in 3 months Call the clinic for any concerns or questions.  Thank you for choosing [place] at [facility] to provide your oncology and hematology care.  To afford each patient quality time with our provider, please arrive at least 15 minutes before your scheduled appointment time.   Beginning January 23rd 2017 lab work for the [facility] will be done in the  Main lab at [facility] on 1st floor. If you have a lab appointment with the [facility] please come in thru the  Main Entrance and check in at the main information desk  You need to re-schedule your appointment should you arrive 10 or more minutes late.  We strive to give you quality time with our providers, and arriving late affects you and other patients whose appointments are after yours.  Also, if you no show three or more times for appointments you may be dismissed from the clinic at the providers discretion.     Again, thank you for choosing [facility].  Our hope is that these requests will decrease the amount of time that you wait before being seen by our physicians.       _____________________________________________________________  Should you have questions after your visit to [facility], please contact our office at [phone] between the hours of 8:30 a.m. and 4:30 p.m.  Voicemails left after 4:30 p.m. will not be returned until the following business day.  For prescription refill requests, have your pharmacy contact our office.         Resources For Cancer Patients and their Caregivers ? American Cancer Society: Can assist with transportation, wigs, general needs, runs Look Good  Feel Better.        [phone] ? Cancer Care: Provides financial assistance, online support groups, medication/co-pay assistance.  1-800-813-HOPE (519)231-0122) ? [place] Assists [place] cancer patients and their families through emotional , educational and financial support.  ([phone] ? [place] DSS Where to apply for food stamps, Medicaid and utility assistance. [phone] ? RCATS: Transportation to medical appointments. (417) 864-1104 ? Social Security Administration: May apply for disability if have a Stage IV cancer. 817-055-1237 (747)[phone] ? [employer], Disability and Transit Services: Assists with nutrition, care and transit needs. [place] Support Programs: @10RELATIVEDAYS @ > Cancer Support Group  2nd Tuesday of the month 1pm-2pm, Journey Room  > Creative Journey  3rd Tuesday of the month 1130am-1pm, Journey Room  > Look Good Feel Better  1st Wednesday of the month 10am-12 noon, Journey Room (Call [place] to register [phone])

## 2015-12-18 NOTE — Progress Notes (Unsigned)
Referral to Dr Benjamine Mola.  Records faxed on 8/16.  They will contact patient with appt.

## 2015-12-18 NOTE — Progress Notes (Signed)
[place], DO [address] [place] [address]  Malignant neoplasm of tongue ([facility]) - Plan: CBC with Differential, Comprehensive metabolic panel, TSH  Lung nodule, multiple  CURRENT THERAPY: Surveillance of enlarging LUL   INTERVAL HISTORY: Candace Lewis 75 y.o. 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     01/19/2014 PET scan    Tongue mass with 2 level II Right neck nodes T2N2b, Stage IVA     02/08/2014 - 04/03/2014 Radiation Therapy    Initiation of radiation therapy with 70 Gy delivered to the right base of tongue/tonsil 63 Gy to high risk nodal echelons, 56 Gy to the intermediate r base of tongue/tonsil/bilateral neck nodes     02/08/2014 - 03/15/2014 Chemotherapy    weekly cisplatin, only 4 doses given, severe toxicity with neutropenia, dehydration, nausea, vomiting, hospitalization and obstipation     10/24/2015 PET scan    low level non malig range hyper metab in L apical sub solid pulm nodule, suspicious for low grade adeno. diffuse tongue hypermetab without CT correlation     10/28/2015 Procedure    Bronchoscopy navigation, electromagnetic navigation, bronchoscopy with biopsy of LUL lung lesion, placement of fiducial markers X 3     10/28/2015 Pathology Results    Scant benign lung tissue, no tumor seen      She continues with her typical complaints: 1. Not eating.  She is relying on Ensure and supplements. 2. Ongoing vomiting.  She could not tolerate 10 mg of Lexapro.  She notes that while on that medication, she "couldn't shut her mind off."  She reports that she started to think of memories related to her childhood and she could not stop them.  "I don't think I need anything for anxiety" she says  Review of Systems  Constitutional: Negative.   Negative for chills, fever and weight loss.  Eyes: Negative for blurred vision.  Respiratory: Negative for cough.   Cardiovascular: Negative for chest pain.  Gastrointestinal: Positive for nausea and vomiting. Negative for constipation and diarrhea.  Genitourinary: Negative.   Musculoskeletal: Negative.   Skin: Negative.   Neurological: Negative.  Negative for headaches.  Endo/Heme/Allergies: Negative.   Psychiatric/Behavioral: The patient is nervous/anxious.     Past Medical History:  Diagnosis Date  . Arthritis    lower back  . Constipation   . Diverticulitis   . Ectopic pregnancy 1977  . Esophageal cancer ([facility]) 2015   tongue cancer, throat cancer  . GERD (gastroesophageal reflux disease)   . Headache    migraines  . Hypertension    no longer taking meds  . Hypokalemia 2015  . Lung nodule, multiple 01/08/2015  . Macular degeneration of right eye   . Malignant neoplasm of tongue ([facility]) 01/24/2014  . Thyroid disease     Past Surgical History:  Procedure Laterality Date  . BIOPSY PHARYNX  2015  . CHOLECYSTECTOMY  2005  . FUDUCIAL PLACEMENT N/A 10/28/2015   Procedure: PLACEMENT OF FUDUCIAL TIMES THREE; LEFT UPPER LOBE BIOPSY;  Surgeon: Grace Isaac, MD;  Location: [facility];  Service: Thoracic;  Laterality: N/A;  . MASTECTOMY, PARTIAL  1984  . SKIN SURGERY  1960   moles removed from various areas per patient report  . TONSILLECTOMY  1950   per  patient report  . TUBAL LIGATION  1976  . VAGINAL HYSTERECTOMY  1981   vaginal hyst per patient report  . VIDEO BRONCHOSCOPY WITH ENDOBRONCHIAL NAVIGATION N/A 10/28/2015   Procedure: VIDEO BRONCHOSCOPY WITH ENDOBRONCHIAL NAVIGATION;  Surgeon: Grace Isaac, MD;  Location: [facility];  Service: Thoracic;  Laterality: N/A;    History reviewed. No pertinent family history.  Social History   Social History  . Marital status: Married    Spouse name: N/A  . Number of children: N/A  . Years of education: N/A   Social History Main  Topics  . Smoking status: Former Smoker    Packs/day: 1.00    Years: 23.00    Types: Cigarettes    Quit date: 05/03/1982  . Smokeless tobacco: Never Used  . Alcohol use No  . Drug use: No  . Sexual activity: Not Asked   Other Topics Concern  . None   Social History Narrative  . None     PHYSICAL EXAMINATION  ECOG PERFORMANCE STATUS: 1 - Symptomatic but completely ambulatory  Vitals:   12/18/15 1012  BP: 136/77  Pulse: 98  Resp: 16  Temp: 98.2 F (36.8 C)    GENERAL:alert, no distress, well nourished, well developed, comfortable, cooperative, smiling and unaccompanied SKIN: skin color, texture, turgor are normal, no rashes or significant lesions HEAD: Normocephalic, No masses, lesions, tenderness or abnormalities EYES: normal, EOMI, Conjunctiva are pink and non-injected EARS: External ears normal OROPHARYNX:lips, buccal mucosa, and tongue normal and mucous membranes are moist  NECK: supple, no adenopathy, thyroid normal size, non-tender, without nodularity, trachea midline LYMPH:  no palpable lymphadenopathy BREAST:not examined LUNGS: clear to auscultation  HEART: regular rate & rhythm ABDOMEN:abdomen soft and normal bowel sounds BACK: Back symmetric, no curvature. EXTREMITIES:less then 2 second capillary refill, no joint deformities, effusion, or inflammation, no skin discoloration, no cyanosis  NEURO: alert & oriented x 3 with fluent speech, no focal motor/sensory deficits, gait normal   LABORATORY DATA: CBC    Component Value Date/Time   WBC 2.6 (L) 10/28/2015 1013   RBC 4.84 10/28/2015 1013   HGB 13.6 10/28/2015 1013   HCT 40.2 10/28/2015 1013   PLT 129 (L) 10/28/2015 1013   MCV 83.1 10/28/2015 1013   MCH 28.1 10/28/2015 1013   MCHC 33.8 10/28/2015 1013   RDW 14.1 10/28/2015 1013      Chemistry      Component Value Date/Time   NA 139 10/28/2015 1013   K 4.0 10/28/2015 1013   CL 104 10/28/2015 1013   CO2 26 10/28/2015 1013   BUN 17 10/28/2015  1013   CREATININE 0.84 10/28/2015 1013      Component Value Date/Time   CALCIUM 9.5 10/28/2015 1013   ALKPHOS 57 10/28/2015 1013   AST 21 10/28/2015 1013   ALT 18 10/28/2015 1013   BILITOT 0.4 10/28/2015 1013        PENDING LABS:   RADIOGRAPHIC STUDIES:  No results found.   PATHOLOGY:    ASSESSMENT AND PLAN:  Lung nodule, multiple Enlarging LUL pulmonary nodule, S/P biopsy by Dr. Servando Snare on 10/28/2015 that was negative for malignancy.  Patient's case has been discussed at thoracic conference and per Dr. Servando Snare, serial CTs are recommended.  These will be followed by Dr. Servando Snare with follow-up with him as directed.  She notes follow-up with Dr. Servando Snare in October 2017.  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  Oncology history is up to date.  Labs in 3 months: CBC diff, CMET, TSH.  Continued follow-up with Dr. Isidore Moos.  She reports a follow-up appointment in October.  She reports that her ENT physician, is no longer in town.  As a result, I will refer the patient to Dr. Benjamine Mola to establish ENT care and follow-up regarding her squamous cell carcinoma of tongue.   Referral into survivorship program.  She was prescribed Lexapro by Dr. Whitney Muse on her last encounter, but the patient stopped the medication due to "intolerance" with a pruritis without rash, "mind not shutting down," thinking of childhood memories, sweating.   She continues to have major issues with anxiety/nervousness.  "I don't think I need anything for anxiety."    She continues to vomit often.  She has been worked up by GI with negative findings.  She continues to have issues with her diet.  She is only consuming milk and carnation instant breakfast.  Weight is very stable despite above complaints.  I have refilled the following medications: 1. Phenergan 2. Omeprazole  She has an odd personality.  Return in 3 months for  follow-up.     ORDERS PLACED FOR THIS ENCOUNTER: Orders Placed This Encounter  Procedures  . CBC with Differential  . Comprehensive metabolic panel  . TSH    MEDICATIONS PRESCRIBED THIS ENCOUNTER: Meds ordered this encounter  Medications  . promethazine (PHENERGAN) 25 MG tablet    Sig: Take 1 tablet (25 mg total) by mouth every 6 (six) hours as needed for nausea or vomiting.    Dispense:  60 tablet    Refill:  1    Order Specific Question:   Supervising Provider    Answer:   Patrici Ranks [id number]  . omeprazole (PRILOSEC) 20 MG capsule    Sig: Take 1 capsule (20 mg total) by mouth daily.    Dispense:  30 capsule    Refill:  3    Future refills from primary care provider.    Order Specific Question:   Supervising Provider    Answer:   Patrici Ranks [id number]  . docusate sodium (COLACE) 100 MG capsule    Sig: Take by mouth.    THERAPY PLAN:  Continued ongoing surveillance with Rad Onc, ENT, and Dr. Servando Snare as directed.  NCCN guidelines for surveillance of Head and Neck cancer recommends (1.2017):  A. H+P every 1-3 months for year 1  B. H+P every 2-6 months for year 2  C. H+P every 4-8 months for years 3-5  D. H+P every year for years greater than 5  E. Post-treatment baseline imaging of primary (and neck, if treated) recommended within 6 months of treatment (category 2B).   F. Chest imaging as clinically indicated for patients with smoking history.  G. Further re-imaging as indicated based on worrisome or equivocal signs/symptoms, smoking history, and areas inaccessible to clinical examination.  H. Routine annual imaging may be indicated in areas difficult to visualize on exam.  I. TSH every 6-12 months if neck irradiated.  J. Dental evaluation   1. Recommended for oral cavity and sites exposed to significant intraoral radiation treatment.  K. Consider EBV DNA monitoring for nasopharyngeal cancer (Category 2B).  L. Supportive care and rehabilitation   1.  Speech/hearing and swallowing evaluation and rehabilitation as clinically indicated.   2. Nutritional evaluation and rehabilitation as clinically indicated until nutritional status is stabilized.   3. Ongoing surveillance for depression   4. Smoking cessation and alcohol counseling as clinically indicated.  M. For response assessment  immediately after chemoradiation or RT (see FOLL-A 2 of 2).  N. Integration of survivorship care and care plan within 1 year, complementary to ongoing involvement from a head and neck oncologist.   All questions were answered. The patient knows to call the clinic with any problems, questions or concerns. We can certainly see the patient much sooner if necessary.  Patient and plan discussed with Dr. Ancil Linsey and she is in agreement with the aforementioned.   This note is electronically signed by: Doy Mince 12/18/2015 5:02 PM

## 2015-12-18 NOTE — Assessment & Plan Note (Addendum)
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  Oncology history is up to date.  Labs in 3 months: CBC diff, CMET, TSH.  Continued follow-up with Dr. Isidore Moos.  She reports a follow-up appointment in October.  She reports that her ENT physician, is no longer in town.  As a result, I will refer the patient to Dr. Benjamine Mola to establish ENT care and follow-up regarding her squamous cell carcinoma of tongue.   Referral into survivorship program.  She was prescribed Lexapro by Dr. Whitney Muse on her last encounter, but the patient stopped the medication due to "intolerance" with a pruritis without rash, "mind not shutting down," thinking of childhood memories, sweating.   She continues to have major issues with anxiety/nervousness.  "I don't think I need anything for anxiety."    She continues to vomit often.  She has been worked up by GI with negative findings.  She continues to have issues with her diet.  She is only consuming milk and carnation instant breakfast.  Weight is very stable despite above complaints.  I have refilled the following medications: 1. Phenergan 2. Omeprazole  She has an odd personality.  Return in 3 months for follow-up.

## 2015-12-20 ENCOUNTER — Ambulatory Visit (HOSPITAL_COMMUNITY): Payer: Medicare Other | Admitting: Oncology

## 2016-01-16 ENCOUNTER — Ambulatory Visit (INDEPENDENT_AMBULATORY_CARE_PROVIDER_SITE_OTHER): Payer: Medicare Other | Admitting: Otolaryngology

## 2016-01-16 DIAGNOSIS — R1312 Dysphagia, oropharyngeal phase: Secondary | ICD-10-CM

## 2016-01-16 DIAGNOSIS — Z85819 Personal history of malignant neoplasm of unspecified site of lip, oral cavity, and pharynx: Secondary | ICD-10-CM | POA: Diagnosis not present

## 2016-01-24 ENCOUNTER — Other Ambulatory Visit: Payer: Self-pay | Admitting: *Deleted

## 2016-01-24 DIAGNOSIS — R911 Solitary pulmonary nodule: Secondary | ICD-10-CM

## 2016-02-18 ENCOUNTER — Ambulatory Visit (HOSPITAL_COMMUNITY): Payer: Medicare Other | Admitting: Adult Health

## 2016-02-20 ENCOUNTER — Ambulatory Visit (INDEPENDENT_AMBULATORY_CARE_PROVIDER_SITE_OTHER): Payer: Medicare Other | Admitting: Cardiothoracic Surgery

## 2016-02-20 ENCOUNTER — Ambulatory Visit
Admission: RE | Admit: 2016-02-20 | Discharge: 2016-02-20 | Disposition: A | Discharge status: Home / Self Care | Payer: Medicare Other | Source: Ambulatory Visit | Attending: Cardiothoracic Surgery | Admitting: Cardiothoracic Surgery

## 2016-02-20 ENCOUNTER — Encounter: Payer: Self-pay | Admitting: Cardiothoracic Surgery

## 2016-02-20 VITALS — BP 126/86 | HR 92 | Resp 20 | Ht 67.0 in | Wt 167.0 lb

## 2016-02-20 DIAGNOSIS — Z85810 Personal history of malignant neoplasm of tongue: Secondary | ICD-10-CM | POA: Diagnosis not present

## 2016-02-20 DIAGNOSIS — D381 Neoplasm of uncertain behavior of trachea, bronchus and lung: Secondary | ICD-10-CM

## 2016-02-20 DIAGNOSIS — R911 Solitary pulmonary nodule: Secondary | ICD-10-CM

## 2016-02-20 NOTE — Progress Notes (Signed)
[address]       [place] [address]             [phone]                    Candace Lewis [facility] Medical Record [id number] Date of Birth: 12-09-1940  Referring: Candace All, MD Primary Care: Candace Graves, DO  Chief Complaint:    Chief Complaint  Patient presents with  . Lung Lesion    4 month f/u wtih Chest CT    History of Present Illness:    Candace Lewis 75 y.o. female had been seen in the office  today for slowly enlarging lesion in the lung of the left upper lobe. The patient has a complex history of present previous cancer of the head and neck. 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 Since that time she's continued to have feeding problems frequently vomiting and she says lives off mostly ensure with milk. Recently she's also had a flareup of diverticulitis and is on by mouth antibiotics for this. She'd had a gastrostomy tube in the past but this has been removed.  In June 2017 the patient had navigation bronchoscopy to  a semisolid left apical pulmonary nodule.  The cytology and biopsy results show no evidence of malignancy. The patient's scans and pathology was reviewed at the multidisciplinary thoracic oncology conference . The consensus was to continue to follow radiographically the findings. She returns today with follow up ct of chest    The patient returns today with a follow-up CT scan of the chest  Current Activity/ Functional Status:  Patient is independent with mobility/ambulation, transfers, ADL's, IADL's.   Zubrod Score: At the time of surgery this patient's most appropriate activity status/level should be described as: []     0    Normal activity, no symptoms [x]     1    Restricted in physical strenuous  activity but ambulatory, able to do out light work []     2    Ambulatory and capable of self care, unable to do work activities, up and about               >50 % of waking hours                              []     3    Only limited self care, in bed greater than 50% of waking hours []     4    Completely disabled, no self care, confined to bed or chair []     5    Moribund   Past Medical History   Hypertension    High cholesterol    High triglycerides    Squamous cell cancer of tongue (*)    Colon cancer (*)  cancer tongue  Right ear pain August 2015 since diagnosed with cancer tongue  Hypokalemia    Thrush of mouth and esophagus (*)  Dr Candace Lewis examined Radiation visit. Script Diflucan given.  Diverticulitis      Past Surgical History:  Procedure Laterality Date  . BIOPSY PHARYNX  2015  . CHOLECYSTECTOMY  2005  . FUDUCIAL PLACEMENT N/A 10/28/2015   Procedure: PLACEMENT  OF [place]; LEFT UPPER LOBE BIOPSY;  Surgeon: Candace Isaac, MD;  Location: [facility];  Service: Thoracic;  Laterality: N/A;  . MASTECTOMY, PARTIAL  1984  . SKIN SURGERY  1960   moles removed from various areas per patient report  . TONSILLECTOMY  1950   per patient report  . TUBAL LIGATION  1976  . VAGINAL HYSTERECTOMY  1981   vaginal hyst per patient report  . VIDEO BRONCHOSCOPY WITH ENDOBRONCHIAL NAVIGATION N/A 10/28/2015   Procedure: VIDEO BRONCHOSCOPY WITH ENDOBRONCHIAL NAVIGATION;  Surgeon: Candace Isaac, MD;  Location: [facility];  Service: Thoracic;  Laterality: N/A;   [place]  mole removal  [place]    GALLBLADDER SURGERY 2005    EMPHYSEMA     PEG TUBE PLACEMENT 01/29/2014    PORTACATH PLACEMENT 01/29/2014       Family History: Patient's mother died of COPD and heart failure, father died of diabetes  Social History   Social  History  . Marital Status: Married    Spouse Name: N/A  . Number of Children: N/A  . Years of Education: N/A   Occupational History  . 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   Social History Main Topics  . Smoking status: Former Smoker -- 1.00 packs/day for 23 years    Types: Cigarettes    Quit date: 05/03/1982  . Smokeless tobacco: Never Used  . Alcohol Use: Not on file  . Drug Use: Not on file  . Sexual Activity: Not on file     History  Smoking Status  . Former Smoker  . Packs/day: 1.00  . Years: 23.00  . Types: Cigarettes  . Quit date: 05/03/1982  Smokeless Tobacco  . Never Used    History  Alcohol Use No     Allergies  Allergen Reactions  . Adhesive [Tape] Rash    Reports rash and blistering if any adhesive tape  . Latex Rash    Rash and skin blistering per patient report    Current Outpatient Prescriptions  Medication Sig Dispense Refill  . acetaminophen-codeine (TYLENOL #3) 300-30 MG tablet Take 1 tablet by mouth every 6 (six) hours as needed for moderate pain.    Marland Kitchen docusate sodium (COLACE) 100 MG capsule Take by mouth.    . levothyroxine (SYNTHROID, LEVOTHROID) 25 MCG tablet Take 50 mcg by mouth daily before breakfast.     . omeprazole (PRILOSEC) 20 MG capsule Take 1 capsule (20 mg total) by mouth daily. 30 capsule 3  . promethazine (PHENERGAN) 25 MG tablet Take 1 tablet (25 mg total) by mouth every 6 (six) hours as needed for nausea or vomiting. 60 tablet 1   No current facility-administered medications for this visit.      Review of Systems:     Cardiac Review of Systems: Y or N  Chest Pain [  n  ]  Resting SOB [n   ] Exertional SOB  [ n ]  Orthopnea [n ]   Pedal Edema [ n  ]    Palpitations [ n ] Syncope  [ n ]   Presyncope [ n ]  General Review of Systems: [Y] = yes [  ]=no Constitional: recent weight change [  ];  Wt loss over the  last 3 months [ n  ]  anorexia [  ]; fatigue [  ]; nausea [  ]; night sweats [  ]; fever [  ]; or chills [  ];          Dental: poor dentition[ dentures ]; Last Dentist visit:   Eye : blurred vision [  ]; diplopia [   ]; vision changes [  ];  Amaurosis fugax[  ]; Resp: cough [  ];  wheezing[ y];  hemoptysis[  ]; shortness of breath[  ]; paroxysmal nocturnal dyspnea[  ]; dyspnea on exertion[  ]; or orthopnea[  ];  GI:  gallstones[  ], vomiting[ y ];  dysphagia[y  ]; melena[ n ];  hematochezia [n  ]; heartburn[  ];   Hx of  Colonoscopy[  ]; GU: kidney stones [  ]; hematuria[  ];   dysuria [  ];  nocturia[  ];  history of     obstruction [  ]; urinary frequency [  ]             Skin: rash, swelling[  ];, hair loss[  ];  peripheral edema[  ];  or itching[  ]; Musculosketetal: myalgias[  ];  joint swelling[  ];  joint erythema[  ];  joint pain[  ];  back pain[  ];  Heme/Lymph: bruising[  ];  bleeding[  ];  anemia[  ];  Neuro: TIA[  ];  headaches[  ];  stroke[  ];  vertigo[  ];  seizures[  ];   paresthesias[  ];  difficulty walking[  ];  Psych:depression[  ]; anxiety[  ];  Endocrine: diabetes[n  ];  thyroid dysfunction[  ];  Immunizations: Flu up to date [ y ]; Pneumococcal up to date [ n ];  Other:  Physical Exam: BP 126/86 (BP Location: Right Arm, Patient Position: Sitting, Cuff Size: Normal)   Pulse 92   Resp 20   Ht 5\' 7"  (1.702 m)   Wt 167 lb (75.8 kg)   SpO2 98% Comment: RA  BMI 26.16 kg/m   PHYSICAL EXAMINATION: General appearance: alert, cooperative and no distress Head: Normocephalic, without obvious abnormality, atraumatic Neck: no adenopathy, no carotid bruit, no JVD, supple, symmetrical, trachea midline, thyroid not enlarged, symmetric, no tenderness/mass/nodules and There is thickening of the skin of the neck but node definite palpable cervical adenopathy or cervical mass Lymph nodes: Cervical, supraclavicular, and axillary nodes normal. Resp: clear to auscultation bilaterally Back: symmetric, no  curvature. ROM normal. No CVA tenderness. Cardio: regular rate and rhythm, S1, S2 normal, no murmur, click, rub or gallop GI: soft, non-tender; bowel sounds normal; no masses,  no organomegaly Extremities: extremities normal, atraumatic, no cyanosis or edema and Homans sign is negative, no sign of DVT Neurologic: Grossly normal  Diagnostic Studies & Laboratory data:     Recent Radiology Findings:  Ct Chest Wo Contrast  Result Date: 02/20/2016 CLINICAL DATA:  Followup lung mass EXAM: CT CHEST WITHOUT CONTRAST TECHNIQUE: Multidetector CT imaging of the chest was performed following the standard protocol without IV contrast. COMPARISON:  10/24/2015 FINDINGS: Cardiovascular: Aortic atherosclerosis. Normal heart size. No pericardial effusion. Mediastinum/Nodes: The trachea appears patent and is midline. Normal appearance of the esophagus. No enlarged mediastinal or hilar lymph nodes. Lungs/Pleura: No pleural effusion. Mild changes of centrilobular emphysema identified. The sub solid lesion within the left upper lobe Measures 2.2 cm, image 28 of series 4. This is unchanged from previous exam. The internal solid component Measures 6 mm, image 24  of series 4. Previously 7 mm. No new pulmonary nodules or masses identified. Upper Abdomen: No acute abnormality. Musculoskeletal: No chest wall mass or suspicious bone lesions identified. IMPRESSION: 1. No acute cardiopulmonary abnormalities. 2. Stable part solid nodule within the left upper lobe which remains suspicious for low-grade adenocarcinoma (atypical adenomatous hyperplasia). 3. Emphysema 4. Aortic atherosclerosis. Electronically Signed   By: Kerby Moors M.D.   On: 02/20/2016 11:45   Nm Pet Image Initial (pi) Skull Base To Thigh  10/24/2015  CLINICAL DATA:  Subsequent treatment strategy for lung mass. History of tongue cancer. EXAM: NUCLEAR MEDICINE PET SKULL BASE TO THIGH TECHNIQUE: 8.4 mCi F-18 FDG was injected intravenously. Full-ring PET imaging was  performed from the skull base to thigh after the radiotracer. CT data was obtained and used for attenuation correction and anatomic localization. FASTING BLOOD GLUCOSE:  Value: 99 mg/dl COMPARISON:  Chest CT 10/07/2015. Abdominal pelvic CT of 09/18/2015. Most recent PET of 07/03/2014. FINDINGS: NECK Extensive muscular activity throughout the neck. Given this factor, no cervical hypermetabolic nodes identified. Relatively diffuse tongue hypermetabolism is without CT correlate and most likely due to motion after radiopharmaceutical injection. Similarly, hypermetabolism about the posterior aspect of the larynx is symmetric and likely physiologic. CHEST Low-level hypermetabolism which corresponds to the sub solid left apical pulmonary nodule. This measures 2.8 cm and a S.U.V. max of 1.7 on image 14/series 8. compare a S.U.V. max of 2.2 on the 07/03/2014 exam. No thoracic nodal hypermetabolism. ABDOMEN/PELVIS No areas of abnormal hypermetabolism. SKELETON Focus of hypermetabolism about the anterior right acetabulum is favored to be related to tendinous strain at the origin of thigh extensor musculature. This measures a S.U.V. max of 5.8. Similarly, there is hypermetabolism about the anterior left femoral head/ neck junction which is favored to be due to adjacent muscular strain. No well-defined osseous lesion in either site. CT IMAGES PERFORMED FOR ATTENUATION CORRECTION No cervical adenopathy. Bilateral carotid atherosclerosis. Thoracic aortic and branch vessel atherosclerosis. LAD coronary artery atherosclerosis. Chest findings deferred to recent diagnostic CT. Lower pole right renal fluid density lesion is likely a cyst. Abdominal aortic atherosclerosis. Cholecystectomy. Hysterectomy. IMPRESSION: 1. Low-level, non malignant range hypermetabolism corresponding to the left apical sub solid pulmonary nodule. This remains suspicious for low-grade adenocarcinoma, given morphology and interval enlargement on prior  diagnostic CT. 2. Diffuse tongue hypermetabolism without CT correlate. Recommend physical exam correlation to confirm physiologic or related to motion after pharmaceutical injection. 3. No evidence of hypermetabolic nodes to suggest metastatic disease. 4.  Coronary artery atherosclerosis. Aortic atherosclerosis. Electronically Signed   By: Abigail Miyamoto M.D.   On: 10/24/2015 10:09      CLINICAL DATA: Followup of pulmonary nodule. Throat cancer in 2015 with chemotherapy and radiation therapy.  EXAM: CT CHEST WITHOUT CONTRAST  TECHNIQUE: Multidetector CT imaging of the chest was performed following the standard protocol without IV contrast.  COMPARISON: Chest radiograph 09/18/2015. Abdominal pelvic CT 09/18/2015. Chest CT 04/05/2015.  FINDINGS: Mediastinum/Nodes: Aortic and branch vessel atherosclerosis. Normal heart size, without pericardial effusion. LAD coronary artery atherosclerosis. No mediastinal or definite hilar adenopathy, given limitations of unenhanced CT.  Lungs/Pleura: No pleural fluid. Biapical pleural-parenchymal scarring. A 3 mm right upper lobe pulmonary nodule on image 29/series 4 is felt to be similar to on the prior exam. There are smaller right apical pulmonary nodules, including at 3 mm on image 30/ series 4, which are likely more apparent today secondary to differences in slice thickness.  A 3 mm right lower lobe subpleural nodule on image  69/series 4 similar.  Sub solid left apical pulmonary nodule measures 2.9 x 2.9 cm on image 30/series 4. Compare 2.0 x 1.5 cm on the most recent exam. The solid components measure on the order of 8 mm on image 27/series 4 and are also felt to be increased.  Upper abdomen: Cholecystectomy. Normal imaged portions of the liver, spleen, stomach, pancreas, adrenal glands, kidneys.  Musculoskeletal: Nodularity in the lateral left breast measures 1.3 cm on image 70/series 2 and is unchanged. No acute  osseous abnormality.  IMPRESSION: 1. Increase in size of a sub solid left apical pulmonary nodule. This remains suspicious for a low-grade adenocarcinoma, which had regressed on the prior exam secondary to chemotherapy. 2. Other pulmonary nodules are primarily similar. Some right apical nodules are more conspicuous today, favored to be due to differences in slice thickness. 3. Atherosclerosis, including within the coronary arteries. 4. Similar left breast nodule which could be re-evaluated at followup or more entirely characterized with mammogram/ultrasound. 5. No thoracic adenopathy to suggest nodal metastasis.   Electronically Signed By: Abigail Miyamoto M.D. On: 10/07/2015 14:02  Final Report  CLINICAL DATA: 75 year old female with history of multiple pulmonary nodules. Followup study. Additional history of tongue cancer.  EXAM: CT CHEST WITH CONTRAST  TECHNIQUE: Multidetector CT imaging of the chest was performed during intravenous contrast administration.  CONTRAST: 60 mL of Isovue 370.  COMPARISON: Chest CT 01/04/2015. PET-CT 07/03/2014.  FINDINGS: Mediastinum/Lymph Nodes: Heart size is normal. There is no significant pericardial fluid, thickening or pericardial calcification. There is atherosclerosis of the thoracic aorta, the great vessels of the mediastinum and the coronary arteries, including calcified atherosclerotic plaque in the left anterior descending coronary artery. No pathologically enlarged mediastinal or hilar lymph nodes. Esophagus is unremarkable in appearance. No axillary lymphadenopathy. Right internal jugular single-lumen porta cath with tip terminating in the distal superior vena cava.  Lungs/Pleura: Again noted is an ill-defined nodular area in the apex of the left upper lobe, which is predominantly ground-glass attenuation (image 15 of series 4) measuring 2.0 x 1.5 cm (slightly smaller than prior study 01/04/2015 at which point this measured  2.1 x 2.2 cm), with a tiny central solid component measuring only 4 mm (image 15 of series 2), which is also slightly less apparent than prior studies. No other new suspicious appearing pulmonary nodules or masses are identified. The no acute consolidative airspace disease. No pleural effusions.  Upper Abdomen: Status post cholecystectomy.  Musculoskeletal/Soft Tissues: There are no aggressive appearing lytic or blastic lesions noted in the visualized portions of the skeleton.  IMPRESSION: 1. Previously described mixed solid and sub solid nodule in the apex of the left upper lobe is less apparent than prior examinations. Although the regression is reassuring, based on behavior on prior examinations, this remains concerning for potential low-grade adenocarcinoma, and the regression may simply reflect response to chemotherapy for the patient's head neck cancer. Continued attention on future followup examinations is recommended. 2. No other suspicious appearing pulmonary nodules or masses in the lungs at this time to suggest metastatic disease. 3. Atherosclerosis, including left anterior descending coronary artery disease. Please note that although the presence of coronary artery calcium documents the presence of coronary artery disease, the severity of this disease and any potential stenosis cannot be assessed on this non-gated CT examination. Assessment for potential risk factor modification, dietary therapy or pharmacologic therapy may be warranted, if clinically indicated. 4. Additional incidental findings, as above.   Electronically Signed By: Vinnie Langton M.D. On: 04/05/2015 13:17  Recent Lab Findings: Lab Results  Component Value Date   WBC 2.6 (L) 10/28/2015   HGB 13.6 10/28/2015   HCT 40.2 10/28/2015   PLT 129 (L) 10/28/2015   GLUCOSE 103 (H) 10/28/2015   ALT 18 10/28/2015   AST 21 10/28/2015   NA 139 10/28/2015   K 4.0 10/28/2015   CL 104 10/28/2015    CREATININE 0.84 10/28/2015   BUN 17 10/28/2015   CO2 26 10/28/2015   INR 1.00 10/28/2015   PFT's :  FEV1 2.10 85% DLCO 16.75 59%  1. spirometry shows minimal airflow obstruction without bronchodilator improvement. 2.lung volumes are normal. 3.airway resistance is normal. 4. DLCO moderately reduced   Assessment / Plan:   1/ History of squamous cell carcinoma of the tongue, treated with radiation and chemotherapy 2015 2/Stable groundglass left apical pulmonary nodule suspicious for a low-grade adenocarcinoma,  biopsy by navigation bronchoscopy June 2017 was negative for malignancy. Patient returns today with a follow-up CT scan that shows the area stable. I discussed with the patient that this area is still suspicious for a slow growing malignancy. She is very concerned about any operative intervention primarily because of her difficulty swallowing. She is agreeable to a follow-up CT scan in 6 months to confirm stability.   3/ Atherosclerosis, including left anterior descending coronary     artery disease.- by ct    We'll plan to see the patient back in 6 months with a follow-up CT scan of the chest.  Candace Isaac MD      [address] [place] [address] Office [phone]   [facility]eeper [phone]  02/20/2016 2:48 PM

## 2016-03-03 ENCOUNTER — Encounter (HOSPITAL_COMMUNITY): Payer: Medicare Other | Attending: Hematology & Oncology | Admitting: Adult Health

## 2016-03-03 VITALS — Wt 168.6 lb

## 2016-03-03 DIAGNOSIS — R1310 Dysphagia, unspecified: Secondary | ICD-10-CM

## 2016-03-03 DIAGNOSIS — K117 Disturbances of salivary secretion: Secondary | ICD-10-CM

## 2016-03-03 DIAGNOSIS — C01 Malignant neoplasm of base of tongue: Secondary | ICD-10-CM

## 2016-03-03 DIAGNOSIS — Z85810 Personal history of malignant neoplasm of tongue: Secondary | ICD-10-CM | POA: Diagnosis not present

## 2016-03-03 DIAGNOSIS — K5900 Constipation, unspecified: Secondary | ICD-10-CM

## 2016-03-03 DIAGNOSIS — F411 Generalized anxiety disorder: Secondary | ICD-10-CM

## 2016-03-03 DIAGNOSIS — C029 Malignant neoplasm of tongue, unspecified: Secondary | ICD-10-CM | POA: Insufficient documentation

## 2016-03-03 DIAGNOSIS — R112 Nausea with vomiting, unspecified: Secondary | ICD-10-CM

## 2016-03-03 NOTE — Progress Notes (Signed)
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      01/19/2014 PET scan    Tongue mass with 2 level II Right neck nodes T2N2b, Stage IVA      02/08/2014 - 04/03/2014 Radiation Therapy    Initiation of radiation therapy with 70 Gy delivered to the right base of tongue/tonsil 63 Gy to high risk nodal echelons, 56 Gy to the intermediate r base of tongue/tonsil/bilateral neck nodes      02/08/2014 - 03/15/2014 Chemotherapy    weekly cisplatin, only 4 doses given, severe toxicity with neutropenia, dehydration, nausea, vomiting, hospitalization and obstipation      10/24/2015 PET scan    low level non malig range hyper metab in L apical sub solid pulm nodule, suspicious for low grade adeno. 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 Her biggest concerns today are chronic vomiting, as well as her swallowing issues. She sees Dr. Britta Mccreedy with GI who is helping manage her vomiting.  This has been a longstanding issue for Candace Lewis.  She tells me she has had a lot of acid in her emesis recently; she takes Omeprazole. She stopped taking the Phenergan because it gave her headaches. She now takes Zofran around-the-clock at 7 AM, 3 PM, and 11 PM. She is unable to tolerate any solid foods at all at this time. She is able to drink tomato juice in  small amounts. Her general nutrition regimen includes: 8 ounces of Ensure mixed with 4 ounces of milk several times a day. She tells me she drinks plenty of water every day as well. Overall, her weight has been stable within a few pounds.  She was started on Lexapro by Dr. Whitney Muse at her last visit to the cancer center.  The patient stopped the medication on her own because she didn't like the way it made her feel; she tells me she had very strange dreams and flashbacks to her childhood. She tells me that she does not feel like she is anxious or depressed. She does not like to take any medicine that makes her "feel out of control."  Her appetite is good, but she has difficulty with dysphagia; "I feel like foods get stuck in my throat."  She tells me that she was told by Dr. Britta Mccreedy that he could not do esophageal dilation for her; she tells me the reasoning was due to her scar tissue from her previous head and neck cancer treatments. 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  She tells me her hiccups are improving.   She last saw Dr. Benjamine Mola, her ENT physician, on 01/16/16. She tells me she was told that everything looked good on her exam that day. She saw Dr. Servando Snare with  cardiothoracic surgery recently and was told to return in 6 months with repeat CT chest to continue to monitor benign lung nodule.   She sees her PCP, Dr. Melina Copa, regularly. She takes Tylenol #3 for chronic back pain. She tells me she feels constipated; she does move her bowels daily, but the stool is hard and small volume.     ADDITIONAL REVIEW OF SYSTEMS:  ROS, per HPI  14-point review of systems was completed and was negative or non-contributory.    PAST MEDICAL & SURGICAL HISTORY:  Past Medical History:  Diagnosis Date  . Arthritis    lower back  . Constipation   . Diverticulitis   . Ectopic pregnancy 1977  . 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 GERD (gastroesophageal reflux disease)   . Headache    migraines  . Hypertension    no longer taking meds  . Hypokalemia 2015  . Lung nodule, multiple 01/08/2015  . Macular degeneration of right eye   . Malignant neoplasm of tongue ([place]) 01/24/2014  . Thyroid disease    Past Surgical History:  Procedure Laterality Date  . BIOPSY PHARYNX  2015  . CHOLECYSTECTOMY  2005  . FUDUCIAL PLACEMENT N/A 10/28/2015   Procedure: PLACEMENT OF FUDUCIAL TIMES THREE; LEFT UPPER LOBE BIOPSY;  Surgeon: Grace Isaac, MD;  Location: [facility];  Service: Thoracic;  Laterality: N/A;  . MASTECTOMY, PARTIAL  1984  . SKIN SURGERY  1960   moles removed from various areas per patient report  . TONSILLECTOMY  1950   per patient report  . TUBAL LIGATION  1976  . VAGINAL HYSTERECTOMY  1981   vaginal hyst per patient report  . VIDEO BRONCHOSCOPY WITH ENDOBRONCHIAL NAVIGATION N/A 10/28/2015   Procedure: VIDEO BRONCHOSCOPY WITH ENDOBRONCHIAL NAVIGATION;  Surgeon: Grace Isaac, MD;  Location: [facility];  Service: Thoracic;  Laterality: N/A;     SOCIAL HISTORY: Candace Lewis is married and lives in [place], [place]. She denies any current tobacco or alcohol use.     CURRENT MEDICATIONS:  Current Outpatient Prescriptions on File Prior to Visit  Medication Sig Dispense Refill  . acetaminophen-codeine (TYLENOL #3) 300-30 MG tablet Take 1 tablet by mouth every 6 (six) hours as needed for moderate pain.    Marland Kitchen docusate sodium (COLACE) 100 MG capsule Take by mouth.    . levothyroxine (SYNTHROID, LEVOTHROID) 25 MCG tablet Take 50 mcg by mouth daily before breakfast.     . omeprazole (PRILOSEC) 20 MG capsule Take 1 capsule (20 mg total) by mouth daily. 30 capsule 3  . promethazine (PHENERGAN) 25 MG tablet Take 1 tablet (25 mg total) by mouth every 6 (six) hours as needed for nausea or vomiting. 60 tablet 1   No current facility-administered medications on file prior to visit.     ALLERGIES: Allergies  Allergen  Reactions  . Adhesive [Tape] Rash    Reports rash and blistering if any adhesive tape  . Latex Rash    Rash and skin blistering per patient report    PHYSICAL EXAM:  General: Female in no acute distress. Unaccompanied today.    HEENT: Head is normocephalic.  Pupils equal and reactive to light. Conjunctivae clear without exudate.  Sclerae anicteric. Edentulous.  Tongue with white, patchy covering consistent with thrush. Significant xerostomia.  Lymph: No cervical, supraclavicular,or infraclavicular lymphadenopathy noted on palpation.  Neck: Diffuse fibrosis noted to anterior neck. No palpable masses.  Cardiovascular: Normal rate and rhythm.  Respiratory: Clear to  auscultation bilaterally. Chest expansion symmetric without accessory muscle use. Breathing non-labored.    GU: Deferred.   GI: Soft, non-tender abdomen. Normoactive bowel sounds.  Neuro: No focal deficits. Steady gait.   Psych: Anxious mood.  Extremities: No edema. Skin: Warm and dry.   LABORATORY DATA: None for this visit.   DIAGNOSTIC IMAGING:  None for this visit.    ASSESSMENT & PLAN:  Ms. Zinke is a pleasant 75 y.o. female with history of Stage IVA (T2N2bM0), diagnosed in 01/2014; treated with concurrent chemoradiation; completed treatment on 04/03/14.  Patient presents to survivorship clinic today for long-term survivorship visit and routine cancer surveillance.    1. Base of tongue cancer: Ms. Lienhart remains clinically without evidence of disease or recurrence.  We discussed the importance of continued surveillance for head & neck cancer.  She saw Dr. Benjamine Mola (ENT) on 01/16/16 and his progress note from that visit was reviewed today; flexible laryngoscopy was gone at that time and showed no evidence of disease.     Upcoming follow-up (per patient):  -PCP: 05/2016 -ENT: 07/2016 -Radiation Oncologist: 08/2016 -Dr. Servando Snare: 08/2016 with repeat CT chest (to follow-up on benign lung nodule)  2. Nausea & vomiting/Dysphagia:  Ms. Encinas continues to struggle with chronic nausea and vomiting, as well as dysphasia. I will certainly defer to her GI specialist with regards to possible esophageal dilation. I did encourage her to talk with Dr. Britta Mccreedy next time she sees him, to see if there may be additional interventions to help improve her symptoms. We also discussed that certainly anxiety completing a role in her nausea and vomiting, as well as the fear of choking associated with dysphagia. I validated her concerns today a shared with her that they are very common symptoms of head and neck cancer survivors. I did encourage her to try some soft foods and tuck her chin with swallowing.  Also encouraged her to resume her previously taught speech therapy exercises, as strengthening the neck muscles may help improve her symptoms as well.   3. 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 She declined any pharmacologic management with Diflucan.  She stated she prefers to manage the thrush with more conservative measures that she tells me she already has at home, including mouthwashes, brushing her tongue, troches, etc. I encouraged her to let us know if her symptoms do not improve with her conservative measures, as thrush is extremely common in head & neck cancer patients and often warrants prescription medication management to resolve oral fungal infections. She agreed with this plan.  4. Constipation: Constipation can certainly play a role in chronic nausea and vomiting. I stressed the importance of having an adequate bowel regimen, particularly in the setting of intermittent opiate use. I recommended that she continue using the Colace, but recommended that she add MiraLAX as well. We discussed that she can titrate the dose of MiraLAX as necessary to take a dose every day or every other day, with the goal of having a bowel movement daily or every other day.   5. Anxiety:  This Chronis denies any current concerns with anxiety. However, she does clinically appear anxious. We discussed that potentially anxiety is playing a role in her nausea and vomiting. We discussed that in the absence of a mechanical/anatomic reason for her nausea and vomiting, anxiety could certainly be playing a role. I offered to provide her with a prescription for low-dose Ativan, for her to use as needed,  particularly around mealtimes.  We discussed that Ativan is often used in the management of nausea and vomiting, as well as helping improve anxiety symptoms. She declined any pharmacologic management today, as she reports not liking the way these medications make her feel. I provided support today with active listening and validation of her concerns.     Dispo:  -Return to [facility] on 03/18/16.   A total of 35 minutes was spent in face-to-face care of this patient, with greater than 50% of that time spent in counseling and care coordination.   Mike Craze, NP Survivorship Program [facility] [phone]

## 2016-03-18 ENCOUNTER — Encounter (HOSPITAL_COMMUNITY): Payer: Medicare Other | Attending: Hematology & Oncology | Admitting: Hematology & Oncology

## 2016-03-18 ENCOUNTER — Encounter (HOSPITAL_COMMUNITY): Payer: Medicare Other

## 2016-03-18 ENCOUNTER — Encounter (HOSPITAL_COMMUNITY): Payer: Self-pay | Admitting: Hematology & Oncology

## 2016-03-18 VITALS — BP 134/80 | HR 98 | Temp 98.4°F | Resp 16 | Wt 168.6 lb

## 2016-03-18 DIAGNOSIS — Z85810 Personal history of malignant neoplasm of tongue: Secondary | ICD-10-CM

## 2016-03-18 DIAGNOSIS — K5732 Diverticulitis of large intestine without perforation or abscess without bleeding: Secondary | ICD-10-CM

## 2016-03-18 DIAGNOSIS — R911 Solitary pulmonary nodule: Secondary | ICD-10-CM

## 2016-03-18 DIAGNOSIS — C029 Malignant neoplasm of tongue, unspecified: Secondary | ICD-10-CM | POA: Diagnosis present

## 2016-03-18 DIAGNOSIS — R1310 Dysphagia, unspecified: Secondary | ICD-10-CM | POA: Diagnosis not present

## 2016-03-18 DIAGNOSIS — D696 Thrombocytopenia, unspecified: Secondary | ICD-10-CM

## 2016-03-18 DIAGNOSIS — R112 Nausea with vomiting, unspecified: Secondary | ICD-10-CM | POA: Diagnosis not present

## 2016-03-18 LAB — CBC WITH DIFFERENTIAL/PLATELET
Basophils Absolute: 0 10*3/uL (ref 0.0–0.1)
Basophils Relative: 1 %
Eosinophils Absolute: 0.1 10*3/uL (ref 0.0–0.7)
Eosinophils Relative: 3 %
HCT: 40.6 % (ref 36.0–46.0)
Hemoglobin: 13.9 g/dL (ref 12.0–15.0)
Lymphocytes Relative: 20 %
Lymphs Abs: 0.7 10*3/uL (ref 0.7–4.0)
MCH: 29.3 pg (ref 26.0–34.0)
MCHC: 34.2 g/dL (ref 30.0–36.0)
MCV: 85.7 fL (ref 78.0–100.0)
Monocytes Absolute: 0.4 10*3/uL (ref 0.1–1.0)
Monocytes Relative: 12 %
Neutro Abs: 2.3 10*3/uL (ref 1.7–7.7)
Neutrophils Relative %: 64 %
Platelets: 138 10*3/uL — ABNORMAL LOW (ref 150–400)
RBC: 4.74 MIL/uL (ref 3.87–5.11)
RDW: 13.2 % (ref 11.5–15.5)
WBC: 3.5 10*3/uL — ABNORMAL LOW (ref 4.0–10.5)

## 2016-03-18 LAB — TSH: TSH: 6.86 u[IU]/mL — ABNORMAL HIGH (ref 0.350–4.500)

## 2016-03-18 LAB — COMPREHENSIVE METABOLIC PANEL
ALT: 14 U/L (ref 14–54)
AST: 23 U/L (ref 15–41)
Albumin: 4 g/dL (ref 3.5–5.0)
Alkaline Phosphatase: 72 U/L (ref 38–126)
Anion gap: 7 (ref 5–15)
BUN: 21 mg/dL — ABNORMAL HIGH (ref 6–20)
CO2: 28 mmol/L (ref 22–32)
Calcium: 9.6 mg/dL (ref 8.9–10.3)
Chloride: 105 mmol/L (ref 101–111)
Creatinine, Ser: 0.82 mg/dL (ref 0.44–1.00)
GFR calc Af Amer: >60 mL/min (ref >60)
GFR calc non Af Amer: >60 mL/min (ref >60)
Glucose, Bld: 99 mg/dL (ref 65–99)
Potassium: 3.6 mmol/L (ref 3.5–5.1)
Sodium: 140 mmol/L (ref 135–145)
Total Bilirubin: 0.5 mg/dL (ref 0.3–1.2)
Total Protein: 6.9 g/dL (ref 6.5–8.1)

## 2016-03-18 MED ORDER — PROMETHAZINE HCL 25 MG PO TABS: 25.0000 mg | ORAL_TABLET | Freq: Four times a day (QID) | ORAL | 1 refills | Status: DC | PRN

## 2016-03-18 NOTE — Patient Instructions (Addendum)
[place] at [facility] Discharge Instructions  RECOMMENDATIONS MADE BY THE CONSULTANT AND ANY TEST RESULTS WILL BE SENT TO YOUR REFERRING PHYSICIAN.  You saw Dr.Penland today. Follow up in 4 months with labs. Phenergan refilled. See Amy at checkout for appointments.  Thank you for choosing [facility] at [facility] to provide your oncology and hematology care.  To afford each patient quality time with our provider, please arrive at least 15 minutes before your scheduled appointment time.   Beginning January 23rd 2017 lab work for the [facility] will be done in the  Main lab at [facility] on 1st floor. If you have a lab appointment with the [facility] please come in thru the  Main Entrance and check in at the main information desk  You need to re-schedule your appointment should you arrive 10 or more minutes late.  We strive to give you quality time with our providers, and arriving late affects you and other patients whose appointments are after yours.  Also, if you no show three or more times for appointments you may be dismissed from the clinic at the providers discretion.     Again, thank you for choosing [facility].  Our hope is that these requests will decrease the amount of time that you wait before being seen by our physicians.       _____________________________________________________________  Should you have questions after your visit to [facility], please contact our office at [phone] between the hours of 8:30 a.m. and 4:30 p.m.  Voicemails left after 4:30 p.m. will not be returned until the following business day.  For prescription refill requests, have your pharmacy contact our office.         Resources For Cancer Patients and their Caregivers ? American Cancer Society: Can assist with transportation, wigs, general needs, runs Look Good Feel Better.        564 334 0646 ? Cancer  Care: Provides financial assistance, online support groups, medication/co-pay assistance.  1-800-813-HOPE 561 687 7420) ? [facility] Assists [place] cancer patients and their families through emotional , educational and financial support.  8623042464 ? [place] DSS Where to apply for food stamps, Medicaid and utility assistance. 786-590-9348 ? [facility]: Transportation to medical appointments. 828-322-3966 ? Social Security Administration: May apply for disability if have a Stage IV cancer. 228 796 2320 938-632-1794 ? [employer], Disability and Transit Services: Assists with nutrition, care and transit needs. [facility] Support Programs: @10RELATIVEDAYS @ > Cancer Support Group  2nd Tuesday of the month 1pm-2pm, Journey Room  > Creative Journey  3rd Tuesday of the month 1130am-1pm, Journey Room  > Look Good Feel Better  1st Wednesday of the month 10am-12 noon, Journey Room (Call [facility] to register [phone])

## 2016-03-18 NOTE — Progress Notes (Signed)
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      01/19/2014 PET scan    Tongue mass with 2 level II Right neck nodes T2N2b, Stage IVA      02/08/2014 - 04/03/2014 Radiation Therapy    Initiation of radiation therapy with 70 Gy delivered to the right base of tongue/tonsil 63 Gy to high risk nodal echelons, 56 Gy to the intermediate r base of tongue/tonsil/bilateral neck nodes      02/08/2014 - 03/15/2014 Chemotherapy    weekly cisplatin, only 4 doses given, severe toxicity with neutropenia, dehydration, nausea, vomiting, hospitalization and obstipation      10/24/2015 PET scan    low level non malig range hyper metab in L apical sub solid pulm nodule, suspicious for low grade adeno. diffuse tongue hypermetab without CT correlation      10/28/2015 Procedure    Bronchoscopy navigation, electromagnetic navigation, bronchoscopy with biopsy of LUL lung lesion, placement of fiducial markers X 3      10/28/2015 Pathology Results    Scant benign lung tissue, no tumor seen       HISTORY OF PRESENTING ILLNESS:  Candace Lewis 75 y.o. female is here because of referral from Dr. Melina Lewis for a history of head and neck cancer and a new LUL nodule. She has just seen Dr. Verlene Lewis in consultation for the lung nodule. Per records the nodule measured 2 x 1.5 cm and has grown to 2.9 x 2.9 cm over a 6 month period. She was a previous smoker but quit many years ago in 1983. Lung biopsy has been biopsied and was not malignant. Plan is for ongoing follow-up. She goes by [name]".   She last saw Candace Lewis in October 2017.  Candace Lewis returns to the [facility] today unaccompanied.   She states that she isn't vomiting as much and the hiccups are  better.  She does not like the new medication she's on. She said she had a red rash up her arms.  She still has a dry mouth. She has been putting her chin down and swallowing to help with that. She continues to have ongoing complaints related to her prior treatment of her head and neck cancer. She has been drinking tomato juice, but she feels dehydrated. If she drinks too much, it comes back up. She has had difficulty eating. She says that she is very hungry but it is painful to swallow anything solid. She has been drinking her Ensure and milk. Weight is stable.   She is opting for radiation before lung surgery if needed. She doesn't think she's in the right place for surgery.   She has received her flu shot.   "Other than that, I'm doing good".   MEDICAL HISTORY:  Past Medical History:  Diagnosis Date  . Arthritis    lower back  . Constipation   . Diverticulitis   . Ectopic pregnancy 1977  . 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 GERD (gastroesophageal reflux disease)   . Headache    migraines  . Hypertension    no longer taking meds  . Hypokalemia 2015  . Lung nodule, multiple 01/08/2015  . Macular degeneration of right eye   .  Malignant neoplasm of tongue ([place]) 01/24/2014  . Thyroid disease     SURGICAL HISTORY: Past Surgical History:  Procedure Laterality Date  . BIOPSY PHARYNX  2015  . CHOLECYSTECTOMY  2005  . FUDUCIAL PLACEMENT N/A 10/28/2015   Procedure: PLACEMENT OF FUDUCIAL TIMES THREE; LEFT UPPER LOBE BIOPSY;  Surgeon: Grace Isaac, MD;  Location: [facility];  Service: Thoracic;  Laterality: N/A;  . MASTECTOMY, PARTIAL  1984  . SKIN SURGERY  1960   moles removed from various areas per patient report  . TONSILLECTOMY  1950   per patient report  . TUBAL LIGATION  1976  . VAGINAL HYSTERECTOMY  1981   vaginal hyst per patient report  . VIDEO BRONCHOSCOPY WITH ENDOBRONCHIAL NAVIGATION N/A 10/28/2015   Procedure: VIDEO BRONCHOSCOPY WITH  ENDOBRONCHIAL NAVIGATION;  Surgeon: Grace Isaac, MD;  Location: [facility];  Service: Thoracic;  Laterality: N/A;    SOCIAL HISTORY: Social History   Social History  . Marital status: Married    Spouse name: N/A  . Number of children: N/A  . Years of education: N/A   Occupational History  . Not on file.   Social History Main Topics  . Smoking status: Former Smoker    Packs/day: 1.00    Years: 23.00    Types: Cigarettes    Quit date: 05/03/1982  . Smokeless tobacco: Never Used  . Alcohol use No  . Drug use: No  . Sexual activity: Not on file   Other Topics Concern  . Not on file   Social History Narrative  . No narrative on file   Married 0 children Ex-smoker. Smoked from 58 years old to 50.  ETOH, none. 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  FAMILY HISTORY: History reviewed. No pertinent family history.  Father deceased at 71 yo. He had cirrhosis of the liver and was a diabetic Mother died of emphysema secondary to smoking at 72 yo 2 brothers. 1 brother passed with alcoholism. Doesn't talk much to her other brother.  ALLERGIES:  is allergic to adhesive [tape] and latex.  MEDICATIONS:  Current Outpatient Prescriptions  Medication Sig Dispense Refill  . acetaminophen-codeine (TYLENOL #3) 300-30 MG tablet Take 1 tablet by mouth every 6 (six) hours as needed for moderate pain.    Marland Kitchen docusate sodium (COLACE) 100 MG capsule Take by mouth.    . levothyroxine (SYNTHROID, LEVOTHROID) 25 MCG tablet Take 50 mcg by mouth daily before breakfast.     . omeprazole (PRILOSEC) 20 MG capsule Take 1 capsule (20 mg total) by mouth daily. 30 capsule 3  . ondansetron (ZOFRAN-ODT) 8 MG disintegrating tablet     . promethazine (PHENERGAN) 25 MG tablet Take 1 tablet (25 mg total) by mouth every 6 (six) hours as needed for nausea or vomiting. 60 tablet 1   No current facility-administered medications for this visit.     Review of Systems   Constitutional: Negative.        Dehydrated  HENT: Negative.        Hiccups. Sputum in throat. Hurts to stick her tongue out. Dry mouth. Pain with swallowing.   Eyes: Negative.   Respiratory: Negative.   Cardiovascular: Negative.   Gastrointestinal: Positive for vomiting.  Genitourinary: Negative.   Musculoskeletal: Negative.   Skin: Positive for rash.       Red rash up her arms and a smell from new medication.  Neurological: Negative.   Endo/Heme/Allergies: Negative.   Psychiatric/Behavioral: Negative.   All  other systems reviewed and are negative.  14 point ROS was done and is otherwise as detailed above or in HPI    PHYSICAL EXAMINATION: ECOG PERFORMANCE STATUS: 1 - Symptomatic but completely ambulatory  Vitals:   03/18/16 1143  BP: 134/80  Pulse: 98  Resp: 16  Temp: 98.4 F (36.9 C)   Filed Weights   03/18/16 1143  Weight: 168 lb 9.6 oz (76.5 kg)      Physical Exam  Constitutional: She is oriented to person, place, and time and well-developed, well-nourished, and in no distress.  Was able to get on exam table without assistance.   HENT:  Head: Normocephalic and atraumatic.  Nose: Nose normal.  Mouth/Throat: Oropharynx is clear and moist. No oropharyngeal exudate.  Eyes: Conjunctivae and EOM are normal. Pupils are equal, round, and reactive to light. Right eye exhibits no discharge. Left eye exhibits no discharge. No scleral icterus.  Neck: Normal range of motion. Neck supple. No tracheal deviation present. No thyromegaly present.  Cardiovascular: Normal rate, regular rhythm and normal heart sounds.  Exam reveals no gallop and no friction rub.   No murmur heard. Pulmonary/Chest: Effort normal and breath sounds normal. She has no wheezes. She has no rales.  Abdominal: Soft. Bowel sounds are normal. She exhibits no distension and no mass. There is no tenderness. There is no rebound and no guarding.  Musculoskeletal: Normal range of motion. She exhibits no  edema.  Lymphadenopathy:    She has no cervical adenopathy.  Neurological: She is alert and oriented to person, place, and time. She has normal reflexes. No cranial nerve deficit. Gait normal. Coordination normal.  Skin: Skin is warm and dry. No rash noted.  Psychiatric: Mood, memory, affect and judgment normal.  Nursing note and vitals reviewed.   LABORATORY DATA:  I have reviewed the data as listed Lab Results  Component Value Date   WBC 3.5 (L) 03/18/2016   HGB 13.9 03/18/2016   HCT 40.6 03/18/2016   MCV 85.7 03/18/2016   PLT 138 (L) 03/18/2016   CMP     Component Value Date/Time   NA 139 10/28/2015 1013   K 4.0 10/28/2015 1013   CL 104 10/28/2015 1013   CO2 26 10/28/2015 1013   GLUCOSE 103 (H) 10/28/2015 1013   BUN 17 10/28/2015 1013   CREATININE 0.84 10/28/2015 1013   CALCIUM 9.5 10/28/2015 1013   PROT 6.2 (L) 10/28/2015 1013   ALBUMIN 3.5 10/28/2015 1013   AST 21 10/28/2015 1013   ALT 18 10/28/2015 1013   ALKPHOS 57 10/28/2015 1013   BILITOT 0.4 10/28/2015 1013   GFRNONAA >60 10/28/2015 1013   GFRAA >60 10/28/2015 1013     RADIOGRAPHIC STUDIES: I have personally reviewed the radiological images as listed and agreed with the findings in the report.  Study Result   CLINICAL DATA:  Followup lung mass  EXAM: CT CHEST WITHOUT CONTRAST  TECHNIQUE: Multidetector CT imaging of the chest was performed following the standard protocol without IV contrast.  COMPARISON:  10/24/2015  FINDINGS: Cardiovascular: Aortic atherosclerosis. Normal heart size. No pericardial effusion.  Mediastinum/Nodes: The trachea appears patent and is midline. Normal appearance of the esophagus. No enlarged mediastinal or hilar lymph nodes.  Lungs/Pleura: No pleural effusion. Mild changes of centrilobular emphysema identified. The sub solid lesion within the left upper lobe Measures 2.2 cm, image 28 of series 4. This is unchanged from previous exam. The internal solid  component Measures 6 mm, image 24 of series 4. Previously 7 mm.  No new pulmonary nodules or masses identified.  Upper Abdomen: No acute abnormality.  Musculoskeletal: No chest wall mass or suspicious bone lesions identified.  IMPRESSION: 1. No acute cardiopulmonary abnormalities. 2. Stable part solid nodule within the left upper lobe which remains suspicious for low-grade adenocarcinoma (atypical adenomatous hyperplasia). 3. Emphysema 4. Aortic atherosclerosis.   Electronically Signed   By: Kerby Moors M.D.   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 Servando Snare, benign on biopsy History of prior tobacco use Recurrent nausea/vomiting and inability to swallow, uncertain etiology Recent diverticulitis Thrush Mild thrombocytopenia  We discussed multiple issues today.  In regards to her lung nodule, she will continue to follow with Dr. Verlene Lewis.  In regards to her head and neck cancer, she is to continue to follow with Candace Lewis and Dr. Benjamine Mola and myself.   I have written her a refill on promethazine.  We discussed her continuing her exercises as prescribed by PT  She will return for a follow up in 4 months.   Orders Placed This Encounter  Procedures  . CBC with Differential    Standing Status:   Future    Standing Expiration Date:   03/18/2017  . Comprehensive metabolic panel    Standing Status:   Future    Standing Expiration Date:   03/18/2017   Meds ordered this encounter  Medications  . promethazine (PHENERGAN) 25 MG tablet    Sig: Take 1 tablet (25 mg total) by mouth every 6 (six) hours as needed for nausea or vomiting.    Dispense:  90 tablet    Refill:  1     All questions were answered. The patient knows to call the clinic with any problems, questions or concerns.  This document serves as a record of services personally  performed by Ancil Linsey, MD. It was created on her behalf by Martinique Casey, a trained medical scribe. The creation of this record is based on the scribe's personal observations and the provider's statements to them. This document has been checked and approved by the attending provider.  I have reviewed the above documentation for accuracy and completeness, and I agree with the above.  This note was electronically signed.  Molli Hazard, MD  03/18/2016 12:19 PM

## 2016-03-19 ENCOUNTER — Other Ambulatory Visit (HOSPITAL_COMMUNITY): Payer: Medicare Other

## 2016-03-19 ENCOUNTER — Ambulatory Visit (HOSPITAL_COMMUNITY): Payer: Medicare Other | Admitting: Hematology & Oncology

## 2016-03-23 ENCOUNTER — Telehealth (HOSPITAL_COMMUNITY): Payer: Self-pay | Admitting: *Deleted

## 2016-03-23 NOTE — Telephone Encounter (Signed)
Unable to reach pt by phone so a letter will be mailed to the pt.

## 2016-04-08 ENCOUNTER — Other Ambulatory Visit (HOSPITAL_COMMUNITY): Payer: Self-pay | Admitting: Hematology & Oncology

## 2016-04-22 ENCOUNTER — Other Ambulatory Visit (HOSPITAL_COMMUNITY): Payer: Self-pay | Admitting: Oncology

## 2016-04-22 ENCOUNTER — Telehealth (HOSPITAL_COMMUNITY): Payer: Self-pay | Admitting: *Deleted

## 2016-04-22 DIAGNOSIS — C029 Malignant neoplasm of tongue, unspecified: Secondary | ICD-10-CM

## 2016-04-22 DIAGNOSIS — E039 Hypothyroidism, unspecified: Secondary | ICD-10-CM

## 2016-04-22 MED ORDER — LEVOTHYROXINE SODIUM 75 MCG PO TABS: 75.0000 ug | ORAL_TABLET | Freq: Every day | ORAL | 2 refills | Status: DC

## 2016-04-22 NOTE — Telephone Encounter (Signed)
New rx escribed to [employer], PA-C 04/22/2016 4:35 PM

## 2016-04-24 ENCOUNTER — Encounter (HOSPITAL_COMMUNITY): Payer: Self-pay | Admitting: Hematology & Oncology

## 2016-05-14 ENCOUNTER — Encounter (HOSPITAL_COMMUNITY): Payer: Medicare Other | Attending: Hematology & Oncology

## 2016-05-14 DIAGNOSIS — C029 Malignant neoplasm of tongue, unspecified: Secondary | ICD-10-CM | POA: Insufficient documentation

## 2016-05-14 LAB — CBC WITH DIFFERENTIAL/PLATELET
Basophils Absolute: 0 10*3/uL (ref 0.0–0.1)
Basophils Relative: 0 %
Eosinophils Absolute: 0.2 10*3/uL (ref 0.0–0.7)
Eosinophils Relative: 3 %
HCT: 39.5 % (ref 36.0–46.0)
Hemoglobin: 13.6 g/dL (ref 12.0–15.0)
Lymphocytes Relative: 16 %
Lymphs Abs: 0.8 10*3/uL (ref 0.7–4.0)
MCH: 29.2 pg (ref 26.0–34.0)
MCHC: 34.4 g/dL (ref 30.0–36.0)
MCV: 84.9 fL (ref 78.0–100.0)
Monocytes Absolute: 0.3 10*3/uL (ref 0.1–1.0)
Monocytes Relative: 6 %
Neutro Abs: 3.7 10*3/uL (ref 1.7–7.7)
Neutrophils Relative %: 75 %
Platelets: 139 10*3/uL — ABNORMAL LOW (ref 150–400)
RBC: 4.65 MIL/uL (ref 3.87–5.11)
RDW: 13.1 % (ref 11.5–15.5)
WBC: 4.9 10*3/uL (ref 4.0–10.5)

## 2016-05-14 LAB — COMPREHENSIVE METABOLIC PANEL
ALT: 18 U/L (ref 14–54)
AST: 22 U/L (ref 15–41)
Albumin: 3.6 g/dL (ref 3.5–5.0)
Alkaline Phosphatase: 70 U/L (ref 38–126)
Anion gap: 4 — ABNORMAL LOW (ref 5–15)
BUN: 24 mg/dL — ABNORMAL HIGH (ref 6–20)
CO2: 29 mmol/L (ref 22–32)
Calcium: 9.3 mg/dL (ref 8.9–10.3)
Chloride: 103 mmol/L (ref 101–111)
Creatinine, Ser: 0.84 mg/dL (ref 0.44–1.00)
GFR calc Af Amer: >60 mL/min (ref >60)
GFR calc non Af Amer: >60 mL/min (ref >60)
Glucose, Bld: 111 mg/dL — ABNORMAL HIGH (ref 65–99)
Potassium: 3.4 mmol/L — ABNORMAL LOW (ref 3.5–5.1)
Sodium: 136 mmol/L (ref 135–145)
Total Bilirubin: 0.4 mg/dL (ref 0.3–1.2)
Total Protein: 6.7 g/dL (ref 6.5–8.1)

## 2016-07-02 ENCOUNTER — Other Ambulatory Visit (HOSPITAL_COMMUNITY): Payer: Self-pay | Admitting: Hematology & Oncology

## 2016-07-02 DIAGNOSIS — R112 Nausea with vomiting, unspecified: Secondary | ICD-10-CM

## 2016-07-14 ENCOUNTER — Other Ambulatory Visit: Payer: Self-pay | Admitting: *Deleted

## 2016-07-14 DIAGNOSIS — R911 Solitary pulmonary nodule: Secondary | ICD-10-CM

## 2016-07-17 ENCOUNTER — Encounter (HOSPITAL_COMMUNITY): Payer: Self-pay | Admitting: Adult Health

## 2016-07-17 ENCOUNTER — Encounter (HOSPITAL_COMMUNITY): Payer: Medicare Other | Attending: Hematology | Admitting: Adult Health

## 2016-07-17 ENCOUNTER — Encounter (HOSPITAL_COMMUNITY): Payer: Medicare Other

## 2016-07-17 VITALS — BP 139/79 | HR 110 | Temp 98.2°F | Resp 18 | Wt 175.2 lb

## 2016-07-17 DIAGNOSIS — R112 Nausea with vomiting, unspecified: Secondary | ICD-10-CM

## 2016-07-17 DIAGNOSIS — R918 Other nonspecific abnormal finding of lung field: Secondary | ICD-10-CM | POA: Diagnosis not present

## 2016-07-17 DIAGNOSIS — C029 Malignant neoplasm of tongue, unspecified: Secondary | ICD-10-CM | POA: Diagnosis not present

## 2016-07-17 DIAGNOSIS — E039 Hypothyroidism, unspecified: Secondary | ICD-10-CM | POA: Insufficient documentation

## 2016-07-17 LAB — COMPREHENSIVE METABOLIC PANEL
ALT: 14 U/L (ref 14–54)
AST: 19 U/L (ref 15–41)
Albumin: 3.8 g/dL (ref 3.5–5.0)
Alkaline Phosphatase: 74 U/L (ref 38–126)
Anion gap: 7 (ref 5–15)
BUN: 24 mg/dL — ABNORMAL HIGH (ref 6–20)
CO2: 29 mmol/L (ref 22–32)
Calcium: 9.5 mg/dL (ref 8.9–10.3)
Chloride: 103 mmol/L (ref 101–111)
Creatinine, Ser: 0.92 mg/dL (ref 0.44–1.00)
GFR calc Af Amer: >60 mL/min (ref >60)
GFR calc non Af Amer: >60 mL/min (ref >60)
Glucose, Bld: 100 mg/dL — ABNORMAL HIGH (ref 65–99)
Potassium: 3.6 mmol/L (ref 3.5–5.1)
Sodium: 139 mmol/L (ref 135–145)
Total Bilirubin: 0.4 mg/dL (ref 0.3–1.2)
Total Protein: 7 g/dL (ref 6.5–8.1)

## 2016-07-17 LAB — CBC WITH DIFFERENTIAL/PLATELET
Basophils Absolute: 0 10*3/uL (ref 0.0–0.1)
Basophils Relative: 1 %
Eosinophils Absolute: 0.1 10*3/uL (ref 0.0–0.7)
Eosinophils Relative: 3 %
HCT: 40.3 % (ref 36.0–46.0)
Hemoglobin: 13.7 g/dL (ref 12.0–15.0)
Lymphocytes Relative: 19 %
Lymphs Abs: 0.8 10*3/uL (ref 0.7–4.0)
MCH: 29 pg (ref 26.0–34.0)
MCHC: 34 g/dL (ref 30.0–36.0)
MCV: 85.2 fL (ref 78.0–100.0)
Monocytes Absolute: 0.2 10*3/uL (ref 0.1–1.0)
Monocytes Relative: 6 %
Neutro Abs: 3 10*3/uL (ref 1.7–7.7)
Neutrophils Relative %: 71 %
Platelets: 169 10*3/uL (ref 150–400)
RBC: 4.73 MIL/uL (ref 3.87–5.11)
RDW: 13.2 % (ref 11.5–15.5)
WBC: 4.1 10*3/uL (ref 4.0–10.5)

## 2016-07-17 LAB — TSH: TSH: 3.949 u[IU]/mL (ref 0.350–4.500)

## 2016-07-17 NOTE — Patient Instructions (Addendum)
[place] at [facility] Discharge Instructions  RECOMMENDATIONS MADE BY THE CONSULTANT AND ANY TEST RESULTS WILL BE SENT TO YOUR REFERRING PHYSICIAN.  You were seen today by Mike Craze, NP Lab work today Follow up in 4 months  See Amy up front for appointments   Thank you for choosing [place] at [facility] to provide your oncology and hematology care.  To afford each patient quality time with our provider, please arrive at least 15 minutes before your scheduled appointment time.    If you have a lab appointment with the [facility] please come in thru the  Main Entrance and check in at the main information desk  You need to re-schedule your appointment should you arrive 10 or more minutes late.  We strive to give you quality time with our providers, and arriving late affects you and other patients whose appointments are after yours.  Also, if you no show three or more times for appointments you may be dismissed from the clinic at the providers discretion.     Again, thank you for choosing [facility].  Our hope is that these requests will decrease the amount of time that you wait before being seen by our physicians.       _____________________________________________________________  Should you have questions after your visit to [facility], please contact our office at [phone] between the hours of 8:30 a.m. and 4:30 p.m.  Voicemails left after 4:30 p.m. will not be returned until the following business day.  For prescription refill requests, have your pharmacy contact our office.       Resources For Cancer Patients and their Caregivers ? American Cancer Society: Can assist with transportation, wigs, general needs, runs Look Good Feel Better.        3403125541 ? Cancer Care: Provides financial assistance, online support groups, medication/co-pay assistance.  1-800-813-HOPE 858-582-5033) ? [place] Assists [place] cancer patients and their families through emotional , educational and financial support.  (979)039-5364 ? [place] DSS Where to apply for food stamps, Medicaid and utility assistance. (318) 535-5438 ? [facility]: Transportation to medical appointments. 720-800-4552 ? Social Security Administration: May apply for disability if have a Stage IV cancer. (248)714-3902 918 800 3806 ? [employer], Disability and Transit Services: Assists with nutrition, care and transit needs. [place] Support Programs: @10RELATIVEDAYS @ > Cancer Support Group  2nd Tuesday of the month 1pm-2pm, Journey Room  > Creative Journey  3rd Tuesday of the month 1130am-1pm, Journey Room  > Look Good Feel Better  1st Wednesday of the month 10am-12 noon, Journey Room (Call [place] to register [phone])

## 2016-07-17 NOTE — Progress Notes (Signed)
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      01/19/2014 PET scan    Tongue mass with 2 level II Right neck nodes T2N2b, Stage IVA      02/08/2014 - 04/03/2014 Radiation Therapy    Initiation of radiation therapy with 70 Gy delivered to the right base of tongue/tonsil 63 Gy to high risk nodal echelons, 56 Gy to the intermediate r base of tongue/tonsil/bilateral neck nodes      02/08/2014 - 03/15/2014 Chemotherapy    weekly cisplatin, only 4 doses given, severe toxicity with neutropenia, dehydration, nausea, vomiting, hospitalization and obstipation      10/24/2015 PET scan    low level non malig range hyper metab in L apical sub solid pulm nodule, suspicious for low grade adeno. diffuse tongue hypermetab without CT correlation      10/28/2015 Procedure    Bronchoscopy navigation, electromagnetic navigation, bronchoscopy with biopsy of LUL lung lesion, placement of fiducial markers X 3      10/28/2015 Pathology Results    Scant benign lung tissue, no tumor seen        INTERVAL HISTORY:  Candace Lewis 76 y.o. female returns for follow-up for history of Stage IVA tongue cancer and LUL lung nodule (biopsied and was benign).   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  She has now gone 16 days without vomiting.  Over the past several months, she has noted longer periods of time between vomiting episodes, generally 16-17 days, where she will then have 1 episode of vomiting followed by another prolonged period of no vomiting.   Her nausea regimen includes Zofran ODT at 7 am, followed by Phenergan at 2 pm & 10 pm.  She has gained weight; ~7 lbs in the past 4 months.  She is drinking Ensure with milk, which she is tolerating well.  Continues to struggle with xerostomia, but it is manageable.    She tells me she is scheduled to see Dr. Servando Snare with cardiothoracic surgery in 08/2016.  Per patient, Dr. Servando Snare is recommending surgery given suspicious appearance of lung lesion.  Candace Lewis remains very concerned about the possibilities of surgery given her dysphagia and chronic N&V.   She is scheduled to see her ENT physician, Dr. Benjamine Mola, next week for continued surveillance.   Overall, Candace Lewis tells me that she feels very well.      REVIEW OF SYSTEMS:  Review of Systems  Constitutional: Negative.  Negative for chills, fatigue and fever.  HENT:  Negative.  Negative for lump/mass and nosebleeds.   Eyes: Negative.   Respiratory: Negative.  Negative for cough and shortness of breath.   Cardiovascular: Negative.  Negative for chest pain and leg swelling.  Gastrointestinal: Positive for nausea and vomiting (much improved from previous). Negative for abdominal pain, blood in stool, constipation and diarrhea.  Endocrine: Negative.   Genitourinary: Negative.  Negative for dysuria and hematuria.   Musculoskeletal: Negative.  Negative for arthralgias.  Skin: Negative.  Negative for rash.  Neurological: Negative.  Negative for dizziness and headaches.  Hematological: Negative.  Negative for adenopathy. Does not bruise/bleed easily.  Psychiatric/Behavioral: Negative.  Negative for depression and sleep disturbance. The patient is not nervous/anxious.      PAST MEDICAL/SURGICAL HISTORY:  Past Medical History:  Diagnosis Date  . Arthritis    lower back  . Constipation   . Diverticulitis   . Ectopic pregnancy 1977  . 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 GERD (gastroesophageal reflux disease)     . Headache    migraines  . Hypertension    no longer taking meds  . Hypokalemia 2015  . Lung nodule, multiple 01/08/2015  . Macular degeneration of right eye   . Malignant neoplasm of tongue ([facility]) 01/24/2014  . Thyroid disease    Past Surgical History:  Procedure Laterality Date  . BIOPSY PHARYNX  2015  . CHOLECYSTECTOMY  2005  . FUDUCIAL PLACEMENT N/A 10/28/2015   Procedure: PLACEMENT OF FUDUCIAL TIMES THREE; LEFT UPPER LOBE BIOPSY;  Surgeon: Grace Isaac, MD;  Location: [facility];  Service: Thoracic;  Laterality: N/A;  . MASTECTOMY, PARTIAL  1984  . SKIN SURGERY  1960   moles removed from various areas per patient report  . TONSILLECTOMY  1950   per patient report  . TUBAL LIGATION  1976  . VAGINAL HYSTERECTOMY  1981   vaginal hyst per patient report  . VIDEO BRONCHOSCOPY WITH ENDOBRONCHIAL NAVIGATION N/A 10/28/2015   Procedure: VIDEO BRONCHOSCOPY WITH ENDOBRONCHIAL NAVIGATION;  Surgeon: Grace Isaac, MD;  Location: [facility];  Service: Thoracic;  Laterality: N/A;     SOCIAL HISTORY:  Social History   Social History  . Marital status: Married    Spouse name: N/A  . Number of children: N/A  . Years of education: N/A   Occupational History  . Not on file.   Social History Main Topics  . Smoking status: Former Smoker    Packs/day: 1.00    Years: 23.00    Types: Cigarettes    Quit date: 05/03/1982  . Smokeless tobacco: Never Used  . Alcohol use No  . Drug use: No  . Sexual activity: Not on file   Other Topics Concern  . Not on file   Social History Narrative  . No narrative on file    FAMILY HISTORY:  History reviewed. No pertinent family history.  CURRENT MEDICATIONS:  Outpatient Encounter Prescriptions as of 07/17/2016  Medication Sig Note  . acetaminophen-codeine (TYLENOL #3) 300-30 MG tablet Take 1 tablet by mouth every 6 (six) hours as needed for moderate pain.   Marland Kitchen docusate sodium (COLACE) 100 MG capsule Take by mouth. 12/18/2015: Received from:  [facility]: Take 200 mg by mouth at bedtime.  Marland Kitchen levothyroxine (SYNTHROID) 75 MCG tablet Take 1 tablet (75 mcg total) by mouth daily before breakfast.   . omeprazole (PRILOSEC) 20 MG capsule TAKE ONE CAPSULE BY MOUTH ONCE DAILY   . ondansetron (ZOFRAN-ODT) 8 MG disintegrating tablet 8 mg every 8 (eight) hours as needed.  03/18/2016: Received from: External Pharmacy  . promethazine (PHENERGAN) 25 MG tablet TAKE ONE TABLET BY MOUTH EVERY 6 HOURS AS NEEDED FOR NAUSEA AND VOMITING    No facility-administered encounter medications on file as of 07/17/2016.     ALLERGIES:  Allergies  Allergen Reactions  . Adhesive [Tape] Rash    Reports rash and blistering if any adhesive tape  . Latex Rash    Rash and skin blistering  per patient report     PHYSICAL EXAM:  ECOG Performance status: 1 - Symptomatic, but independent.   Vitals:   07/17/16 1300  BP: 139/79  Pulse: (!) 110  Resp: 18  Temp: 98.2 F (36.8 C)   Filed Weights   07/17/16 1300  Weight: 175 lb 3.2 oz (79.5 kg)    Physical Exam  Constitutional: She is oriented to person, place, and time and well-developed, well-nourished, and in no distress.  HENT:  Head: Normocephalic.  Mouth/Throat: Oropharynx is clear and moist.  -Dry mouth and tongue  -Mild posterior oropharyngeal erythema, consistent with radiation changes.     Eyes: Conjunctivae are normal. Pupils are equal, round, and reactive to light. No scleral icterus.  Neck: Normal range of motion.  Diffuse anterior neck fibrosis as expected s/p radiation therapy. No palpable adenopathy.   Cardiovascular: Normal rate, regular rhythm and normal heart sounds.   Pulmonary/Chest: Effort normal and breath sounds normal. No respiratory distress.  Abdominal: Soft. Bowel sounds are normal. There is no tenderness.  Musculoskeletal: Normal range of motion. She exhibits no edema.  Lymphadenopathy:    She has no cervical adenopathy.       Right: No supraclavicular  adenopathy present.       Left: No supraclavicular adenopathy present.  Neurological: She is alert and oriented to person, place, and time. No cranial nerve deficit. Gait normal.  Skin: Skin is warm and dry. No rash noted.  Psychiatric: Memory, affect and judgment normal. Her mood appears anxious.  Mood appears anxious. (pt denies any concerns r/t anxiety)   Nursing note and vitals reviewed.    LABORATORY DATA:  I have reviewed the labs as listed.  CBC    Component Value Date/Time   WBC 4.1 07/17/2016 1350   RBC 4.73 07/17/2016 1350   HGB 13.7 07/17/2016 1350   HCT 40.3 07/17/2016 1350   PLT 169 07/17/2016 1350   MCV 85.2 07/17/2016 1350   MCH 29.0 07/17/2016 1350   MCHC 34.0 07/17/2016 1350   RDW 13.2 07/17/2016 1350   LYMPHSABS 0.8 07/17/2016 1350   MONOABS 0.2 07/17/2016 1350   EOSABS 0.1 07/17/2016 1350   BASOSABS 0.0 07/17/2016 1350   CMP Latest Ref Rng & Units 07/17/2016 05/14/2016 03/18/2016  Glucose 65 - 99 mg/dL 100(H) 111(H) 99  BUN 6 - 20 mg/dL 24(H) 24(H) 21(H)  Creatinine 0.44 - 1.00 mg/dL 0.92 0.84 0.82  Sodium 135 - 145 mmol/L 139 136 140  Potassium 3.5 - 5.1 mmol/L 3.6 3.4(L) 3.6  Chloride 101 - 111 mmol/L 103 103 105  CO2 22 - 32 mmol/L 29 29 28   Calcium 8.9 - 10.3 mg/dL 9.5 9.3 9.6  Total Protein 6.5 - 8.1 g/dL 7.0 6.7 6.9  Total Bilirubin 0.3 - 1.2 mg/dL 0.4 0.4 0.5  Alkaline Phos 38 - 126 U/L 74 70 72  AST 15 - 41 U/L 19 22 23   ALT 14 - 54 U/L 14 18 14    Results for Candace Lewis, Candace Lewis (MRN [id number])   Ref. Range 07/17/2016 13:50  TSH Latest Ref Range: 0.350 - 4.500 uIU/mL 3.949    PENDING LABS:    DIAGNOSTIC IMAGING:  CT chest: 02/20/16   PATHOLOGY:  (R) tonsil/tongue base biopsy: 01/02/14 [facility])    LUL lung biopsy: 10/28/15    ASSESSMENT & PLAN:   Stage IVA squamous cell carcinoma of tongue:  -Diagnosed 01/2014; treated with concurrent chemoradiation; completed treatment on 04/03/14.  -Maintain follow-up with Dr. Benjamine Mola  (ENT), as scheduled.  Lung nodule:  -Continue follow-up with Dr. Servando Snare. Candace Lewis is very resistant to having surgery for probable malignant LUL lung lesion.  She would like to consider radiation therapy before surgery if lesion requires treatment.    Chronic N&V, improved:  -Current regimen with scheduled Zofran and Phenergan effective in managing her chronic N&V.  -Phenergan refilled today.  -Prilosec refilled today as well.   Hypothyroidism:  -TSH normal today at 3.949.  -Continue current dose of Synthroid 75 mcg.      Dispo:  -Return to cancer center in 4 months for continued surveillance.    All questions were answered to patient's stated satisfaction. Encouraged patient to call with any new concerns or questions before her next visit to the cancer center and we can certain see her sooner, if needed.      Orders placed this encounter:  Orders Placed This Encounter  Procedures  . CBC with Differential/Platelet  . Comprehensive metabolic panel  . TSH      Mike Craze, NP [place] [phone]

## 2016-07-19 ENCOUNTER — Other Ambulatory Visit (HOSPITAL_COMMUNITY): Payer: Self-pay | Admitting: Hematology & Oncology

## 2016-07-20 ENCOUNTER — Other Ambulatory Visit (HOSPITAL_COMMUNITY): Payer: Self-pay

## 2016-07-20 DIAGNOSIS — E039 Hypothyroidism, unspecified: Secondary | ICD-10-CM

## 2016-07-20 MED ORDER — LEVOTHYROXINE SODIUM 75 MCG PO TABS: 75.0000 ug | ORAL_TABLET | Freq: Every day | ORAL | 1 refills | Status: DC

## 2016-07-20 NOTE — Telephone Encounter (Signed)
Patient called for refill on Synthroid. Reviewed with PA-C, chart checked and refilled.

## 2016-07-23 ENCOUNTER — Ambulatory Visit (INDEPENDENT_AMBULATORY_CARE_PROVIDER_SITE_OTHER): Payer: Medicare Other | Admitting: Otolaryngology

## 2016-07-23 DIAGNOSIS — Z85818 Personal history of malignant neoplasm of other sites of lip, oral cavity, and pharynx: Secondary | ICD-10-CM

## 2016-08-27 ENCOUNTER — Encounter: Payer: Self-pay | Admitting: Cardiothoracic Surgery

## 2016-08-27 ENCOUNTER — Ambulatory Visit (INDEPENDENT_AMBULATORY_CARE_PROVIDER_SITE_OTHER): Payer: Medicare Other | Admitting: Cardiothoracic Surgery

## 2016-08-27 ENCOUNTER — Ambulatory Visit
Admission: RE | Admit: 2016-08-27 | Discharge: 2016-08-27 | Disposition: A | Discharge status: Home / Self Care | Payer: Medicare Other | Source: Ambulatory Visit | Attending: Cardiothoracic Surgery | Admitting: Cardiothoracic Surgery

## 2016-08-27 VITALS — BP 136/82 | HR 104 | Resp 20 | Ht 67.0 in | Wt 177.0 lb

## 2016-08-27 DIAGNOSIS — D381 Neoplasm of uncertain behavior of trachea, bronchus and lung: Secondary | ICD-10-CM | POA: Diagnosis not present

## 2016-08-27 DIAGNOSIS — Z85810 Personal history of malignant neoplasm of tongue: Secondary | ICD-10-CM

## 2016-08-27 DIAGNOSIS — R911 Solitary pulmonary nodule: Secondary | ICD-10-CM

## 2016-08-27 NOTE — Progress Notes (Signed)
[address]       [place] [address]             [phone]                    Candace Lewis Medical Record #[id number] Date of Birth: 1940/10/26  Referring: Everardo All, MD Primary Care: Octavio Graves, DO  Chief Complaint:    Chief Complaint  Patient presents with  . Lung Lesion    6 month f/u with Chest CT- Super/D     History of Present Illness:    Candace Lewis 76 y.o. female had beenFollowed in the office for groundglass opacity  in the lung of the left upper lobe. The patient has a complex history of present previous cancer of the head and neck. 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 Since that time she's continued to have feeding problems frequently vomiting and she says lives off mostly ensure with milk. Recently she's also had a flareup of diverticulitis and is on by mouth antibiotics for this. She'd had a gastrostomy tube in the past but this has been removed.  In June 2017 the patient had navigation bronchoscopy to  a semisolid left apical pulmonary nodule.  The cytology and biopsy results show no evidence of malignancy. The patient's scans and pathology was reviewed at the multidisciplinary thoracic oncology conference . The consensus was to continue to follow radiographically the findings. She returns today with follow up ct of chest   The patient remains very apprehensive about having any invasive procedures none, she notes that the past several years with her chemotherapy and radiation treatment of her head and neck cancer has been very difficult for her. She continues to have significant swallowing problems and is unable to take solid food. She notes that this is been evaluated with barium swallow and  endoscopy.  Current Activity/ Functional Status:  Patient is independent with mobility/ambulation, transfers, ADL's, IADL's.   Zubrod Score: At the time of surgery this patient's most appropriate activity status/level should be described as: []     0    Normal activity, no symptoms [x]     1    Restricted in physical strenuous activity but ambulatory, able to do out light work []     2    Ambulatory and capable of self care, unable to do work activities, up and about               >50 % of waking hours                              []     3    Only limited self care, in bed greater than 50% of waking hours []     4    Completely disabled, no self care, confined to bed or chair []     5    Moribund   Past Medical History   Hypertension    High cholesterol    High triglycerides    Squamous cell cancer of tongue (*)    Colon cancer (*)  cancer tongue  Right ear pain August 2015 since diagnosed with cancer tongue  Hypokalemia    Thrush  of mouth and esophagus (*)  Dr Lisbeth Renshaw examined Radiation visit. Script Diflucan given.  Diverticulitis      Past Surgical History:  Procedure Laterality Date  . BIOPSY PHARYNX  2015  . CHOLECYSTECTOMY  2005  . FUDUCIAL PLACEMENT N/A 10/28/2015   Procedure: PLACEMENT OF FUDUCIAL TIMES THREE; LEFT UPPER LOBE BIOPSY;  Surgeon: Grace Isaac, MD;  Location: [facility];  Service: Thoracic;  Laterality: N/A;  . MASTECTOMY, PARTIAL  1984  . SKIN SURGERY  1960   moles removed from various areas per patient report  . TONSILLECTOMY  1950   per patient report  . TUBAL LIGATION  1976  . VAGINAL HYSTERECTOMY  1981   vaginal hyst per patient report  . VIDEO BRONCHOSCOPY WITH ENDOBRONCHIAL NAVIGATION N/A 10/28/2015   Procedure: VIDEO BRONCHOSCOPY WITH ENDOBRONCHIAL NAVIGATION;  Surgeon: Grace Isaac, MD;  Location: [facility];  Service: Thoracic;  Laterality: N/A;   [place]  mole removal  [place]    GALLBLADDER SURGERY 2005    EMPHYSEMA     PEG TUBE PLACEMENT 01/29/2014    PORTACATH PLACEMENT 01/29/2014       Family History: Patient's mother died of COPD and heart failure, father died of diabetes  Social History   Social History  . Marital Status: Married    Spouse Name: N/A  . Number of Children: N/A  . Years of Education: N/A   Occupational History  . 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   Social History Main Topics  . Smoking status: Former Smoker -- 1.00 packs/day for 23 years    Types: Cigarettes    Quit date: 05/03/1982  . Smokeless tobacco: Never Used  . Alcohol Use: Not on file  . Drug Use: Not on file  . Sexual Activity: Not on file     History  Smoking Status  . Former Smoker  . Packs/day: 1.00  . Years: 23.00  . Types: Cigarettes  . Quit date: 05/03/1982  Smokeless Tobacco  . Never Used    History  Alcohol Use No     Allergies  Allergen Reactions  . Adhesive [Tape] Rash    Reports rash and blistering if any adhesive tape  . Latex Rash    Rash and skin blistering per patient report    Current Outpatient Prescriptions  Medication Sig Dispense Refill  . acetaminophen-codeine (TYLENOL #3) 300-30 MG tablet Take 1 tablet by mouth every 6 (six) hours as needed for moderate pain.    Marland Kitchen docusate sodium (COLACE) 100 MG capsule Take by mouth.    . levothyroxine (SYNTHROID) 75 MCG tablet Take 1 tablet (75 mcg total) by mouth daily before breakfast. 90 tablet 1  . omeprazole (PRILOSEC) 20 MG capsule TAKE ONE CAPSULE BY MOUTH ONCE DAILY 30 capsule 3  . ondansetron (ZOFRAN-ODT) 8 MG disintegrating tablet 8 mg every 8 (eight) hours as needed.     . promethazine (PHENERGAN) 25 MG tablet TAKE ONE TABLET BY MOUTH EVERY 6 HOURS AS NEEDED FOR NAUSEA AND VOMITING 90  tablet 1   No current facility-administered medications for this visit.      Review of Systems:     Cardiac Review of Systems: Y or N  Chest Pain [  n  ]  Resting SOB [n   ] Exertional SOB  [ n ]  Orthopnea [n ]   Pedal Edema [ n  ]    Palpitations [ n ] Syncope  [ n ]   Presyncope [ n ]  General Review of Systems: [Y] = yes [  ]=no Constitional: recent weight change [  ];  Wt loss over the last 3 months [ n  ] anorexia [  ]; fatigue [  ]; nausea [  ]; night sweats [  ]; fever [  ]; or chills [  ];          Dental: poor dentition[ dentures ]; Last Dentist visit:   Eye : blurred vision [  ]; diplopia [   ]; vision changes [  ];  Amaurosis fugax[  ]; Resp: cough [  ];  wheezing[ y];  hemoptysis[  ]; shortness of breath[  ]; paroxysmal nocturnal dyspnea[  ]; dyspnea on exertion[  ]; or orthopnea[  ];  GI:  gallstones[  ], vomiting[ y ];  dysphagia[y  ]; melena[ n ];  hematochezia [n  ]; heartburn[  ];   Hx of  Colonoscopy[  ]; GU: kidney stones [  ]; hematuria[  ];   dysuria [  ];  nocturia[  ];  history of     obstruction [  ]; urinary frequency [  ]             Skin: rash, swelling[  ];, hair loss[  ];  peripheral edema[  ];  or itching[  ]; Musculosketetal: myalgias[  ];  joint swelling[  ];  joint erythema[  ];  joint pain[  ];  back pain[  ];  Heme/Lymph: bruising[  ];  bleeding[  ];  anemia[  ];  Neuro: TIA[  ];  headaches[  ];  stroke[  ];  vertigo[  ];  seizures[  ];   paresthesias[  ];  difficulty walking[  ];  Psych:depression[  ]; anxiety[  ];  Endocrine: diabetes[n  ];  thyroid dysfunction[  ];  Immunizations: Flu up to date [ y ]; Pneumococcal up to date [ n ];  Other:  Physical Exam: BP 136/82   Pulse (!) 104   Resp 20   Ht 5\' 7"  (1.702 m)   Wt 177 lb (80.3 kg)   SpO2 97%   BMI 27.72 kg/m   PHYSICAL EXAMINATION: General appearance: alert, cooperative and no distress Head: Normocephalic, without obvious abnormality, atraumatic Neck: no adenopathy, no carotid bruit,  no JVD, supple, symmetrical, trachea midline, thyroid not enlarged, symmetric, no tenderness/mass/nodules and There is thickening of the skin of the neck but node definite palpable cervical adenopathy or cervical mass Lymph nodes: Cervical, supraclavicular, and axillary nodes normal. Resp: clear to auscultation bilaterally Back: symmetric, no curvature. ROM normal. No CVA tenderness. Cardio: regular rate and rhythm, S1, S2 normal, no murmur, click, rub or gallop GI: soft, non-tender; bowel sounds normal; no masses,  no organomegaly Extremities: extremities normal, atraumatic, no cyanosis or edema and Homans sign is negative, no sign of DVT Neurologic: Grossly normal  Diagnostic Studies & Laboratory data:     Recent Radiology Findings:  Ct Chest Wo Contrast  Result Date: 08/27/2016 CLINICAL DATA:  LEFT lung nodule. Head neck cancer with radiation and chemotherapy EXAM: CT CHEST WITHOUT CONTRAST TECHNIQUE: Multidetector CT imaging of the chest was performed following the standard protocol without IV contrast. COMPARISON:  CT 02/20/2016 FINDINGS: Cardiovascular: Coronary artery calcification and aortic atherosclerotic calcification. Mediastinum/Nodes:  No axillary supraclavicular adenopathy. No mediastinal hilar adenopathy. No pericardial. Esophagus normal Lungs/Pleura: Ground-glass nodule in the superior aspect of the LEFT upper lobe has ill-defined borders and measures approximately 22 x 24 mm compared with 22 by 24 mm on prior (02/20/2016) for. Nodular peribronchial component measuring 4 mm (image 23, series 4) within lesion compares 6 mm on prior. No addition additional pulmonary nodules within the lungs. Airways normal. Upper Abdomen: Limited view of the liver, kidneys, pancreas are unremarkable. Normal adrenal glands. Musculoskeletal: No aggressive osseous lesion. IMPRESSION: No change in LEFT upper lobe ground-glass nodule with central solid nodule. Lesion remains concerning for atypical adenomatous  hyperplasia. Recommend continued CT surveillance. Electronically Signed   By: Suzy Bouchard M.D.   On: 08/27/2016 12:32   Ct Chest Wo Contrast  Result Date: 02/20/2016 CLINICAL DATA:  Followup lung mass EXAM: CT CHEST WITHOUT CONTRAST TECHNIQUE: Multidetector CT imaging of the chest was performed following the standard protocol without IV contrast. COMPARISON:  10/24/2015 FINDINGS: Cardiovascular: Aortic atherosclerosis. Normal heart size. No pericardial effusion. Mediastinum/Nodes: The trachea appears patent and is midline. Normal appearance of the esophagus. No enlarged mediastinal or hilar lymph nodes. Lungs/Pleura: No pleural effusion. Mild changes of centrilobular emphysema identified. The sub solid lesion within the left upper lobe Measures 2.2 cm, image 28 of series 4. This is unchanged from previous exam. The internal solid component Measures 6 mm, image 24 of series 4. Previously 7 mm. No new pulmonary nodules or masses identified. Upper Abdomen: No acute abnormality. Musculoskeletal: No chest wall mass or suspicious bone lesions identified. IMPRESSION: 1. No acute cardiopulmonary abnormalities. 2. Stable part solid nodule within the left upper lobe which remains suspicious for low-grade adenocarcinoma (atypical adenomatous hyperplasia). 3. Emphysema 4. Aortic atherosclerosis. Electronically Signed   By: Kerby Moors M.D.   On: 02/20/2016 11:45   Nm Pet Image Initial (pi) Skull Base To Thigh  10/24/2015  CLINICAL DATA:  Subsequent treatment strategy for lung mass. History of tongue cancer. EXAM: NUCLEAR MEDICINE PET SKULL BASE TO THIGH TECHNIQUE: 8.4 mCi F-18 FDG was injected intravenously. Full-ring PET imaging was performed from the skull base to thigh after the radiotracer. CT data was obtained and used for attenuation correction and anatomic localization. FASTING BLOOD GLUCOSE:  Value: 99 mg/dl COMPARISON:  Chest CT 10/07/2015. Abdominal pelvic CT of 09/18/2015. Most recent PET of  07/03/2014. FINDINGS: NECK Extensive muscular activity throughout the neck. Given this factor, no cervical hypermetabolic nodes identified. Relatively diffuse tongue hypermetabolism is without CT correlate and most likely due to motion after radiopharmaceutical injection. Similarly, hypermetabolism about the posterior aspect of the larynx is symmetric and likely physiologic. CHEST Low-level hypermetabolism which corresponds to the sub solid left apical pulmonary nodule. This measures 2.8 cm and a S.U.V. max of 1.7 on image 14/series 8. compare a S.U.V. max of 2.2 on the 07/03/2014 exam. No thoracic nodal hypermetabolism. ABDOMEN/PELVIS No areas of abnormal hypermetabolism. SKELETON Focus of hypermetabolism about the anterior right acetabulum is favored to be related to tendinous strain at the origin of thigh extensor musculature. This measures a S.U.V. max of 5.8. Similarly, there is hypermetabolism about the anterior left femoral head/ neck junction which is favored to be due to adjacent muscular strain. No well-defined osseous lesion in either site. CT IMAGES PERFORMED FOR ATTENUATION CORRECTION No cervical adenopathy. Bilateral carotid atherosclerosis. Thoracic aortic and branch vessel atherosclerosis. LAD coronary artery atherosclerosis. Chest findings deferred to recent diagnostic CT. Lower pole right renal fluid density lesion is likely a cyst. Abdominal  aortic atherosclerosis. Cholecystectomy. Hysterectomy. IMPRESSION: 1. Low-level, non malignant range hypermetabolism corresponding to the left apical sub solid pulmonary nodule. This remains suspicious for low-grade adenocarcinoma, given morphology and interval enlargement on prior diagnostic CT. 2. Diffuse tongue hypermetabolism without CT correlate. Recommend physical exam correlation to confirm physiologic or related to motion after pharmaceutical injection. 3. No evidence of hypermetabolic nodes to suggest metastatic disease. 4.  Coronary artery  atherosclerosis. Aortic atherosclerosis. Electronically Signed   By: Abigail Miyamoto M.D.   On: 10/24/2015 10:09      CLINICAL DATA: Followup of pulmonary nodule. Throat cancer in 2015 with chemotherapy and radiation therapy.  EXAM: CT CHEST WITHOUT CONTRAST  TECHNIQUE: Multidetector CT imaging of the chest was performed following the standard protocol without IV contrast.  COMPARISON: Chest radiograph 09/18/2015. Abdominal pelvic CT 09/18/2015. Chest CT 04/05/2015.  FINDINGS: Mediastinum/Nodes: Aortic and branch vessel atherosclerosis. Normal heart size, without pericardial effusion. LAD coronary artery atherosclerosis. No mediastinal or definite hilar adenopathy, given limitations of unenhanced CT.  Lungs/Pleura: No pleural fluid. Biapical pleural-parenchymal scarring. A 3 mm right upper lobe pulmonary nodule on image 29/series 4 is felt to be similar to on the prior exam. There are smaller right apical pulmonary nodules, including at 3 mm on image 30/ series 4, which are likely more apparent today secondary to differences in slice thickness.  A 3 mm right lower lobe subpleural nodule on image 69/series 4 similar.  Sub solid left apical pulmonary nodule measures 2.9 x 2.9 cm on image 30/series 4. Compare 2.0 x 1.5 cm on the most recent exam. The solid components measure on the order of 8 mm on image 27/series 4 and are also felt to be increased.  Upper abdomen: Cholecystectomy. Normal imaged portions of the liver, spleen, stomach, pancreas, adrenal glands, kidneys.  Musculoskeletal: Nodularity in the lateral left breast measures 1.3 cm on image 70/series 2 and is unchanged. No acute osseous abnormality.  IMPRESSION: 1. Increase in size of a sub solid left apical pulmonary nodule. This remains suspicious for a low-grade adenocarcinoma, which had regressed on the prior exam secondary to chemotherapy. 2. Other pulmonary nodules are primarily similar. Some right  apical nodules are more conspicuous today, favored to be due to differences in slice thickness. 3. Atherosclerosis, including within the coronary arteries. 4. Similar left breast nodule which could be re-evaluated at followup or more entirely characterized with mammogram/ultrasound. 5. No thoracic adenopathy to suggest nodal metastasis.   Electronically Signed By: Abigail Miyamoto M.D. On: 10/07/2015 14:02  Final Report  CLINICAL DATA: 76 year old female with history of multiple pulmonary nodules. Followup study. Additional history of tongue cancer.  EXAM: CT CHEST WITH CONTRAST  TECHNIQUE: Multidetector CT imaging of the chest was performed during intravenous contrast administration.  CONTRAST: 60 mL of Isovue 370.  COMPARISON: Chest CT 01/04/2015. PET-CT 07/03/2014.  FINDINGS: Mediastinum/Lymph Nodes: Heart size is normal. There is no significant pericardial fluid, thickening or pericardial calcification. There is atherosclerosis of the thoracic aorta, the great vessels of the mediastinum and the coronary arteries, including calcified atherosclerotic plaque in the left anterior descending coronary artery. No pathologically enlarged mediastinal or hilar lymph nodes. Esophagus is unremarkable in appearance. No axillary lymphadenopathy. Right internal jugular single-lumen porta cath with tip terminating in the distal superior vena cava.  Lungs/Pleura: Again noted is an ill-defined nodular area in the apex of the left upper lobe, which is predominantly ground-glass attenuation (image 15 of series 4) measuring 2.0 x 1.5 cm (slightly smaller than prior study 01/04/2015 at which  point this measured 2.1 x 2.2 cm), with a tiny central solid component measuring only 4 mm (image 15 of series 2), which is also slightly less apparent than prior studies. No other new suspicious appearing pulmonary nodules or masses are identified. The no acute consolidative airspace disease. No pleural  effusions.  Upper Abdomen: Status post cholecystectomy.  Musculoskeletal/Soft Tissues: There are no aggressive appearing lytic or blastic lesions noted in the visualized portions of the skeleton.  IMPRESSION: 1. Previously described mixed solid and sub solid nodule in the apex of the left upper lobe is less apparent than prior examinations. Although the regression is reassuring, based on behavior on prior examinations, this remains concerning for potential low-grade adenocarcinoma, and the regression may simply reflect response to chemotherapy for the patient's head neck cancer. Continued attention on future followup examinations is recommended. 2. No other suspicious appearing pulmonary nodules or masses in the lungs at this time to suggest metastatic disease. 3. Atherosclerosis, including left anterior descending coronary artery disease. Please note that although the presence of coronary artery calcium documents the presence of coronary artery disease, the severity of this disease and any potential stenosis cannot be assessed on this non-gated CT examination. Assessment for potential risk factor modification, dietary therapy or pharmacologic therapy may be warranted, if clinically indicated. 4. Additional incidental findings, as above.   Electronically Signed By: Vinnie Langton M.D. On: 04/05/2015 13:17    Recent Lab Findings: Lab Results  Component Value Date   WBC 4.1 07/17/2016   HGB 13.7 07/17/2016   HCT 40.3 07/17/2016   PLT 169 07/17/2016   GLUCOSE 100 (H) 07/17/2016   ALT 14 07/17/2016   AST 19 07/17/2016   NA 139 07/17/2016   K 3.6 07/17/2016   CL 103 07/17/2016   CREATININE 0.92 07/17/2016   BUN 24 (H) 07/17/2016   CO2 29 07/17/2016   TSH 3.949 07/17/2016   INR 1.00 10/28/2015   PFT's :  FEV1 2.10 85% DLCO 16.75 59%  1. spirometry shows minimal airflow obstruction without bronchodilator improvement. 2.lung volumes are normal. 3.airway resistance  is normal. 4. DLCO moderately reduced   Assessment / Plan:   1/ History of squamous cell carcinoma of the tongue, treated with radiation and chemotherapy 2015 2/Stable groundglass left apical pulmonary nodule suspicious for a low-grade adenocarcinoma,  biopsy by navigation bronchoscopy June 2017 was negative for malignancy. Patient returns today with a follow-up CT scan that shows the area stable. I discussed with the patient that this area is still suspicious for a slow growing malignancy. She is very concerned about any operative intervention primarily because of her difficulty swallowing. She is agreeable to a follow-up CT scan in 6 months to confirm stability.   3/ Atherosclerosis, including left anterior descending coronary     artery disease.- by ct    We'll plan to see the patient back in 6 months with a follow-up CT scan of the chest.  Grace Isaac MD      [address] [place] [address] Office [phone]   Beeper [phone]  08/27/2016 1:01 PM

## 2016-09-09 ENCOUNTER — Other Ambulatory Visit (HOSPITAL_COMMUNITY): Payer: Self-pay | Admitting: *Deleted

## 2016-09-09 DIAGNOSIS — Z1231 Encounter for screening mammogram for malignant neoplasm of breast: Secondary | ICD-10-CM

## 2016-10-26 ENCOUNTER — Ambulatory Visit (HOSPITAL_COMMUNITY)
Admission: RE | Admit: 2016-10-26 | Discharge: 2016-10-26 | Disposition: A | Discharge status: Home / Self Care | Payer: Medicare Other | Source: Ambulatory Visit | Attending: *Deleted | Admitting: *Deleted

## 2016-10-26 ENCOUNTER — Encounter (HOSPITAL_COMMUNITY): Payer: Self-pay

## 2016-10-26 DIAGNOSIS — Z1231 Encounter for screening mammogram for malignant neoplasm of breast: Secondary | ICD-10-CM

## 2016-11-16 ENCOUNTER — Encounter (HOSPITAL_COMMUNITY): Payer: Self-pay | Admitting: Oncology

## 2016-11-16 ENCOUNTER — Other Ambulatory Visit (HOSPITAL_COMMUNITY): Payer: Self-pay | Admitting: Emergency Medicine

## 2016-11-16 ENCOUNTER — Encounter (HOSPITAL_COMMUNITY): Payer: Medicare Other | Attending: Oncology | Admitting: Oncology

## 2016-11-16 ENCOUNTER — Encounter (HOSPITAL_COMMUNITY): Payer: Medicare Other

## 2016-11-16 ENCOUNTER — Telehealth (HOSPITAL_COMMUNITY): Payer: Self-pay | Admitting: *Deleted

## 2016-11-16 VITALS — BP 120/55 | HR 104 | Resp 16 | Ht 67.5 in | Wt 178.0 lb

## 2016-11-16 DIAGNOSIS — R918 Other nonspecific abnormal finding of lung field: Secondary | ICD-10-CM

## 2016-11-16 DIAGNOSIS — C029 Malignant neoplasm of tongue, unspecified: Secondary | ICD-10-CM

## 2016-11-16 LAB — CBC WITH DIFFERENTIAL/PLATELET
Basophils Absolute: 0 10*3/uL (ref 0.0–0.1)
Basophils Relative: 1 %
Eosinophils Absolute: 0.1 10*3/uL (ref 0.0–0.7)
Eosinophils Relative: 3 %
HCT: 39.8 % (ref 36.0–46.0)
Hemoglobin: 13.6 g/dL (ref 12.0–15.0)
Lymphocytes Relative: 20 %
Lymphs Abs: 0.8 10*3/uL (ref 0.7–4.0)
MCH: 28.8 pg (ref 26.0–34.0)
MCHC: 34.2 g/dL (ref 30.0–36.0)
MCV: 84.3 fL (ref 78.0–100.0)
Monocytes Absolute: 0.2 10*3/uL (ref 0.1–1.0)
Monocytes Relative: 5 %
Neutro Abs: 2.6 10*3/uL (ref 1.7–7.7)
Neutrophils Relative %: 71 %
Platelets: 146 10*3/uL — ABNORMAL LOW (ref 150–400)
RBC: 4.72 MIL/uL (ref 3.87–5.11)
RDW: 13.3 % (ref 11.5–15.5)
WBC: 3.7 10*3/uL — ABNORMAL LOW (ref 4.0–10.5)

## 2016-11-16 LAB — BASIC METABOLIC PANEL
Anion gap: 8 (ref 5–15)
BUN: 24 mg/dL — ABNORMAL HIGH (ref 6–20)
CO2: 28 mmol/L (ref 22–32)
Calcium: 9.7 mg/dL (ref 8.9–10.3)
Chloride: 102 mmol/L (ref 101–111)
Creatinine, Ser: 0.98 mg/dL (ref 0.44–1.00)
GFR calc Af Amer: >60 mL/min (ref >60)
GFR calc non Af Amer: 55 mL/min — ABNORMAL LOW (ref >60)
Glucose, Bld: 115 mg/dL — ABNORMAL HIGH (ref 65–99)
Potassium: 3.9 mmol/L (ref 3.5–5.1)
Sodium: 138 mmol/L (ref 135–145)

## 2016-11-16 LAB — TSH: TSH: 1.766 u[IU]/mL (ref 0.350–4.500)

## 2016-11-16 NOTE — Assessment & Plan Note (Signed)
Enlarging left upper lobe pulmonary nodule, status post biopsy by Dr. Servando Snare on 10/28/2015 that was negative for malignancy.  Most recent imaging continues to demonstrate slowly enlarging pulmonary nodule still concerning for a slow-growing bronchogenic carcinoma.  Patient is to continue to follow-up with Dr. Servando Snare who has ordered repeat imaging.

## 2016-11-16 NOTE — Assessment & Plan Note (Addendum)
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  Labs today: CBC diff, BMET, TSH.  I personally reviewed and went over laboratory results with the patient.  The results are noted within this dictation.  Labs in 4 months: CBC diff, CMET, TSH.  Continued surveillance by ENT.  She saw Dr. Benjamine Mola on 07/23/2016.  Laryngoscopy showed mild diffuse edema of pharynx and larynx.  She will have follow-up with Dr. Benjamine Mola in Sept 2018.  She has ongoing issues with dysphagia with trouble with foods, pills, and liquids.  Will refer to GI for evaluation/management.  She has a right sternocleidomastoid muscle that is tight and tender to palpation.  I have recommended Ice/Heat.    Return in 4 months for follow-up.

## 2016-11-16 NOTE — Progress Notes (Signed)
Candace Graves, DO [address] [place] [address]  Malignant neoplasm of tongue ([facility]) - Plan: CBC with Differential, Basic metabolic panel, TSH, CBC with Differential, Comprehensive metabolic panel, TSH  Lung nodule, multiple  CURRENT THERAPY: Surveillance  INTERVAL HISTORY: Candace Lewis 76 y.o. 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  S/P biopsy in June 2017 was negative for malignancy.    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      01/19/2014 PET scan    Tongue mass with 2 level II Right neck nodes T2N2b, Stage IVA      02/08/2014 - 04/03/2014 Radiation Therapy    Initiation of radiation therapy with 70 Gy delivered to the right base of tongue/tonsil 63 Gy to high risk nodal echelons, 56 Gy to the intermediate r base of tongue/tonsil/bilateral neck nodes      02/08/2014 - 03/15/2014 Chemotherapy    weekly cisplatin, only 4 doses given, severe toxicity with neutropenia, dehydration, nausea, vomiting, hospitalization and obstipation      10/24/2015 PET scan    low level non malig range hyper metab in L apical sub solid pulm nodule, suspicious for low grade adeno. diffuse tongue hypermetab without CT correlation      10/28/2015 Procedure    Bronchoscopy navigation, electromagnetic navigation, bronchoscopy with biopsy of LUL lung lesion, placement of fiducial markers X 3      10/28/2015 Pathology Results    Scant benign lung tissue, no tumor seen       HPI Elements   Location: Tongue  Quality: Squamous cell carcinoma  Severity: Stage IVA  Duration: Dx in 2015  Context:   Timing:   Modifying Factors:   Associated Signs & Symptoms:    She reports issues with dysphasia of foods, pills, and intermittently liquids.  She is to see GI,  Dr. Britta Mccreedy, but he is no longer local.  GI locally for further evaluation/management.  She notes her nausea/vomiting is well controlled on current antiemetic regimen.  She admits to self-inflicted nausea/vomiting due to times of increased nervousness/anxiety.  She did see Dr. Benjamine Mola in follow-up in March 2018.  Laryngoscopy was negative for any recurrence of disease.  She has follow-up with him in August.  She continues to follow with Dr. Servando Snare for her left upper lobe pulmonary nodule concerning for slow-growing bronchogenic carcinoma.  This was biopsied in 2017 and found to be benign but ongoing growth is concerning for malignancy.  She has follow-up with him in September with repeat imaging.  She notes a right neck discomfort that decreases her range of motion of her neck/head particularly with movements the right.,  Review of Systems  Constitutional: Negative.  Negative for chills, fever and weight loss.  HENT: Negative.   Eyes: Negative.   Respiratory: Negative.  Negative for cough.   Cardiovascular: Negative.  Negative for chest pain.  Gastrointestinal: Positive for nausea and vomiting. Negative for blood in stool, constipation, diarrhea and melena.  Genitourinary: Negative.   Musculoskeletal: Positive for neck pain.  Skin: Negative.   Neurological: Negative.  Negative for weakness.  Endo/Heme/Allergies: Negative.   Psychiatric/Behavioral: The patient is nervous/anxious.     Past Medical History:  Diagnosis Date  . Arthritis    lower back  . Constipation   . Diverticulitis   .  Ectopic pregnancy 1977  . Esophageal cancer ([facility]) 2015   tongue cancer, throat cancer  . GERD (gastroesophageal reflux disease)   . Headache    migraines  . Hypertension    no longer taking meds  . Hypokalemia 2015  . Lung nodule, multiple 01/08/2015  . Macular degeneration of right eye   . Malignant neoplasm of tongue ([facility]) 01/24/2014  . Thyroid disease     Past Surgical History:  Procedure  Laterality Date  . BIOPSY PHARYNX  2015  . BREAST BIOPSY Bilateral    benign  . CHOLECYSTECTOMY  2005  . FUDUCIAL PLACEMENT N/A 10/28/2015   Procedure: PLACEMENT OF FUDUCIAL TIMES THREE; LEFT UPPER LOBE BIOPSY;  Surgeon: Grace Isaac, MD;  Location: [facility];  Service: Thoracic;  Laterality: N/A;  . MASTECTOMY, PARTIAL  1984  . SKIN SURGERY  1960   moles removed from various areas per patient report  . TONSILLECTOMY  1950   per patient report  . TUBAL LIGATION  1976  . VAGINAL HYSTERECTOMY  1981   vaginal hyst per patient report  . VIDEO BRONCHOSCOPY WITH ENDOBRONCHIAL NAVIGATION N/A 10/28/2015   Procedure: VIDEO BRONCHOSCOPY WITH ENDOBRONCHIAL NAVIGATION;  Surgeon: Grace Isaac, MD;  Location: [facility];  Service: Thoracic;  Laterality: N/A;    History reviewed. No pertinent family history.  Social History   Social History  . Marital status: Married    Spouse name: N/A  . Number of children: N/A  . Years of education: N/A   Social History Main Topics  . Smoking status: Former Smoker    Packs/day: 1.00    Years: 23.00    Types: Cigarettes    Quit date: 05/03/1982  . Smokeless tobacco: Never Used  . Alcohol use No  . Drug use: No  . Sexual activity: Not Asked   Other Topics Concern  . None   Social History Narrative  . None     PHYSICAL EXAMINATION  ECOG PERFORMANCE STATUS: 0 - Asymptomatic  Vitals:   11/16/16 1415  BP: (!) 120/55  Pulse: (!) 104  Resp: 16    GENERAL:alert, no distress, well nourished, well developed, comfortable, cooperative, smiling and unaccompanied, nervous. SKIN: skin color, texture, turgor are normal, no rashes or significant lesions HEAD: Normocephalic, No masses, lesions, tenderness or abnormalities EYES: normal, EOMI, Conjunctiva are pink and non-injected EARS: External ears normal OROPHARYNX:lips, buccal mucosa, and tongue normal and mucous membranes are moist  NECK: supple, no adenopathy, trachea midline, right  sternocleidomastoid muscle tightness/tenderness to palpation. LYMPH:  no palpable lymphadenopathy. BREAST:not examined LUNGS: clear to auscultation  HEART: regular rate & rhythm, no murmurs and no gallops ABDOMEN:abdomen soft, non-tender and normal bowel sounds BACK: Back symmetric, no curvature. EXTREMITIES:less then 2 second capillary refill, no joint deformities, effusion, or inflammation, no skin discoloration, no cyanosis  NEURO: alert & oriented x 3 with fluent speech, no focal motor/sensory deficits, gait normal   LABORATORY DATA: CBC    Component Value Date/Time   WBC 4.1 07/17/2016 1350   RBC 4.73 07/17/2016 1350   HGB 13.7 07/17/2016 1350   HCT 40.3 07/17/2016 1350   PLT 169 07/17/2016 1350   MCV 85.2 07/17/2016 1350   MCH 29.0 07/17/2016 1350   MCHC 34.0 07/17/2016 1350   RDW 13.2 07/17/2016 1350   LYMPHSABS 0.8 07/17/2016 1350   MONOABS 0.2 07/17/2016 1350   EOSABS 0.1 07/17/2016 1350   BASOSABS 0.0 07/17/2016 1350      Chemistry      Component  Value Date/Time   NA 139 07/17/2016 1350   K 3.6 07/17/2016 1350   CL 103 07/17/2016 1350   CO2 29 07/17/2016 1350   BUN 24 (H) 07/17/2016 1350   CREATININE 0.92 07/17/2016 1350      Component Value Date/Time   CALCIUM 9.5 07/17/2016 1350   ALKPHOS 74 07/17/2016 1350   AST 19 07/17/2016 1350   ALT 14 07/17/2016 1350   BILITOT 0.4 07/17/2016 1350        PENDING LABS:   RADIOGRAPHIC STUDIES:  Mm Screening Breast Tomo Bilateral  Result Date: 10/26/2016 CLINICAL DATA:  Screening. EXAM: 2D DIGITAL SCREENING BILATERAL MAMMOGRAM WITH CAD AND ADJUNCT TOMO COMPARISON:  Previous exam(s). ACR Breast Density Category c: The breast tissue is heterogeneously dense, which may obscure small masses. FINDINGS: There are no findings suspicious for malignancy. Images were processed with CAD. IMPRESSION: No mammographic evidence of malignancy. A result letter of this screening mammogram will be mailed directly to the patient.  RECOMMENDATION: Screening mammogram in one year. (Code:SM-B-01Y) BI-RADS CATEGORY  1: Negative. Electronically Signed   By: Ammie Ferrier M.D.   On: 10/26/2016 14:29     PATHOLOGY:    ASSESSMENT AND PLAN:  Malignant neoplasm of tongue ([facility]) Stage IVA (T2N2bM0) squamous cell carcinoma of tongue, S/P concurrent chemoXRT.  Labs today: CBC diff, BMET, TSH.  I personally reviewed and went over laboratory results with the patient.  The results are noted within this dictation.  Labs in 4 months: CBC diff, CMET, TSH.  Continued surveillance by ENT.  She saw Dr. Benjamine Mola on 07/23/2016.  Laryngoscopy showed mild diffuse edema of pharynx and larynx.  She will have follow-up with Dr. Benjamine Mola in Sept 2018.  She has ongoing issues with dysphagia with trouble with foods, pills, and liquids.  Will refer to GI for evaluation/management.  She has a right sternocleidomastoid muscle that is tight and tender to palpation.  I have recommended Ice/Heat.    Return in 4 months for follow-up.    Lung nodule, multiple Enlarging left upper lobe pulmonary nodule, status post biopsy by Dr. Servando Snare on 10/28/2015 that was negative for malignancy.  Most recent imaging continues to demonstrate slowly enlarging pulmonary nodule still concerning for a slow-growing bronchogenic carcinoma.  Patient is to continue to follow-up with Dr. Servando Snare who has ordered repeat imaging.   ORDERS PLACED FOR THIS ENCOUNTER: Orders Placed This Encounter  Procedures  . CBC with Differential  . Basic metabolic panel  . TSH  . CBC with Differential  . Comprehensive metabolic panel  . TSH    MEDICATIONS PRESCRIBED THIS ENCOUNTER: No orders of the defined types were placed in this encounter.   THERAPY PLAN:  NCCN guidelines for surveillance of Head and Neck cancer recommends (2.2017):  A. H+P every 1-3 months for year 1  B. H+P every 2-6 months for year 2  C. H+P every 4-8 months for years 3-5  D. H+P every year for years  greater than 5  E. Post-treatment baseline imaging of primary (and neck, if treated) recommended within 6 months of treatment (category 2B).   F. Chest imaging as clinically indicated for patients with smoking history.  G. Further re-imaging as indicated based on worrisome or equivocal signs/symptoms, smoking history, and areas inaccessible to clinical examination.  H. Routine annual imaging may be indicated in areas difficult to visualize on exam.  I. TSH every 6-12 months if neck irradiated.  J. Dental evaluation   1. Recommended for oral cavity and sites exposed  to significant intraoral radiation treatment.  K. Consider EBV DNA monitoring for nasopharyngeal cancer (Category 2B).  L. Supportive care and rehabilitation   1. Speech/hearing and swallowing evaluation and rehabilitation as clinically indicated.   2. Nutritional evaluation and rehabilitation as clinically indicated until nutritional status is stabilized.   3. Ongoing surveillance for depression   4. Smoking cessation and alcohol counseling as clinically indicated.  M. For response assessment immediately after chemoradiation or RT (see FOLL-A 2 of 2).  N. Integration of survivorship care and care plan within 1 year, complementary to ongoing involvement from a head and neck oncologist.   All questions were answered. The patient knows to call the clinic with any problems, questions or concerns. We can certainly see the patient much sooner if necessary.  Patient and plan discussed with Dr. Twana First and she is in agreement with the aforementioned.   This note is electronically signed by: Doy Mince 11/16/2016 2:25 PM

## 2016-11-16 NOTE — Patient Instructions (Signed)
[facility] at [facility] Discharge Instructions  RECOMMENDATIONS MADE BY THE CONSULTANT AND ANY TEST RESULTS WILL BE SENT TO YOUR REFERRING PHYSICIAN.  You were seen today by Kirby Crigler PA-C. Candace Lewis is referring you to A Gastroenterologist. Labs today, we will call you with those results. Follow up with Dr. Benjamine Mola and Dr. Servando Snare as directed. Return in 4 months for labs and follow up.    Thank you for choosing [place] at [facility] to provide your oncology and hematology care.  To afford each patient quality time with our provider, please arrive at least 15 minutes before your scheduled appointment time.    If you have a lab appointment with the [facility] please come in thru the  Main Entrance and check in at the main information desk  You need to re-schedule your appointment should you arrive 10 or more minutes late.  We strive to give you quality time with our providers, and arriving late affects you and other patients whose appointments are after yours.  Also, if you no show three or more times for appointments you may be dismissed from the clinic at the providers discretion.     Again, thank you for choosing [facility].  Our hope is that these requests will decrease the amount of time that you wait before being seen by our physicians.       _____________________________________________________________  Should you have questions after your visit to [facility], please contact our office at [phone] between the hours of 8:30 a.m. and 4:30 p.m.  Voicemails left after 4:30 p.m. will not be returned until the following business day.  For prescription refill requests, have your pharmacy contact our office.       Resources For Cancer Patients and their Caregivers ? American Cancer Society: Can assist with transportation, wigs, general needs, runs Look Good Feel Better.        949-316-1535 ? Cancer  Care: Provides financial assistance, online support groups, medication/co-pay assistance.  1-800-813-HOPE 838-212-6864) ? [place] Assists [place] cancer patients and their families through emotional , educational and financial support.  778-460-9069 ? [place] DSS Where to apply for food stamps, Medicaid and utility assistance. 904-622-7274 ? [facility]: Transportation to medical appointments. 680 230 3688 ? Social Security Administration: May apply for disability if have a Stage IV cancer. (202) 832-1159 (763)866-1713 ? [employer], Disability and Transit Services: Assists with nutrition, care and transit needs. [facility] Support Programs: @10RELATIVEDAYS @ > Cancer Support Group  2nd Tuesday of the month 1pm-2pm, Journey Room  > Creative Journey  3rd Tuesday of the month 1130am-1pm, Journey Room  > Look Good Feel Better  1st Wednesday of the month 10am-12 noon, Journey Room (Call [facility] to register [phone])

## 2016-11-19 ENCOUNTER — Encounter: Payer: Self-pay | Admitting: Internal Medicine

## 2016-12-07 ENCOUNTER — Other Ambulatory Visit (HOSPITAL_COMMUNITY): Payer: Self-pay | Admitting: Adult Health

## 2016-12-07 ENCOUNTER — Other Ambulatory Visit (HOSPITAL_COMMUNITY): Payer: Self-pay | Admitting: Oncology

## 2016-12-07 DIAGNOSIS — R112 Nausea with vomiting, unspecified: Secondary | ICD-10-CM

## 2017-01-13 ENCOUNTER — Ambulatory Visit (INDEPENDENT_AMBULATORY_CARE_PROVIDER_SITE_OTHER): Payer: Medicare Other | Admitting: Gastroenterology

## 2017-01-13 ENCOUNTER — Encounter: Payer: Self-pay | Admitting: Gastroenterology

## 2017-01-13 VITALS — BP 133/90 | HR 107 | Temp 98.0°F | Ht 67.5 in | Wt 176.0 lb

## 2017-01-13 DIAGNOSIS — B370 Candidal stomatitis: Secondary | ICD-10-CM | POA: Diagnosis not present

## 2017-01-13 DIAGNOSIS — R112 Nausea with vomiting, unspecified: Secondary | ICD-10-CM

## 2017-01-13 DIAGNOSIS — K5900 Constipation, unspecified: Secondary | ICD-10-CM

## 2017-01-13 DIAGNOSIS — R1310 Dysphagia, unspecified: Secondary | ICD-10-CM

## 2017-01-13 DIAGNOSIS — R1319 Other dysphagia: Secondary | ICD-10-CM | POA: Insufficient documentation

## 2017-01-13 MED ORDER — FLUCONAZOLE 100 MG PO TABS: ORAL_TABLET | ORAL | 0 refills | Status: DC

## 2017-01-13 NOTE — Progress Notes (Addendum)
Primary Care Physician:  Octavio Graves, DO Referring provider: Robynn Pane, PA-C with [facility].  Primary Gastroenterologist:  Garfield Cornea, MD   Chief Complaint  Patient presents with  . Dysphagia    completed radiation; hasn't had solid food since 01/2014    HPI:  Candace Lewis is a 76 y.o. female here at request of Robynn Pane at [facility] for further evaluation of dysphagia. She has a history of stage IV a squamous cell carcinoma of the tongue status post concurrent chemoradiation. Completed therapy in 2015. Currently being followed by Dr. Benjamine Mola. Laryngoscopy in March negative for recurrence of disease. 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 Continues to see Dr. Roxy Horseman for left upper lobe pulmonary nodule concerning for slow-growing bronchogenic carcinoma the biopsy in 2017 benign. Used to see Dr. Britta Mccreedy before he left his practice.    Patient states she has been on a soft diet since 2015. Has been on Prilosec since that time. She denies issues with nausea and vomiting prior to her Peg placement in 2015. She reports follow-up EGD for the nausea and vomiting by Dr. Britta Mccreedy but couldn't find a reason. She reports having an equivocal gastric emptying study and was offered metoclopramide but she was scared potential side effects. States she had a barium esophagram after her radiation treatment.  States she takes Zofran regularly where she will vomit. Since 2015 the longest time she is well without vomiting was 90 days. Currently it has been 12 days since last episode of emesis. Emesis usually contains hot curdled material. She consumes mostly just in sure mixed with milk. She notes she decrease to 2% because she was "gaining too much weight". Denies solid foods. Can't swallow them. Has to cut her pills up. Complains of constipation. Small hardball every day. Never productive. No melena or rectal bleeding. Some epigastric  discomfort/cramping especially after eating. If she coughs or bends over she vomits.   No tcs prior.   Patient states she has recurrent oral candidiasis. She tried multiple mouthwashes or losengers but has not been able to tolerate these due to pain.   Current Outpatient Prescriptions  Medication Sig Dispense Refill  . acetaminophen-codeine (TYLENOL #3) 300-30 MG tablet Take 1 tablet by mouth every 6 (six) hours as needed for moderate pain.    Marland Kitchen docusate sodium (COLACE) 100 MG capsule Take 100 mg by mouth daily.     Marland Kitchen levothyroxine (SYNTHROID) 75 MCG tablet Take 1 tablet (75 mcg total) by mouth daily before breakfast. 90 tablet 1  . omeprazole (PRILOSEC) 20 MG capsule TAKE 1 CAPSULE BY MOUTH ONCE DAILY 30 capsule 3  . ondansetron (ZOFRAN-ODT) 8 MG disintegrating tablet 8 mg every 8 (eight) hours as needed.     . promethazine (PHENERGAN) 25 MG tablet TAKE 1 TABLET BY MOUTH EVERY 6 HOURS AS NEEDED FOR NAUSEA AND VOMITING 90 tablet 1   No current facility-administered medications for this visit.     Allergies as of 01/13/2017 - Review Complete 01/13/2017  Allergen Reaction Noted  . Adhesive [tape] Rash 10/16/2015  . Latex Rash 10/16/2015    Past Medical History:  Diagnosis Date  . Arthritis    lower back  . Constipation   . Diverticulitis   . Ectopic pregnancy 1977  . Esophageal cancer ([facility]) 2015   tongue cancer, throat cancer  . GERD (gastroesophageal reflux disease)   . Headache    migraines  . Hypertension    no  longer taking meds  . Hypokalemia 2015  . Lung nodule, multiple 01/08/2015  . Macular degeneration of right eye   . Malignant neoplasm of tongue ([facility]) 01/24/2014  . Thyroid disease     Past Surgical History:  Procedure Laterality Date  . BIOPSY PHARYNX  2015  . BREAST BIOPSY Bilateral    benign  . CHOLECYSTECTOMY  2005  . ESOPHAGOGASTRODUODENOSCOPY  06/19/2014   Dr. Britta Mccreedy: [place] Class B esophagitis. PEG tube in place. erythema in proximal esophagus.   Sherilyn Dacosta PLACEMENT N/A 10/28/2015   Procedure: PLACEMENT OF FUDUCIAL TIMES THREE; LEFT UPPER LOBE BIOPSY;  Surgeon: Grace Isaac, MD;  Location: [facility];  Service: Thoracic;  Laterality: N/A;  . MASTECTOMY, PARTIAL  1984   no cancer, fibrocystic breast disease  . SKIN SURGERY  1960   moles removed from various areas per patient report  . TONSILLECTOMY  1950   per patient report  . TUBAL LIGATION  1976  . VAGINAL HYSTERECTOMY  1981   vaginal hyst per patient report  . VIDEO BRONCHOSCOPY WITH ENDOBRONCHIAL NAVIGATION N/A 10/28/2015   Procedure: VIDEO BRONCHOSCOPY WITH ENDOBRONCHIAL NAVIGATION;  Surgeon: Grace Isaac, MD;  Location: [facility];  Service: Thoracic;  Laterality: N/A;    History reviewed. No pertinent family history.  Social History   Social History  . Marital status: Married    Spouse name: N/A  . Number of children: N/A  . Years of education: N/A   Occupational History  . Not on file.   Social History Main Topics  . Smoking status: Former Smoker    Packs/day: 1.00    Years: 23.00    Types: Cigarettes    Quit date: 05/03/1982  . Smokeless tobacco: Never Used  . Alcohol use No  . Drug use: No  . Sexual activity: Not on file   Other Topics Concern  . Not on file   Social History Narrative  . No narrative on file      ROS:  General: Negative for anorexia, weight loss, fever, chills, fatigue, weakness. Eyes: Negative for vision changes.  ENT: Negative for hoarseness,  nasal congestion. See hpi CV: Negative for chest pain, angina, palpitations, dyspnea on exertion, peripheral edema.  Respiratory: Negative for dyspnea at rest, dyspnea on exertion, cough, sputum, wheezing.  GI: See history of present illness. GU:  Negative for dysuria, hematuria, urinary incontinence, urinary frequency, nocturnal urination.  MS: Negative for joint pain, low back pain.  Derm: Negative for rash or itching.  Neuro: Negative for weakness, abnormal sensation, seizure,  frequent headaches, memory loss, confusion.  Psych: Negative for anxiety, depression, suicidal ideation, hallucinations.  Endo: Negative for unusual weight change.  Heme: Negative for bruising or bleeding. Allergy: Negative for rash or hives.    Physical Examination:  BP 133/90   Pulse (!) 107   Temp 98 F (36.7 C) (Oral)   Ht 5' 7.5" (1.715 m)   Wt 176 lb (79.8 kg)   BMI 27.16 kg/m    General: Well-nourished, well-developed in no acute distress.  Head: Normocephalic, atraumatic.   Eyes: Conjunctiva pink, no icterus. Mouth: Oropharyngeal mucosa moist and pink , no erythema or exudate. White patches on tongue.  Neck: Supple without thyromegaly, masses, or lymphadenopathy.  Lungs: Clear to auscultation bilaterally.  Heart: Regular rate and rhythm, no murmurs rubs or gallops.  Abdomen: Bowel sounds are normal, nontender, nondistended, no hepatosplenomegaly or masses, no abdominal bruits or    hernia , no rebound or guarding.  Rectal: not performed Extremities: No lower extremity edema. No clubbing or deformities.  Neuro: Alert and oriented x 4 , grossly normal neurologically.  Skin: Warm and dry, no rash or jaundice.   Psych: Alert and cooperative, normal mood and affect.  Labs: Lab Results  Component Value Date   TSH 1.766 11/16/2016   Lab Results  Component Value Date   CREATININE 0.98 11/16/2016   BUN 24 (H) 11/16/2016   NA 138 11/16/2016   K 3.9 11/16/2016   CL 102 11/16/2016   CO2 28 11/16/2016   Lab Results  Component Value Date   WBC 3.7 (L) 11/16/2016   HGB 13.6 11/16/2016   HCT 39.8 11/16/2016   MCV 84.3 11/16/2016   PLT 146 (L) 11/16/2016   Lab Results  Component Value Date   ALT 14 07/17/2016   AST 19 07/17/2016   ALKPHOS 74 07/17/2016   BILITOT 0.4 07/17/2016     Imaging Studies: No results found.

## 2017-01-13 NOTE — Patient Instructions (Signed)
1. I will obtain copy of your records for review.  2. Start Diflucan, take 2 (200mg ) on day 1, then 1 (100mg ) for 14 additional days.  3. Further recommendations to follow review of your records.

## 2017-01-14 ENCOUNTER — Ambulatory Visit (INDEPENDENT_AMBULATORY_CARE_PROVIDER_SITE_OTHER): Payer: Medicare Other | Admitting: Otolaryngology

## 2017-01-14 ENCOUNTER — Encounter: Payer: Self-pay | Admitting: Gastroenterology

## 2017-01-14 DIAGNOSIS — Z85810 Personal history of malignant neoplasm of tongue: Secondary | ICD-10-CM | POA: Diagnosis not present

## 2017-01-14 DIAGNOSIS — K5900 Constipation, unspecified: Secondary | ICD-10-CM | POA: Insufficient documentation

## 2017-01-14 NOTE — Assessment & Plan Note (Signed)
Oral candidiasis. Intolerant of mouthwashes etc. Burns her tongue from prior radiation. Provided diflucan today.

## 2017-01-14 NOTE — Assessment & Plan Note (Signed)
Daily BM, but small and hard stools. Does not eat solid food. Continue current regimen for now. May require changes if not improvement once dysphagia is addressed.

## 2017-01-14 NOTE — Assessment & Plan Note (Addendum)
Recurrent nausea and vomiting since 2015. Workup included gastric emptying study. She's had a EGD couple years ago. We have requested these records. Takes Zofran around-the-clock. Further recommendations to follow.

## 2017-01-14 NOTE — Assessment & Plan Note (Signed)
Solid food esophageal dysphagia. Has been unable to eat solid foods since her radiation treatment. Maintaining weight. Obtain records. Likely pursue EGD with dilation in the near future. I have discussed the risks, alternatives, benefits with regards to but not limited to the risk of reaction to medication, bleeding, infection, perforation and the patient is agreeable to proceed. Written consent to be obtained.

## 2017-01-14 NOTE — Progress Notes (Signed)
Please [place]

## 2017-01-14 NOTE — Addendum Note (Signed)
Addended by: Mahala Menghini on: 01/14/2017 01:01 PM   Modules accepted: Level of Service

## 2017-01-14 NOTE — Progress Notes (Signed)
Received records. Diagnostic EGD 06/19/2014 by Dr. Britta Mccreedy for history of vomiting and weight loss. LA class B esophagitis noted. Erythema was found in the proximal esophagus consistent with radiation therapy. PEG tube in place, stomach appeared normal otherwise. Recommended Prilosec 20 mg twice a day.  Modified barium swallow study dated 02/01/2015 Minimal oropharyngeal dysphagia characterized by reduced and prolonged mastication, spillover to the level of the vallecula secondary to mildly reduced oral motor strength/ROS. No aspiration. Trace laryngeal penetration of thin liquids. Her impairments were felt to produce limitations and consuming sufficient by mouth to maintain adequate hydration and nutrition they're worse when through speech therapy.  Barium esophagram 04/10/2015 area of minimal narrowing just below the pharynx and the proximal cervical esophagus although liquids pass readily. When administering the barium tablet, there was brief delay at this level although the tablet passed with a subsequent swallow water.

## 2017-01-14 NOTE — Progress Notes (Signed)
CC'D TO PCP °

## 2017-01-17 ENCOUNTER — Other Ambulatory Visit (HOSPITAL_COMMUNITY): Payer: Self-pay | Admitting: Oncology

## 2017-01-17 DIAGNOSIS — E039 Hypothyroidism, unspecified: Secondary | ICD-10-CM

## 2017-01-19 ENCOUNTER — Other Ambulatory Visit (HOSPITAL_COMMUNITY): Payer: Self-pay

## 2017-01-19 DIAGNOSIS — E039 Hypothyroidism, unspecified: Secondary | ICD-10-CM

## 2017-01-19 MED ORDER — LEVOTHYROXINE SODIUM 75 MCG PO TABS: 75.0000 ug | ORAL_TABLET | Freq: Every day | ORAL | 1 refills | Status: DC

## 2017-01-19 NOTE — Telephone Encounter (Signed)
Received in-basket message from front office that patient called needing refill on synthroid. Reviewed with provider, chart checked and refilled.

## 2017-01-25 ENCOUNTER — Other Ambulatory Visit: Payer: Self-pay | Admitting: Cardiothoracic Surgery

## 2017-01-25 ENCOUNTER — Other Ambulatory Visit: Payer: Self-pay | Admitting: Thoracic Surgery (Cardiothoracic Vascular Surgery)

## 2017-01-25 DIAGNOSIS — R918 Other nonspecific abnormal finding of lung field: Secondary | ICD-10-CM

## 2017-01-26 ENCOUNTER — Other Ambulatory Visit: Payer: Self-pay | Admitting: Cardiothoracic Surgery

## 2017-01-26 DIAGNOSIS — R918 Other nonspecific abnormal finding of lung field: Secondary | ICD-10-CM

## 2017-02-09 ENCOUNTER — Telehealth: Payer: Self-pay | Admitting: Internal Medicine

## 2017-02-09 NOTE — Telephone Encounter (Signed)
Pt was seen by LSL back in Sept and was checking to see if we received the records from Dr Wylie Hail office. I told her yes because they have been scanned into her chart. She ask when would she hear something from [place] about getting her scheduled for a procedure. I told her that I would have to put a note in for the nurse to see what the extender's recommendations were and call her back. Please call [phone]

## 2017-02-09 NOTE — Telephone Encounter (Signed)
Routing to LSL 

## 2017-02-11 ENCOUNTER — Ambulatory Visit: Payer: Medicare Other | Admitting: Cardiothoracic Surgery

## 2017-02-15 ENCOUNTER — Other Ambulatory Visit: Payer: Self-pay

## 2017-02-15 DIAGNOSIS — Z85810 Personal history of malignant neoplasm of tongue: Secondary | ICD-10-CM

## 2017-02-15 DIAGNOSIS — R1013 Epigastric pain: Secondary | ICD-10-CM

## 2017-02-15 DIAGNOSIS — R1310 Dysphagia, unspecified: Secondary | ICD-10-CM

## 2017-02-15 DIAGNOSIS — R112 Nausea with vomiting, unspecified: Secondary | ICD-10-CM

## 2017-02-15 NOTE — Telephone Encounter (Addendum)
Sorry for delay. Waiting to discuss with RMR. Please schedule EGD/ED in OR with RMR. Dx: N/V, dysphagia, epig pain, h/o tongue cancer s/p radiation 2015

## 2017-02-15 NOTE — Telephone Encounter (Signed)
Called and informed pt. EGD/ED w/Propofol with RMR scheduled for 03/11/17 at 10:30am. Will mail instructions after pre-op is scheduled.

## 2017-02-15 NOTE — Telephone Encounter (Signed)
Candace Lewis, please schedule pt and let her know.

## 2017-02-16 NOTE — Telephone Encounter (Signed)
Pre-op appt 03/05/17 at 1:45pm. Letter mailed with procedure instructions.

## 2017-02-18 ENCOUNTER — Encounter: Payer: Self-pay | Admitting: Cardiothoracic Surgery

## 2017-02-18 ENCOUNTER — Ambulatory Visit (INDEPENDENT_AMBULATORY_CARE_PROVIDER_SITE_OTHER): Payer: Medicare Other | Admitting: Cardiothoracic Surgery

## 2017-02-18 ENCOUNTER — Ambulatory Visit
Admission: RE | Admit: 2017-02-18 | Discharge: 2017-02-18 | Disposition: A | Discharge status: Home / Self Care | Payer: Medicare Other | Source: Ambulatory Visit | Attending: Cardiothoracic Surgery | Admitting: Cardiothoracic Surgery

## 2017-02-18 VITALS — BP 115/77 | HR 90 | Ht 67.5 in | Wt 176.0 lb

## 2017-02-18 DIAGNOSIS — R918 Other nonspecific abnormal finding of lung field: Secondary | ICD-10-CM

## 2017-02-18 DIAGNOSIS — Z85810 Personal history of malignant neoplasm of tongue: Secondary | ICD-10-CM

## 2017-02-18 DIAGNOSIS — Z9889 Other specified postprocedural states: Secondary | ICD-10-CM

## 2017-02-18 NOTE — Progress Notes (Signed)
[address]       [place] [address]             [phone]                    Candace Lewis [facility] Medical Record #[id number] Date of Birth: 1940-08-08  Referring: Everardo All, MD Primary Care: Octavio Graves, DO  Chief Complaint:    Chief Complaint  Patient presents with  . Follow-up    Super D CT left upper lung lesion    History of Present Illness:    Candace Lewis 76 y.o. female Who has been followed in the office for groundglass opacity  in the lung of the left upper lobe.for several years.  The patient has a complex history of present previous cancer of the head and neck. 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 Since that time she's continued to have trouble taking a diet and having episodic spells of vomiting and inability to swallow.  She'd had a gastrostomy tube in the past but this has been removed.  In June 2017 we did a navigation bronchoscopy to  a semisolid left apical pulmonary nodule.  The cytology and biopsy results show no evidence of malignancy.   The patient remains very apprehensive about having any invasive procedures none, she notes that the past several years with her chemotherapy and radiation treatment of her head and neck cancer has been very difficult for her. She continues to have significant swallowing problems and is unable to take solid food.   She notes in the near future she is to have upper GI endoscopy and possibly dilatation in [place].   Current Activity/ Functional Status:  Patient is independent with mobility/ambulation, transfers, ADL's, IADL's.   Zubrod Score: At the time of surgery this patient's most appropriate activity status/level should be described as: []     0    Normal  activity, no symptoms [x]     1    Restricted in physical strenuous activity but ambulatory, able to do out light work []     2    Ambulatory and capable of self care, unable to do work activities, up and about               >50 % of waking hours                              []     3    Only limited self care, in bed greater than 50% of waking hours []     4    Completely disabled, no self care, confined to bed or chair []     5    Moribund   Past Medical History   Hypertension    High cholesterol    High triglycerides    Squamous cell cancer of tongue (*)    Colon cancer (*)  cancer tongue  Right ear pain August 2015 since diagnosed with cancer tongue  Hypokalemia    Thrush of mouth and esophagus (*)  Dr Lisbeth Renshaw examined Radiation visit. Script Diflucan given.  Diverticulitis      Past Surgical History:  Procedure Laterality Date  . BIOPSY PHARYNX  2015  .  BREAST BIOPSY Bilateral    benign  . CHOLECYSTECTOMY  2005  . ESOPHAGOGASTRODUODENOSCOPY  06/19/2014   Dr. Britta Mccreedy: LA Class B esophagitis. PEG tube in place. erythema in proximal esophagus.   Sherilyn Dacosta PLACEMENT N/A 10/28/2015   Procedure: PLACEMENT OF FUDUCIAL TIMES THREE; LEFT UPPER LOBE BIOPSY;  Surgeon: Grace Isaac, MD;  Location: [facility];  Service: Thoracic;  Laterality: N/A;  . MASTECTOMY, PARTIAL  1984   no cancer, fibrocystic breast disease  . SKIN SURGERY  1960   moles removed from various areas per patient report  . TONSILLECTOMY  1950   per patient report  . TUBAL LIGATION  1976  . VAGINAL HYSTERECTOMY  1981   vaginal hyst per patient report  . VIDEO BRONCHOSCOPY WITH ENDOBRONCHIAL NAVIGATION N/A 10/28/2015   Procedure: VIDEO BRONCHOSCOPY WITH ENDOBRONCHIAL NAVIGATION;  Surgeon: Grace Isaac, MD;  Location: [facility];  Service: Thoracic;  Laterality: N/A;   [place]  mole removal  [place]    GALLBLADDER SURGERY 2005    EMPHYSEMA     PEG TUBE PLACEMENT 01/29/2014    PORTACATH PLACEMENT 01/29/2014       Family History: Patient's mother died of COPD and heart failure, father died of diabetes  Social History   Social History  . Marital Status: Married    Spouse Name: N/A  . Number of Children: N/A  . Years of Education: N/A   Occupational History  . 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   Social History Main Topics  . Smoking status: Former Smoker -- 1.00 packs/day for 23 years    Types: Cigarettes    Quit date: 05/03/1982  . Smokeless tobacco: Never Used  . Alcohol Use: Not on file  . Drug Use: Not on file  . Sexual Activity: Not on file     History  Smoking Status  . Former Smoker  . Packs/day: 1.00  . Years: 23.00  . Types: Cigarettes  . Quit date: 05/03/1982  Smokeless Tobacco  . Never Used    History  Alcohol Use No     Allergies  Allergen Reactions  . Adhesive [Tape] Rash    Reports rash and blistering if any adhesive tape  . Latex Rash    Rash and skin blistering per patient report    Current Outpatient Prescriptions  Medication Sig Dispense Refill  . acetaminophen-codeine (TYLENOL #3) 300-30 MG tablet Take 1 tablet by mouth every 6 (six) hours as needed for moderate pain.    Marland Kitchen docusate sodium (COLACE) 100 MG capsule Take 100 mg by mouth daily.     Marland Kitchen levothyroxine (SYNTHROID) 75 MCG tablet Take 1 tablet (75 mcg total) by mouth daily before breakfast. 90 tablet 1  . omeprazole (PRILOSEC) 20 MG capsule TAKE 1 CAPSULE BY MOUTH ONCE DAILY 30 capsule 3  . ondansetron (ZOFRAN-ODT) 8 MG disintegrating tablet 8 mg every 8 (eight) hours as needed.     . promethazine (PHENERGAN) 25 MG tablet TAKE 1 TABLET BY MOUTH EVERY 6 HOURS AS NEEDED FOR NAUSEA AND VOMITING 90 tablet 1   No current facility-administered  medications for this visit.      Review of Systems:     Cardiac Review of Systems: Y or N  Chest Pain [  n  ]  Resting SOB [n   ] Exertional SOB  [ n ]  Orthopnea [n ]   Pedal Edema [ n  ]    Palpitations [ n ] Syncope  [ n ]   Presyncope [ n ]  General Review of Systems: [Y] = yes [  ]=no Constitional: recent weight change [  ];  Wt loss over the last 3 months [ n  ] anorexia [  ]; fatigue [  ]; nausea [  ]; night sweats [  ]; fever [  ]; or chills [  ];          Dental: poor dentition[ dentures ]; Last Dentist visit:   Eye : blurred vision [  ]; diplopia [   ]; vision changes [  ];  Amaurosis fugax[  ]; Resp: cough [  ];  wheezing[ y];  hemoptysis[  ]; shortness of breath[  ]; paroxysmal nocturnal dyspnea[  ]; dyspnea on exertion[  ]; or orthopnea[  ];  GI:  gallstones[  ], vomiting[ y ];  dysphagia[y  ]; melena[ n ];  hematochezia [n  ]; heartburn[  ];   Hx of  Colonoscopy[  ]; GU: kidney stones [  ]; hematuria[  ];   dysuria [  ];  nocturia[  ];  history of     obstruction [  ]; urinary frequency [  ]             Skin: rash, swelling[  ];, hair loss[  ];  peripheral edema[  ];  or itching[  ]; Musculosketetal: myalgias[  ];  joint swelling[  ];  joint erythema[  ];  joint pain[  ];  back pain[  ];  Heme/Lymph: bruising[  ];  bleeding[  ];  anemia[  ];  Neuro: TIA[  ];  headaches[  ];  stroke[  ];  vertigo[  ];  seizures[  ];   paresthesias[  ];  difficulty walking[  ];  Psych:depression[  ]; anxiety[  ];  Endocrine: diabetes[n  ];  thyroid dysfunction[  ];  Immunizations: Flu up to date [ y ]; Pneumococcal up to date [ n ];  Other:  Physical Exam: BP 115/77   Pulse 90   Ht 5' 7.5" (1.715 m)   Wt 176 lb (79.8 kg)   SpO2 97%   BMI 27.16 kg/m   PHYSICAL EXAMINATION: General appearance: alert, cooperative and no distress Head: Normocephalic, without obvious abnormality, atraumatic Neck: no adenopathy, no carotid bruit, no JVD, supple, symmetrical, trachea midline, thyroid not  enlarged, symmetric, no tenderness/mass/nodules and There is thickening of the skin of the neck but node definite palpable cervical adenopathy or cervical mass Lymph nodes: Cervical, supraclavicular, and axillary nodes normal. Resp: clear to auscultation bilaterally Back: symmetric, no curvature. ROM normal. No CVA tenderness. Cardio: regular rate and rhythm, S1, S2 normal, no murmur, click, rub or gallop GI: soft, non-tender; bowel sounds normal; no masses,  no organomegaly Extremities: extremities normal, atraumatic, no cyanosis or edema and Homans sign is negative, no sign of DVT Neurologic: Grossly normal  Diagnostic Studies & Laboratory data:     Recent Radiology Findings:  Ct Super D Chest Wo Contrast  Result Date: 02/18/2017 CLINICAL DATA:  Pulmonary nodule.  History of throat cancer. EXAM: CT CHEST WITHOUT CONTRAST TECHNIQUE: Multidetector CT imaging of the chest was performed using thin slice collimation for electromagnetic bronchoscopy planning purposes, without intravenous contrast. COMPARISON:  08/27/2016 FINDINGS: Cardiovascular: The heart size is normal. No  pericardial effusion. Coronary artery calcification is evident. Atherosclerotic calcification is noted in the wall of the thoracic aorta. Mediastinum/Nodes: Small mediastinal lymph nodes measure up to a maximum of about 9 mm short axis in the precarinal station. No evidence for gross hilar lymphadenopathy although assessment is limited by the lack of intravenous contrast on today's study. The esophagus has normal imaging features. There is no axillary lymphadenopathy. Lungs/Pleura: Biapical pleural-parenchymal scarring is similar to prior. Similar appearance mixed attenuation irregular nodule posterior left upper lobe, measuring 2.0 x 2.2 cm today compared to 2.2 x 2.4 cm previously. 3 mm right upper lobe nodule (image 32 series 4) is stable. 3 mm ground-glass nodule in the right upper lobe on image 41 is unchanged. Left upper lobe  fiducial markers are evident. No pulmonary edema or pleural effusion. Upper Abdomen: Unremarkable. Musculoskeletal: Bone windows reveal no worrisome lytic or sclerotic osseous lesions. IMPRESSION: 1. Similar appearance mixed attenuation left upper lobe pulmonary lesion. 2. Exam also otherwise stable with no new or progressive findings. Electronically Signed   By: Misty Stanley M.D.   On: 02/18/2017 15:09    Ct Chest Wo Contrast  Result Date: 08/27/2016 CLINICAL DATA:  LEFT lung nodule. Head neck cancer with radiation and chemotherapy EXAM: CT CHEST WITHOUT CONTRAST TECHNIQUE: Multidetector CT imaging of the chest was performed following the standard protocol without IV contrast. COMPARISON:  CT 02/20/2016 FINDINGS: Cardiovascular: Coronary artery calcification and aortic atherosclerotic calcification. Mediastinum/Nodes: No axillary supraclavicular adenopathy. No mediastinal hilar adenopathy. No pericardial. Esophagus normal Lungs/Pleura: Ground-glass nodule in the superior aspect of the LEFT upper lobe has ill-defined borders and measures approximately 22 x 24 mm compared with 22 by 24 mm on prior (02/20/2016) for. Nodular peribronchial component measuring 4 mm (image 23, series 4) within lesion compares 6 mm on prior. No addition additional pulmonary nodules within the lungs. Airways normal. Upper Abdomen: Limited view of the liver, kidneys, pancreas are unremarkable. Normal adrenal glands. Musculoskeletal: No aggressive osseous lesion. IMPRESSION: No change in LEFT upper lobe ground-glass nodule with central solid nodule. Lesion remains concerning for atypical adenomatous hyperplasia. Recommend continued CT surveillance. Electronically Signed   By: Suzy Bouchard M.D.   On: 08/27/2016 12:32   Ct Chest Wo Contrast  Result Date: 02/20/2016 CLINICAL DATA:  Followup lung mass EXAM: CT CHEST WITHOUT CONTRAST TECHNIQUE: Multidetector CT imaging of the chest was performed following the standard protocol  without IV contrast. COMPARISON:  10/24/2015 FINDINGS: Cardiovascular: Aortic atherosclerosis. Normal heart size. No pericardial effusion. Mediastinum/Nodes: The trachea appears patent and is midline. Normal appearance of the esophagus. No enlarged mediastinal or hilar lymph nodes. Lungs/Pleura: No pleural effusion. Mild changes of centrilobular emphysema identified. The sub solid lesion within the left upper lobe Measures 2.2 cm, image 28 of series 4. This is unchanged from previous exam. The internal solid component Measures 6 mm, image 24 of series 4. Previously 7 mm. No new pulmonary nodules or masses identified. Upper Abdomen: No acute abnormality. Musculoskeletal: No chest wall mass or suspicious bone lesions identified. IMPRESSION: 1. No acute cardiopulmonary abnormalities. 2. Stable part solid nodule within the left upper lobe which remains suspicious for low-grade adenocarcinoma (atypical adenomatous hyperplasia). 3. Emphysema 4. Aortic atherosclerosis. Electronically Signed   By: Kerby Moors M.D.   On: 02/20/2016 11:45   Nm Pet Image Initial (pi) Skull Base To Thigh  10/24/2015  CLINICAL DATA:  Subsequent treatment strategy for lung mass. History of tongue cancer. EXAM: NUCLEAR MEDICINE PET SKULL BASE TO THIGH TECHNIQUE: 8.4 mCi F-18  FDG was injected intravenously. Full-ring PET imaging was performed from the skull base to thigh after the radiotracer. CT data was obtained and used for attenuation correction and anatomic localization. FASTING BLOOD GLUCOSE:  Value: 99 mg/dl COMPARISON:  Chest CT 10/07/2015. Abdominal pelvic CT of 09/18/2015. Most recent PET of 07/03/2014. FINDINGS: NECK Extensive muscular activity throughout the neck. Given this factor, no cervical hypermetabolic nodes identified. Relatively diffuse tongue hypermetabolism is without CT correlate and most likely due to motion after radiopharmaceutical injection. Similarly, hypermetabolism about the posterior aspect of the larynx is  symmetric and likely physiologic. CHEST Low-level hypermetabolism which corresponds to the sub solid left apical pulmonary nodule. This measures 2.8 cm and a S.U.V. max of 1.7 on image 14/series 8. compare a S.U.V. max of 2.2 on the 07/03/2014 exam. No thoracic nodal hypermetabolism. ABDOMEN/PELVIS No areas of abnormal hypermetabolism. SKELETON Focus of hypermetabolism about the anterior right acetabulum is favored to be related to tendinous strain at the origin of thigh extensor musculature. This measures a S.U.V. max of 5.8. Similarly, there is hypermetabolism about the anterior left femoral head/ neck junction which is favored to be due to adjacent muscular strain. No well-defined osseous lesion in either site. CT IMAGES PERFORMED FOR ATTENUATION CORRECTION No cervical adenopathy. Bilateral carotid atherosclerosis. Thoracic aortic and branch vessel atherosclerosis. LAD coronary artery atherosclerosis. Chest findings deferred to recent diagnostic CT. Lower pole right renal fluid density lesion is likely a cyst. Abdominal aortic atherosclerosis. Cholecystectomy. Hysterectomy. IMPRESSION: 1. Low-level, non malignant range hypermetabolism corresponding to the left apical sub solid pulmonary nodule. This remains suspicious for low-grade adenocarcinoma, given morphology and interval enlargement on prior diagnostic CT. 2. Diffuse tongue hypermetabolism without CT correlate. Recommend physical exam correlation to confirm physiologic or related to motion after pharmaceutical injection. 3. No evidence of hypermetabolic nodes to suggest metastatic disease. 4.  Coronary artery atherosclerosis. Aortic atherosclerosis. Electronically Signed   By: Abigail Miyamoto M.D.   On: 10/24/2015 10:09      CLINICAL DATA: Followup of pulmonary nodule. Throat cancer in 2015 with chemotherapy and radiation therapy.  EXAM: CT CHEST WITHOUT CONTRAST  TECHNIQUE: Multidetector CT imaging of the chest was performed following  the standard protocol without IV contrast.  COMPARISON: Chest radiograph 09/18/2015. Abdominal pelvic CT 09/18/2015. Chest CT 04/05/2015.  FINDINGS: Mediastinum/Nodes: Aortic and branch vessel atherosclerosis. Normal heart size, without pericardial effusion. LAD coronary artery atherosclerosis. No mediastinal or definite hilar adenopathy, given limitations of unenhanced CT.  Lungs/Pleura: No pleural fluid. Biapical pleural-parenchymal scarring. A 3 mm right upper lobe pulmonary nodule on image 29/series 4 is felt to be similar to on the prior exam. There are smaller right apical pulmonary nodules, including at 3 mm on image 30/ series 4, which are likely more apparent today secondary to differences in slice thickness.  A 3 mm right lower lobe subpleural nodule on image 69/series 4 similar.  Sub solid left apical pulmonary nodule measures 2.9 x 2.9 cm on image 30/series 4. Compare 2.0 x 1.5 cm on the most recent exam. The solid components measure on the order of 8 mm on image 27/series 4 and are also felt to be increased.  Upper abdomen: Cholecystectomy. Normal imaged portions of the liver, spleen, stomach, pancreas, adrenal glands, kidneys.  Musculoskeletal: Nodularity in the lateral left breast measures 1.3 cm on image 70/series 2 and is unchanged. No acute osseous abnormality.  IMPRESSION: 1. Increase in size of a sub solid left apical pulmonary nodule. This remains suspicious for a low-grade adenocarcinoma, which had  regressed on the prior exam secondary to chemotherapy. 2. Other pulmonary nodules are primarily similar. Some right apical nodules are more conspicuous today, favored to be due to differences in slice thickness. 3. Atherosclerosis, including within the coronary arteries. 4. Similar left breast nodule which could be re-evaluated at followup or more entirely characterized with mammogram/ultrasound. 5. No thoracic adenopathy to suggest nodal  metastasis.   Electronically Signed By: Abigail Miyamoto M.D. On: 10/07/2015 14:02  Final Report  CLINICAL DATA: 76 year old female with history of multiple pulmonary nodules. Followup study. Additional history of tongue cancer.  EXAM: CT CHEST WITH CONTRAST  TECHNIQUE: Multidetector CT imaging of the chest was performed during intravenous contrast administration.  CONTRAST: 60 mL of Isovue 370.  COMPARISON: Chest CT 01/04/2015. PET-CT 07/03/2014.  FINDINGS: Mediastinum/Lymph Nodes: Heart size is normal. There is no significant pericardial fluid, thickening or pericardial calcification. There is atherosclerosis of the thoracic aorta, the great vessels of the mediastinum and the coronary arteries, including calcified atherosclerotic plaque in the left anterior descending coronary artery. No pathologically enlarged mediastinal or hilar lymph nodes. Esophagus is unremarkable in appearance. No axillary lymphadenopathy. Right internal jugular single-lumen porta cath with tip terminating in the distal superior vena cava.  Lungs/Pleura: Again noted is an ill-defined nodular area in the apex of the left upper lobe, which is predominantly ground-glass attenuation (image 15 of series 4) measuring 2.0 x 1.5 cm (slightly smaller than prior study 01/04/2015 at which point this measured 2.1 x 2.2 cm), with a tiny central solid component measuring only 4 mm (image 15 of series 2), which is also slightly less apparent than prior studies. No other new suspicious appearing pulmonary nodules or masses are identified. The no acute consolidative airspace disease. No pleural effusions.  Upper Abdomen: Status post cholecystectomy.  Musculoskeletal/Soft Tissues: There are no aggressive appearing lytic or blastic lesions noted in the visualized portions of the skeleton.  IMPRESSION: 1. Previously described mixed solid and sub solid nodule in the apex of the left upper lobe is less apparent than  prior examinations. Although the regression is reassuring, based on behavior on prior examinations, this remains concerning for potential low-grade adenocarcinoma, and the regression may simply reflect response to chemotherapy for the patient's head neck cancer. Continued attention on future followup examinations is recommended. 2. No other suspicious appearing pulmonary nodules or masses in the lungs at this time to suggest metastatic disease. 3. Atherosclerosis, including left anterior descending coronary artery disease. Please note that although the presence of coronary artery calcium documents the presence of coronary artery disease, the severity of this disease and any potential stenosis cannot be assessed on this non-gated CT examination. Assessment for potential risk factor modification, dietary therapy or pharmacologic therapy may be warranted, if clinically indicated. 4. Additional incidental findings, as above.   Electronically Signed By: Vinnie Langton M.D. On: 04/05/2015 13:17    Recent Lab Findings: Lab Results  Component Value Date   WBC 3.7 (L) 11/16/2016   HGB 13.6 11/16/2016   HCT 39.8 11/16/2016   PLT 146 (L) 11/16/2016   GLUCOSE 115 (H) 11/16/2016   ALT 14 07/17/2016   AST 19 07/17/2016   NA 138 11/16/2016   K 3.9 11/16/2016   CL 102 11/16/2016   CREATININE 0.98 11/16/2016   BUN 24 (H) 11/16/2016   CO2 28 11/16/2016   TSH 1.766 11/16/2016   INR 1.00 10/28/2015   PFT's :  FEV1 2.10 85% DLCO 16.75 59%  1. spirometry shows minimal airflow obstruction without bronchodilator improvement.  2.lung volumes are normal. 3.airway resistance is normal. 4. DLCO moderately reduced   Assessment / Plan:   1/ History of squamous cell carcinoma of the tongue, treated with radiation and chemotherapy 2015 with continued posttreatment problems with some difficulty swallowing likely radiation induced. 2/patient has a, groundglass left apical pulmonary nodule  suspicious for low-grade carcinoma attempted biopsy with navigation bronchoscopy in June 2017 was negative for malignancy. Follow-up scan compared to 6 months ago showed little change in the area, but definitely enlargement since 2017. Again discussed with the patient proceeding with a more aggressive approach to obtain a tissue diagnosis and possibly consider stereotactic radiotherapy if appropriate.  She is very concerned about any operative intervention primarily because of her difficulty swallowing. She is agreeable to a follow-up CT scan in 6 months to confirm stability.  She is agreeable to a repeat scan but refuses any more invasive diagnosis or treatment at this time.    3/ Atherosclerosis, including left anterior descending coronary     artery disease.- by ct    We'll plan to see the patient back in 6 months with a follow-up CT scan of the chest.  Grace Isaac MD      [address] [place] [address] Office [phone]   Beeper [phone]  02/18/2017 3:53 PM

## 2017-02-22 ENCOUNTER — Telehealth: Payer: Self-pay

## 2017-02-22 NOTE — Telephone Encounter (Signed)
Pt called office. She is scheduled for EGD 03/11/17. She received her instructions in the mail. She states that she doesn't eat solid foods, drinks Ensure at 9:00am, 1:00pm, 5:00pm, and 9:00pm. She is aware that she should only have clear liquids only from 6:00pm until midnight the evening before EGD. However, she states that she knows her body and if she doesn't drink the 9:00pm Ensure she will vomit bile the next morning. States it burns and comes out of her nose. [phone].  Magda Paganini, please advise if pt can have her 9:00pm Ensure the evening before EGD.

## 2017-02-23 NOTE — Telephone Encounter (Signed)
OK to have one Ensure at 9pm night before EGD.

## 2017-02-23 NOTE — Telephone Encounter (Signed)
Called and informed pt.  

## 2017-03-02 NOTE — Patient Instructions (Signed)
Candace Lewis  03/02/2017     @PREFPERIOPPHARMACY @   Your procedure is scheduled on 03/11/2017.  Report to [facility] at 9:00 A.M.  Call this number if you have problems the morning of surgery:  [phone]   Remember:  Do not eat food or drink liquids after midnight.  Take these medicines the morning of surgery with A SIP OF WATER Tylenol #3 if needed, Synthroid, Prilosec, Zofran OR Phenergan if needed   Do not wear jewelry, make-up or nail polish.  Do not wear lotions, powders, or perfumes, or deoderant.  Do not shave 48 hours prior to surgery.  Men may shave face and neck.  Do not bring valuables to the hospital.  [facility] is not responsible for any belongings or valuables.  Contacts, dentures or bridgework may not be worn into surgery.  Leave your suitcase in the car.  After surgery it may be brought to your room.  For patients admitted to the hospital, discharge time will be determined by your treatment team.  Patients discharged the day of surgery will not be allowed to drive home.    Please read over the following fact sheets that you were given. Anesthesia Post-op Instructions     PATIENT INSTRUCTIONS POST-ANESTHESIA  IMMEDIATELY FOLLOWING SURGERY:  Do not drive or operate machinery for the first twenty four hours after surgery.  Do not make any important decisions for twenty four hours after surgery or while taking narcotic pain medications or sedatives.  If you develop intractable nausea and vomiting or a severe headache please notify your doctor immediately.  FOLLOW-UP:  Please make an appointment with your surgeon as instructed. You do not need to follow up with anesthesia unless specifically instructed to do so.  WOUND CARE INSTRUCTIONS (if applicable):  Keep a dry clean dressing on the anesthesia/puncture wound site if there is drainage.  Once the wound has quit draining you may leave it open to air.  Generally you should leave the bandage intact for  twenty four hours unless there is drainage.  If the epidural site drains for more than 36-48 hours please call the anesthesia department.  QUESTIONS?:  Please feel free to call your physician or the hospital operator if you have any questions, and they will be happy to assist you.      Esophagogastroduodenoscopy Esophagogastroduodenoscopy (EGD) is a procedure to examine the lining of the esophagus, stomach, and first part of the small intestine (duodenum). This procedure is done to check for problems such as inflammation, bleeding, ulcers, or growths. During this procedure, a long, flexible, lighted tube with a camera attached (endoscope) is inserted down the throat. Tell a health care provider about:  Any allergies you have.  All medicines you are taking, including vitamins, herbs, eye drops, creams, and over-the-counter medicines.  Any problems you or family members have had with anesthetic medicines.  Any blood disorders you have.  Any surgeries you have had.  Any medical conditions you have.  Whether you are pregnant or may be pregnant. What are the risks? Generally, this is a safe procedure. However, problems may occur, including:  Infection.  Bleeding.  A tear (perforation) in the esophagus, stomach, or duodenum.  Trouble breathing.  Excessive sweating.  Spasms of the larynx.  A slowed heartbeat.  Low blood pressure.  What happens before the procedure?  Follow instructions from your health care provider about eating or drinking restrictions.  Ask your health care provider about: ? Changing or stopping your regular  medicines. This is especially important if you are taking diabetes medicines or blood thinners. ? Taking medicines such as aspirin and ibuprofen. These medicines can thin your blood. Do not take these medicines before your procedure if your health care provider instructs you not to.  Plan to have someone take you home after the procedure.  If you  wear dentures, be ready to remove them before the procedure. What happens during the procedure?  To reduce your risk of infection, your health care team will wash or sanitize their hands.  An IV tube will be put in a vein in your hand or arm. You will get medicines and fluids through this tube.  You will be given one or more of the following: ? A medicine to help you relax (sedative). ? A medicine to numb the area (local anesthetic). This medicine may be sprayed into your throat. It will make you feel more comfortable and keep you from gagging or coughing during the procedure. ? A medicine for pain.  A mouth guard may be placed in your mouth to protect your teeth and to keep you from biting on the endoscope.  You will be asked to lie on your left side.  The endoscope will be lowered down your throat into your esophagus, stomach, and duodenum.  Air will be put into the endoscope. This will help your health care provider see better.  The lining of your esophagus, stomach, and duodenum will be examined.  Your health care provider may: ? Take a tissue sample so it can be looked at in a lab (biopsy). ? Remove growths. ? Remove objects (foreign bodies) that are stuck. ? Treat any bleeding with medicines or other devices that stop tissue from bleeding. ? Widen (dilate) or stretch narrowed areas of your esophagus and stomach.  The endoscope will be taken out. The procedure may vary among health care providers and hospitals. What happens after the procedure?  Your blood pressure, heart rate, breathing rate, and blood oxygen level will be monitored often until the medicines you were given have worn off.  Do not eat or drink anything until the numbing medicine has worn off and your gag reflex has returned. This information is not intended to replace advice given to you by your health care provider. Make sure you discuss any questions you have with your health care provider. Document  Released: 08/21/2004 Document Revised: 09/26/2015 Document Reviewed: 03/14/2015 Elsevier Interactive Patient Education  2018 [employer]. Esophageal Dilatation Esophageal dilatation is a procedure to open a blocked or narrowed part of the esophagus. The esophagus is the long tube in your throat that carries food and liquid from your mouth to your stomach. The procedure is also called esophageal dilation. You may need this procedure if you have a buildup of scar tissue in your esophagus that makes it difficult, painful, or even impossible to swallow. This can be caused by gastroesophageal reflux disease (GERD). In rare cases, people need this procedure because they have cancer of the esophagus or a problem with the way food moves through the esophagus. Sometimes you may need to have another dilatation to enlarge the opening of the esophagus gradually. Tell a health care provider about:  Any allergies you have.  All medicines you are taking, including vitamins, herbs, eye drops, creams, and over-the-counter medicines.  Any problems you or family members have had with anesthetic medicines.  Any blood disorders you have.  Any surgeries you have had.  Any medical conditions you have.  Any antibiotic medicines you are required to take before dental procedures. What are the risks? Generally, this is a safe procedure. However, problems can occur and include:  Bleeding from a tear in the lining of the esophagus.  A hole (perforation) in the esophagus.  What happens before the procedure?  Do not eat or drink anything after midnight on the night before the procedure or as directed by your health care provider.  Ask your health care provider about changing or stopping your regular medicines. This is especially important if you are taking diabetes medicines or blood thinners.  Plan to have someone take you home after the procedure. What happens during the procedure?  You will be given a  medicine that makes you relaxed and sleepy (sedative).  A medicine may be sprayed or gargled to numb the back of the throat.  Your health care provider can use various instruments to do an esophageal dilatation. During the procedure, the instrument used will be placed in your mouth and passed down into your esophagus. Options include: ? Simple dilators. This instrument is carefully placed in the esophagus to stretch it. ? Guided wire bougies. In this method, a flexible tube (endoscope) is used to insert a wire into the esophagus. The dilator is passed over this wire to enlarge the esophagus. Then the wire is removed. ? Balloon dilators. An endoscope with a small balloon at the end is passed down into the esophagus. Inflating the balloon gently stretches the esophagus and opens it up. What happens after the procedure?  Your blood pressure, heart rate, breathing rate, and blood oxygen level will be monitored often until the medicines you were given have worn off.  Your throat may feel slightly sore and will probably still feel numb. This will improve slowly over time.  You will not be allowed to eat or drink until the throat numbness has resolved.  If this is a same-day procedure, you may be allowed to go home once you have been able to drink, urinate, and sit on the edge of the bed without nausea or dizziness.  If this is a same-day procedure, you should have a friend or family member with you for the next 24 hours after the procedure. This information is not intended to replace advice given to you by your health care provider. Make sure you discuss any questions you have with your health care provider. Document Released: 06/11/2005 Document Revised: 09/26/2015 Document Reviewed: 08/30/2013 Elsevier Interactive Patient Education  2017 [employer].

## 2017-03-05 ENCOUNTER — Encounter (HOSPITAL_COMMUNITY): Payer: Self-pay

## 2017-03-05 ENCOUNTER — Encounter (HOSPITAL_COMMUNITY)
Admission: RE | Admit: 2017-03-05 | Discharge: 2017-03-05 | Disposition: A | Discharge status: Home / Self Care | Payer: Medicare Other | Source: Ambulatory Visit | Attending: Internal Medicine | Admitting: Internal Medicine

## 2017-03-05 ENCOUNTER — Other Ambulatory Visit: Payer: Self-pay

## 2017-03-05 DIAGNOSIS — Z87891 Personal history of nicotine dependence: Secondary | ICD-10-CM | POA: Diagnosis not present

## 2017-03-05 DIAGNOSIS — Z01812 Encounter for preprocedural laboratory examination: Secondary | ICD-10-CM | POA: Diagnosis present

## 2017-03-05 DIAGNOSIS — Z01810 Encounter for preprocedural cardiovascular examination: Secondary | ICD-10-CM | POA: Diagnosis present

## 2017-03-05 DIAGNOSIS — I10 Essential (primary) hypertension: Secondary | ICD-10-CM | POA: Insufficient documentation

## 2017-03-05 DIAGNOSIS — M1990 Unspecified osteoarthritis, unspecified site: Secondary | ICD-10-CM | POA: Insufficient documentation

## 2017-03-05 DIAGNOSIS — K219 Gastro-esophageal reflux disease without esophagitis: Secondary | ICD-10-CM | POA: Diagnosis not present

## 2017-03-05 DIAGNOSIS — E039 Hypothyroidism, unspecified: Secondary | ICD-10-CM | POA: Insufficient documentation

## 2017-03-05 DIAGNOSIS — R51 Headache: Secondary | ICD-10-CM | POA: Insufficient documentation

## 2017-03-05 HISTORY — DX: Hypothyroidism, unspecified: E03.9

## 2017-03-05 LAB — BASIC METABOLIC PANEL
Anion gap: 9 (ref 5–15)
BUN: 24 mg/dL — ABNORMAL HIGH (ref 6–20)
CO2: 27 mmol/L (ref 22–32)
Calcium: 9.5 mg/dL (ref 8.9–10.3)
Chloride: 102 mmol/L (ref 101–111)
Creatinine, Ser: 0.92 mg/dL (ref 0.44–1.00)
GFR calc Af Amer: >60 mL/min (ref >60)
GFR calc non Af Amer: 60 mL/min — ABNORMAL LOW (ref >60)
Glucose, Bld: 97 mg/dL (ref 65–99)
Potassium: 3.6 mmol/L (ref 3.5–5.1)
Sodium: 138 mmol/L (ref 135–145)

## 2017-03-05 LAB — CBC WITH DIFFERENTIAL/PLATELET
Basophils Absolute: 0 10*3/uL (ref 0.0–0.1)
Basophils Relative: 0 %
Eosinophils Absolute: 0.1 10*3/uL (ref 0.0–0.7)
Eosinophils Relative: 4 %
HCT: 40.3 % (ref 36.0–46.0)
Hemoglobin: 13.5 g/dL (ref 12.0–15.0)
Lymphocytes Relative: 21 %
Lymphs Abs: 0.6 10*3/uL — ABNORMAL LOW (ref 0.7–4.0)
MCH: 28.7 pg (ref 26.0–34.0)
MCHC: 33.5 g/dL (ref 30.0–36.0)
MCV: 85.6 fL (ref 78.0–100.0)
Monocytes Absolute: 0.3 10*3/uL (ref 0.1–1.0)
Monocytes Relative: 9 %
Neutro Abs: 1.9 10*3/uL (ref 1.7–7.7)
Neutrophils Relative %: 66 %
Platelets: 142 10*3/uL — ABNORMAL LOW (ref 150–400)
RBC: 4.71 MIL/uL (ref 3.87–5.11)
RDW: 13.7 % (ref 11.5–15.5)
WBC: 2.8 10*3/uL — ABNORMAL LOW (ref 4.0–10.5)

## 2017-03-11 ENCOUNTER — Telehealth: Payer: Self-pay

## 2017-03-11 ENCOUNTER — Ambulatory Visit (HOSPITAL_COMMUNITY): Payer: Medicare Other | Admitting: Anesthesiology

## 2017-03-11 ENCOUNTER — Encounter (HOSPITAL_COMMUNITY)
Admission: RE | Disposition: A | Discharge status: Home / Self Care | Payer: Self-pay | Source: Ambulatory Visit | Attending: Internal Medicine

## 2017-03-11 ENCOUNTER — Ambulatory Visit (HOSPITAL_COMMUNITY)
Admission: RE | Admit: 2017-03-11 | Discharge: 2017-03-11 | Disposition: A | Discharge status: Home / Self Care | Payer: Medicare Other | Source: Ambulatory Visit | Attending: Internal Medicine | Admitting: Internal Medicine

## 2017-03-11 DIAGNOSIS — K449 Diaphragmatic hernia without obstruction or gangrene: Secondary | ICD-10-CM | POA: Insufficient documentation

## 2017-03-11 DIAGNOSIS — M47816 Spondylosis without myelopathy or radiculopathy, lumbar region: Secondary | ICD-10-CM | POA: Insufficient documentation

## 2017-03-11 DIAGNOSIS — Z79899 Other long term (current) drug therapy: Secondary | ICD-10-CM | POA: Diagnosis not present

## 2017-03-11 DIAGNOSIS — I10 Essential (primary) hypertension: Secondary | ICD-10-CM | POA: Diagnosis not present

## 2017-03-11 DIAGNOSIS — R1013 Epigastric pain: Secondary | ICD-10-CM

## 2017-03-11 DIAGNOSIS — Z888 Allergy status to other drugs, medicaments and biological substances status: Secondary | ICD-10-CM | POA: Diagnosis not present

## 2017-03-11 DIAGNOSIS — E039 Hypothyroidism, unspecified: Secondary | ICD-10-CM | POA: Insufficient documentation

## 2017-03-11 DIAGNOSIS — Q394 Esophageal web: Secondary | ICD-10-CM

## 2017-03-11 DIAGNOSIS — K219 Gastro-esophageal reflux disease without esophagitis: Secondary | ICD-10-CM | POA: Diagnosis not present

## 2017-03-11 DIAGNOSIS — R1310 Dysphagia, unspecified: Secondary | ICD-10-CM | POA: Diagnosis not present

## 2017-03-11 DIAGNOSIS — Z8501 Personal history of malignant neoplasm of esophagus: Secondary | ICD-10-CM | POA: Insufficient documentation

## 2017-03-11 DIAGNOSIS — Z923 Personal history of irradiation: Secondary | ICD-10-CM | POA: Diagnosis not present

## 2017-03-11 DIAGNOSIS — R112 Nausea with vomiting, unspecified: Secondary | ICD-10-CM

## 2017-03-11 DIAGNOSIS — Z87891 Personal history of nicotine dependence: Secondary | ICD-10-CM | POA: Diagnosis not present

## 2017-03-11 DIAGNOSIS — Z91040 Latex allergy status: Secondary | ICD-10-CM | POA: Insufficient documentation

## 2017-03-11 DIAGNOSIS — Z85810 Personal history of malignant neoplasm of tongue: Secondary | ICD-10-CM

## 2017-03-11 DIAGNOSIS — R1314 Dysphagia, pharyngoesophageal phase: Secondary | ICD-10-CM | POA: Diagnosis present

## 2017-03-11 HISTORY — PX: ESOPHAGOGASTRODUODENOSCOPY (EGD) WITH PROPOFOL: SHX5813

## 2017-03-11 SURGERY — ESOPHAGOGASTRODUODENOSCOPY (EGD) WITH PROPOFOL
Anesthesia: Monitor Anesthesia Care

## 2017-03-11 MED ORDER — LIDOCAINE VISCOUS 2 % MT SOLN
OROMUCOSAL | Status: AC
  Filled 2017-03-11: qty 15

## 2017-03-11 MED ORDER — FENTANYL CITRATE (PF) 100 MCG/2ML IJ SOLN
INTRAMUSCULAR | Status: AC
  Filled 2017-03-11: qty 2

## 2017-03-11 MED ORDER — PROPOFOL 10 MG/ML IV BOLUS
INTRAVENOUS | Status: DC | PRN
  Administered 2017-03-11 (×3): 7.9 mg via INTRAVENOUS

## 2017-03-11 MED ORDER — MIDAZOLAM HCL 2 MG/2ML IJ SOLN
INTRAMUSCULAR | Status: AC
  Filled 2017-03-11: qty 2

## 2017-03-11 MED ORDER — PROPOFOL 500 MG/50ML IV EMUL
INTRAVENOUS | Status: DC | PRN
  Administered 2017-03-11: 150 ug/kg/min via INTRAVENOUS

## 2017-03-11 MED ORDER — LACTATED RINGERS IV SOLN
INTRAVENOUS | Status: DC
  Administered 2017-03-11: 09:00:00 via INTRAVENOUS

## 2017-03-11 MED ORDER — FENTANYL CITRATE (PF) 100 MCG/2ML IJ SOLN
25.0000 ug | Freq: Once | INTRAMUSCULAR | Status: AC
  Administered 2017-03-11: 25 ug via INTRAVENOUS

## 2017-03-11 MED ORDER — CHLORHEXIDINE GLUCONATE CLOTH 2 % EX PADS: 6.0000 | MEDICATED_PAD | Freq: Once | CUTANEOUS | Status: DC

## 2017-03-11 MED ORDER — LIDOCAINE VISCOUS 2 % MT SOLN
OROMUCOSAL | Status: DC | PRN
  Administered 2017-03-11: 5 mL via OROMUCOSAL

## 2017-03-11 MED ORDER — MIDAZOLAM HCL 2 MG/2ML IJ SOLN
1.0000 mg | INTRAMUSCULAR | Status: AC
  Administered 2017-03-11: 2 mg via INTRAVENOUS

## 2017-03-11 MED ORDER — PROPOFOL 10 MG/ML IV BOLUS
INTRAVENOUS | Status: AC
  Filled 2017-03-11: qty 40

## 2017-03-11 NOTE — Anesthesia Procedure Notes (Signed)
Procedure Name: MAC Date/Time: 03/11/2017 9:46 AM Performed by: Vista Deck, CRNA Pre-anesthesia Checklist: Patient identified, Emergency Drugs available, Suction available, Timeout performed and Patient being monitored Patient Re-evaluated:Patient Re-evaluated prior to induction Oxygen Delivery Method: Non-rebreather mask

## 2017-03-11 NOTE — Op Note (Signed)
[facility] Patient Name: Candace Lewis Procedure Date: 03/11/2017 9:20 AM MRN: [id number] Date of Birth: 1940/07/16 Attending MD: Norvel Richards , MD CSN: [id number] Age: 76 Admit Type: Outpatient Procedure:                Upper GI endoscopy Indications:              Dysphagia Providers:                Norvel Richards, MD, Jeanann Lewandowsky. Sharon Seller, RN,                            Aram Candela Referring MD:              Medicines:                Propofol per Anesthesia Complications:            No immediate complications. Estimated Blood Loss:     Estimated blood loss was minimal. Procedure:                Pre-Anesthesia Assessment:                           - Prior to the procedure, a History and Physical                            was performed, and patient medications and                            allergies were reviewed. The patient's tolerance of                            previous anesthesia was also reviewed. The risks                            and benefits of the procedure and the sedation                            options and risks were discussed with the patient.                            All questions were answered, and informed consent                            was obtained. Prior Anticoagulants: The patient has                            taken no previous anticoagulant or antiplatelet                            agents. ASA Grade Assessment: II - A patient with                            mild systemic disease. After reviewing the risks  and benefits, the patient was deemed in                            satisfactory condition to undergo the procedure.                           After obtaining informed consent, the endoscope was                            passed under direct vision. Throughout the                            procedure, the patient's blood pressure, pulse, and                            oxygen saturations were  monitored continuously. The                            [id number] ([id number]) scope was introduced through the                            and advanced to the second part of duodenum. The                            upper GI endoscopy was accomplished without                            difficulty. The patient tolerated the procedure                            well. 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 nicely dilated with       passage of the adult scope. the remainder of he esophagus appeared       normal.      A small hiatal hernia was present. Otherwise, normal-appearing stomach       aside from PEG site scar.      The duodenal bulb and second portion of the duodenum were normal. Impression:               - Web in the upper third of the esophagus. dilated                            with scope passage                           - Small hiatal hernia.                           - Normal duodenal bulb and second portion of the                            duodenum.                           -  No specimens collected. Moderate Sedation:      Moderate (conscious) sedation was personally administered by an       anesthesia professional. The following parameters were monitored: oxygen       saturation, heart rate, blood pressure, respiratory rate, EKG, adequacy       of pulmonary ventilation, and response to care. Total physician       intraservice time was 10 minutes. Recommendation:           - Patient has a contact number available for                            emergencies. The signs and symptoms of potential                            delayed complications were discussed with the                            patient. Return to normal activities tomorrow.                            Written discharge instructions were provided to the                            patient.                            - Mechanical soft diet.                           - Continue present medications. Continue omeprazole                            20 mg daily. Carafate suspension 1 g 4 times a day                            x 5 days                           - Return to my office in 6 weeks. Procedure Code(s):        --- Professional ---                           [phone], Esophagogastroduodenoscopy, flexible,                            transoral; diagnostic, including collection of                            specimen(s) by brushing or washing, when performed                            (separate procedure) Diagnosis Code(s):        --- Professional ---                           Q39.4, Esophageal web  K44.9, Diaphragmatic hernia without obstruction or                            gangrene                           R13.10, Dysphagia, unspecified CPT copyright 2016 American Medical Association. All rights reserved. The codes documented in this report are preliminary and upon coder review may  be revised to meet current compliance requirements. Cristopher Estimable. Briteny Fulghum, MD Norvel Richards, MD 03/11/2017 10:13:08 AM This report has been signed electronically. Number of Addenda: 0

## 2017-03-11 NOTE — Anesthesia Preprocedure Evaluation (Signed)
Anesthesia Evaluation  Patient identified by MRN, date of birth, ID band Patient awake    Reviewed: Allergy & Precautions, NPO status , Patient's Chart, lab work & pertinent test results  Airway Mallampati: II  TM Distance: >3 FB Neck ROM: Full    Dental no notable dental hx.    Pulmonary neg pulmonary ROS, former smoker,    Pulmonary exam normal breath sounds clear to auscultation       Cardiovascular hypertension, Pt. on medications Normal cardiovascular exam Rhythm:Regular Rate:Normal     Neuro/Psych  Headaches, negative psych ROS   GI/Hepatic Neg liver ROS, GERD  Medicated,  Endo/Other  negative endocrine ROSHypothyroidism   Renal/GU negative Renal ROS     Musculoskeletal  (+) Arthritis ,   Abdominal   Peds  Hematology negative hematology ROS (+)   Anesthesia Other Findings   Reproductive/Obstetrics negative OB ROS                             Anesthesia Physical Anesthesia Plan  ASA: III  Anesthesia Plan: MAC   Post-op Pain Management:    Induction: Intravenous  PONV Risk Score and Plan:   Airway Management Planned: Simple Face Mask  Additional Equipment:   Intra-op Plan:   Post-operative Plan:   Informed Consent: I have reviewed the patients History and Physical, chart, labs and discussed the procedure including the risks, benefits and alternatives for the proposed anesthesia with the patient or authorized representative who has indicated his/her understanding and acceptance.     Plan Discussed with:   Anesthesia Plan Comments:         Anesthesia Quick Evaluation

## 2017-03-11 NOTE — Telephone Encounter (Signed)
Candace Lewis called for clarity of Carafate directions. Rx Carafate suspension 1 gm 4 times a day x 5 days per Dr. Ricard Dillon.

## 2017-03-11 NOTE — H&P (Signed)
@LOGO @   Primary Care Physician:  Octavio Graves, DO Primary Gastroenterologist:  Dr. Gala Romney  Pre-Procedure History & Physical: HPI:  Candace Lewis is a 76 y.o. female here for further evaluation of esophageal dysphagia via EGD. Continue to have esophageal dysphagia. According to recent ENT evaluation, good response to radiation.  Past Medical History:  Diagnosis Date  . Arthritis    lower back  . Constipation   . Diverticulitis   . Ectopic pregnancy 1977  . 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 GERD (gastroesophageal reflux disease)   . Headache    migraines  . Hypertension    no longer taking meds  . Hypokalemia 2015  . Hypothyroidism   . Lung nodule, multiple 01/08/2015  . Macular degeneration of right eye   . 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 Thyroid disease     Past Surgical History:  Procedure Laterality Date  . BIOPSY PHARYNX  2015  . BREAST BIOPSY Bilateral    benign  . CHOLECYSTECTOMY  2005  . ESOPHAGOGASTRODUODENOSCOPY  06/19/2014   Dr. Britta Mccreedy: LA Class B esophagitis. PEG tube in place. erythema in proximal esophagus.   Marland Kitchen MASTECTOMY, PARTIAL Left 1984   no cancer, fibrocystic breast disease  . SKIN SURGERY  1960   moles removed from various areas per patient report  . TONSILLECTOMY  1950   per patient report  . TUBAL LIGATION  1976  . VAGINAL HYSTERECTOMY  1981   vaginal hyst per patient report    Prior to Admission medications   Medication Sig Start Date End Date Taking? Authorizing Provider  acetaminophen-codeine (TYLENOL #3) 300-30 MG tablet Take 1 tablet by mouth every 6 (six) hours as needed for moderate pain.   Yes [provider]  docusate sodium (COLACE) 100 MG capsule Take 100 mg by mouth at bedtime.    Yes [provider]  levothyroxine (SYNTHROID) 75 MCG tablet Take 1 tablet (75 mcg total) by mouth daily before breakfast. 01/19/17  Yes Twana First, MD  omeprazole (PRILOSEC) 20 MG  capsule TAKE 1 CAPSULE BY MOUTH ONCE DAILY 12/09/16  Yes Holley Bouche, NP  ondansetron (ZOFRAN-ODT) 8 MG disintegrating tablet Take 8 mg by mouth 3 (three) times daily. At 0700, 1500, and 2300 02/27/16  Yes [provider]  promethazine (PHENERGAN) 25 MG tablet TAKE 1 TABLET BY MOUTH EVERY 6 HOURS AS NEEDED FOR NAUSEA AND VOMITING 12/09/16  Yes Holley Bouche, NP    Allergies as of 02/15/2017 - Review Complete 01/13/2017  Allergen Reaction Noted  . Adhesive [tape] Rash 10/16/2015  . Latex Rash 10/16/2015    No family history on file.  Social History   Socioeconomic History  . Marital status: Married    Spouse name: Not on file  . Number of children: Not on file  . Years of education: Not on file  . Highest education level: Not on file  Social Needs  . Financial resource strain: Not on file  . Food insecurity - worry: Not on file  . Food insecurity - inability: Not on file  . Transportation needs - medical: Not on file  . Transportation needs - non-medical: Not on file  Occupational History  . Not on file  Tobacco Use  . Smoking status: Former Smoker    Packs/day: 1.00    Years: 23.00    Pack years: 23.00    Types: Cigarettes    Last attempt to quit: 05/03/1982  Years since quitting: 34.8  . Smokeless tobacco: Never Used  Substance and Sexual Activity  . Alcohol use: No    Alcohol/week: 0.0 oz  . Drug use: No  . Sexual activity: No    Birth control/protection: None  Other Topics Concern  . Not on file  Social History Narrative  . Not on file    Review of Systems: See HPI, otherwise negative ROS  Physical Exam: BP 135/80   Pulse 91   Temp 98.4 F (36.9 C) (Oral)   Resp 18   SpO2 99%  General:   Alert,  Well-developed, well-nourished, pleasant and cooperative in NAD Neck:  Supple; no masses or thyromegaly. No significant cervical adenopathy. Lungs:  Clear throughout to auscultation.   No wheezes, crackles, or rhonchi. No acute  distress. Heart:  Regular rate and rhythm; no murmurs, clicks, rubs,  or gallops. Abdomen: Non-distended, normal bowel sounds.  Soft and nontender without appreciable mass or hepatosplenomegaly.  Pulses:  Normal pulses noted. Extremities:  Without clubbing or edema.  Impression:  Pleasant 76 year old lady status post radiation treatment for head and neck cancer persisting esophageal dysphagia. Abnormal barium esophagram previously. Some intermittent nausea and vomiting. Agree with need for endoscopic evaluation with intervention as feasible/appropriate.  Recommendations:  I have offered the Pt an EGD/ED.  I've  discussed the situation at length with the patient and husband. We'll approach esophageal stricture conservatively and will contemplate dilation conservatively as appropriate. We discussed the risks of perforation at some length. Also, the potential need for serial dilations was reviewed. Questions answered all parties agreeable. Further recommendations to follow.         Notice: This dictation was prepared with Dragon dictation along with smaller phrase technology. Any transcriptional errors that result from this process are unintentional and may not be corrected upon review.

## 2017-03-11 NOTE — Discharge Instructions (Signed)
Oral Thrush, Adult Oral thrush is an infection in your mouth and throat. It causes white patches on your tongue and in your mouth. Follow these instructions at home: Helping with soreness  To lessen your pain: ? Drink cold liquids, like water and iced tea. ? Eat frozen ice pops or frozen juices. ? Eat foods that are easy to swallow, like gelatin and ice cream. ? Drink from a straw if the patches in your mouth are painful. General instructions   Take or use over-the-counter and prescription medicines only as told by your doctor. Medicine for oral thrush may be something to swallow, or it may be something to put on the infected area.  Eat plain yogurt that has live cultures in it. Read the label to make sure.  If you wear dentures: ? Take out your dentures before you go to bed. ? Brush them well. ? Soak them in a denture cleaner.  Rinse your mouth with warm salt-water many times a day. To make the salt-water mixture, completely dissolve 1/2-1 teaspoon of salt in 1 cup of warm water. Contact a doctor if:  Your problems are getting worse.  Your problems do not get better in less than 7 days with treatment.  Your infection is spreading. This may show as white patches on the skin outside of your mouth.  You are nursing your baby and you have redness and pain in the nipples. This information is not intended to replace advice given to you by your health care provider. Make sure you discuss any questions you have with your health care provider. Document Released: 07/15/2009 Document Revised: 01/13/2016 Document Reviewed: 01/13/2016 Elsevier Interactive Patient Education  2017 [place]. EGD Discharge instructions Please read the instructions outlined below and refer to this sheet in the next few weeks. These discharge instructions provide you with general information on caring for yourself after you leave the hospital. Your doctor may also give you specific instructions. While your  treatment has been planned according to the most current medical practices available, unavoidable complications occasionally occur. If you have any problems or questions after discharge, please call your doctor. ACTIVITY  You may resume your regular activity but move at a slower pace for the next 24 hours.   Take frequent rest periods for the next 24 hours.   Walking will help expel (get rid of) the air and reduce the bloated feeling in your abdomen.   No driving for 24 hours (because of the anesthesia (medicine) used during the test).   You may shower.   Do not sign any important legal documents or operate any machinery for 24 hours (because of the anesthesia used during the test).  NUTRITION  Drink plenty of fluids.   You may resume your normal diet.   Begin with a light meal and progress to your normal diet.   Avoid alcoholic beverages for 24 hours or as instructed by your caregiver.  MEDICATIONS  You may resume your normal medications unless your caregiver tells you otherwise.  WHAT YOU CAN EXPECT TODAY  You may experience abdominal discomfort such as a feeling of fullness or gas pains.  FOLLOW-UP  Your doctor will discuss the results of your test with you.  SEEK IMMEDIATE MEDICAL ATTENTION IF ANY OF THE FOLLOWING OCCUR:  Excessive nausea (feeling sick to your stomach) and/or vomiting.   Severe abdominal pain and distention (swelling).   Trouble swallowing.   Temperature over 101 F (37.8 C).   Rectal bleeding or vomiting of  blood.  Dysphagia Diet Level 2, Mechanically Altered The dysphagia level 2 diet includes foods that are blended, chopped, ground, or mashed so they are easier to chew and swallow. The foods are soft, moist, and can be chopped into -inch chunks (such as pancakes, pasta, and bananas). In order to be on this diet, you must be able to chew. This diet helps you transition between the pureed textures of the dysphagia level 1 diet to more solid  textures. This diet is helpful for people with mild to moderate swallowing difficulties. It reduces the risk of food getting caught in the windpipe, trachea, or lungs. You may need help or supervision during meals while following this diet so that you eat safely. You will be on this diet until your health care provider advances the texture of your diet. What do I need to know about this diet? Foods  You may eat foods that are soft and moist. ? You may need to use a blender, whisk, or masher to soften some of your foods. ? You can moisten foods with gravies, sauces, vegetable or fruit juice, milk, half and half, or water when blending, mashing, or grinding your foods to the right consistency.  If you were on the dysphagia level 1 diet, you may still eat any of the foods included in that diet.  Avoid foods that are dry, hard, sticky, chewy, coarse, and crunchy. Also avoid large cuts of food.  Take small bites. Each bite should contain  inch or less of food. Liquids  Avoid liquids with seeds and chunks.  Thicken liquids, if instructed by your health care provider. Your health care provider will tell you the consistency to which you should thicken your liquids for safe swallowing. To thicken a liquid, use a commercial thickener or a thickening food (such as rice cereal or potato flakes). Ask your health care provider for specific recommendations on thickeners. See your dietitian or health care provider regularly for help with your dietary changes. What foods can I eat? Grains Store-bought soft breads that do not have nuts or seeds. Pancakes, sweet rolls, [place] pastries, and [place] toast that have been moistened with syrup or sauce to form a slurry when blended. Well-cooked pasta, noodles, and bread dressing. Well-cooked noodles and pasta in sauce. Moist macaroni and cheese. Soft dumplings or spaetzle with gravy or butter. Cooked cereals (including oatmeal). Low-texture dry cereals, such as rice  puff, corn, or wheat-flake cereals, with milk (if thin liquids are not allowed, make sure all of the milk is absorbed by the cereal before eating it). Vegetables Very soft, well-cooked vegetables in pieces less than  inch in size. Cooked potatoes that are moist, not crispy, and with sauce. Fruits Canned or cooked fruits that are soft or moist and do not have skin or seeds. Fresh, soft bananas. Fruit juices with a small amount of pulp (if thin liquids are allowed). Gelatin or plain gelatin with canned fruit, except pineapple. Meat and Other Protein Sources Tender, moist meats, poultry, or fish cooked with gravy or sauce and cubed to -inch bites or smaller. Ground meat. Moist meatball or meatloaf. Fish without bones. Moist casseroles without rice. Tuna, egg, or meat salad without chunks or hard-to-chew vegetables, such as celery and onions. Smooth quiche without large chunks. Scrambled, poached, or soft-cooked eggs with butter, margarine, sauce, or gravy. Tofu. Well-cooked, moistened and mashed beans, peas, baked beans, and other legumes. Casseroles without rice (such as tuna noodle casserole or soft moist meat lasagna). Dairy Cream  cheese. Yogurt. Cottage cheese. Ask your health care provider if milk is allowed. Sweets/Desserts Pudding. Custard. Soft fruit pies with crust on the bottom only. Crisps and cobblers without seeds or nuts and with soft crusts. Soft, moist cakes. Icing. Pre-gelled cookies. Soft, moist cookies dunked in milk, coffee, or another liquid. Jelly. Soft, smooth chocolate bars that are easily chewed. Jams and preserves without seeds. Ask your health care provider whether you can have frozen desserts. Fats and Oils Butter. Margarine. Cream for cereal, depending on liquid consistency allowed. Gravy. Cream sauces. Mayonnaise. Salad dressings. Cream cheese. Cheese spreads, plain or with soft fruits or vegetables added. Sour cream. Sour cream dips with soft fruits or vegetables added.  Whipped toppings. Other Sauces and salsas that have soft chunks that are about  inch or smaller. The items listed above may not be a complete list of recommended foods or beverages. Contact your dietitian for more options. What foods are not recommended? Grains All breads not listed in the recommended list. Breads that are hard or have nuts or seeds. Coarse cereals. Cereals that have nuts, seeds, dried fruits, or coconut. Rice. Corn. Vegetables Whole, raw, frozen, or dried vegetables. Tough, fibrous, chewy, or stringy cooked vegetables, such as celery, peas, broccoli, cabbage, Brussels sprouts, and asparagus. Potato skins. Potato and other vegetable chips. Fried or French-fried potatoes. Cooked corn and peas. Fruits Whole raw, frozen, or dried fruits, including coconut. Pineapple. Fruits with seeds. Meat and Other Protein Sources Dry, tough meats, such as bacon, sausage, and hot dogs. Cheese slices and cubes. Peanut butter. Hard boiled or fried eggs. Nuts. Seeds. Pizza. Sandwiches. Dry casseroles or casseroles with rice or large chunks. Dairy Yogurt with nuts, seeds, or large chunks. Sweets/Desserts Coarse, hard, chewy, or sticky desserts. Any dessert with nuts, seeds, coconut, pineapple, or dried fruit. Ask your health care provider whether you can have frozen desserts. Fats and Oils Avoid fats with chunky, large textures, such as those with nuts or fruits. Other Soups and casseroles with large chunks. The items listed above may not be a complete list of foods and beverages to avoid. Contact your dietitian for more information. This information is not intended to replace advice given to you by your health care provider. Make sure you discuss any questions you have with your health care provider. Document Released: 04/20/2005 Document Revised: 09/26/2015 Document Reviewed: 04/03/2013 Elsevier Interactive Patient Education  2018 [place] with omeprazole 20 mg  daily  Add Carafate 1 g 4 times a day 5 days  Information on a dysphagia 2 diet  You may or may not need her esophagus dilated again in the future  Office visit with [place] in 6 weeks

## 2017-03-11 NOTE — Anesthesia Postprocedure Evaluation (Signed)
Anesthesia Post Note  Patient: Candace Lewis  Procedure(s) Performed: ESOPHAGOGASTRODUODENOSCOPY (EGD) WITH PROPOFOL (N/A )  Patient location during evaluation: PACU Anesthesia Type: MAC Level of consciousness: awake and alert, oriented and patient cooperative Pain management: pain level controlled Vital Signs Assessment: post-procedure vital signs reviewed and stable Respiratory status: spontaneous breathing and respiratory function stable Cardiovascular status: stable Postop Assessment: no apparent nausea or vomiting Anesthetic complications: no     Last Vitals:  Vitals:   03/11/17 0901  BP: 135/80  Pulse: 91  Resp: 18  Temp: 36.9 C  SpO2: 99%    Last Pain:  Vitals:   03/11/17 0901  TempSrc: Oral                 ADAMS, AMY A

## 2017-03-11 NOTE — Transfer of Care (Signed)
Immediate Anesthesia Transfer of Care Note  Patient: Candace Lewis  Procedure(s) Performed: ESOPHAGOGASTRODUODENOSCOPY (EGD) WITH PROPOFOL (N/A )  Patient Location: PACU  Anesthesia Type:MAC  Level of Consciousness: awake, alert , oriented and patient cooperative  Airway & Oxygen Therapy: Patient Spontanous Breathing  Post-op Assessment: Report given to RN and Post -op Vital signs reviewed and stable  Post vital signs: Reviewed and stable  Last Vitals:  Vitals:   03/11/17 0901  BP: 135/80  Pulse: 91  Resp: 18  Temp: 36.9 C  SpO2: 99%    Last Pain:  Vitals:   03/11/17 0901  TempSrc: Oral      Patients Stated Pain Goal: 4 (45/80/99 8338)  Complications: No apparent anesthesia complications

## 2017-03-16 ENCOUNTER — Encounter (HOSPITAL_COMMUNITY): Payer: Self-pay | Admitting: Internal Medicine

## 2017-03-18 NOTE — Progress Notes (Signed)
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      01/19/2014 PET scan    Tongue mass with 2 level II Right neck nodes T2N2b, Stage IVA      02/08/2014 - 04/03/2014 Radiation Therapy    Initiation of radiation therapy with 70 Gy delivered to the right base of tongue/tonsil 63 Gy to high risk nodal echelons, 56 Gy to the intermediate r base of tongue/tonsil/bilateral neck nodes      02/08/2014 - 03/15/2014 Chemotherapy    weekly cisplatin, only 4 doses given, severe toxicity with neutropenia, dehydration, nausea, vomiting, hospitalization and obstipation      10/24/2015 PET scan    low level non malig range hyper metab in L apical sub solid pulm nodule, suspicious for low grade adeno. diffuse tongue hypermetab without CT correlation      10/28/2015 Procedure    Bronchoscopy navigation, electromagnetic navigation, bronchoscopy with biopsy of LUL lung lesion, placement of fiducial markers X 3      10/28/2015 Pathology Results    Scant benign lung tissue, no tumor seen         INTERVAL HISTORY:  Candace Lewis 76 y.o. female returns for follow-up for history of Stage IVA tongue cancer and LUL lung nodule (biopsied and negative for malignancy in 10/2015).   Chart reviewed. Since her last visit to the cancer center, she underwent EGD on 03/11/17 with Dr. Gala Romney for persistent dysphagia and N&V.  EGD report reviewed - "web in upper third of esophagus was dilated with scope passage; small hiatal hernia; normal duodenal bulb and second portion of duodenum; no  specimens were collected. Recommend continue omeprazole 20 mg daily and Carafate 1 gm 4 times per day x 5 days. Return to my office in 6 weeks"   Here today unaccompanied. Reports having "no appetite at all." Energy levels 75%.  Since her recent esophageal dilation, she feels like her swallowing/dysphagia is improving. She continues to have chronic issues with N&V, although her vomiting has improved as well.  Her weight has largely remained stable for the past few months.  Continues to struggle with xerostomia.  States she was recently treated for oral thrush; she doesn't think the coating on her tongue has gotten much better since that time. Denies any tongue pain. States that she has also noticed "when I stick my tongue out, it goes to the right."  States that she hadn't really noticed that before, "but when I stick my tongue out I feel a tightness in the back of my throat/neck and my tongue just goes to the right side of my mouth."    Her biggest complaint today is new, right temporal/cheek/neck pain. It started on Mon, 03/15/17. States that she has been taking the Tylenol #3 "and it's not touching the pain."  Describes the pain as a dull ache "that hits my temple kind of like a contraction would, and it feels like there's a vice on my temple at times."  The pain radiates into the (R) ear, down the right jaw,  and into the right upper neck.  Denies any headaches, dizziness, vision changes, numbness, or other neurologic complaints.  Currently rates her pain 3/10, but states that it does get more severe at times .  She last saw Dr. Servando Snare with cardiothoracic surgery in 02/2017; she had CT chest at that time that showed stable nodule. She tells me she is supposed to see him again sometime in the Spring for   Last saw Dr. Benjamine Mola with ENT in 01/2017; states he performed laryngoscopy and "he told me I had some swelling, but otherwise things looked okay."       REVIEW OF SYSTEMS:  Review of Systems    Constitutional: Positive for appetite change and fatigue. Negative for chills, fever and unexpected weight change.  HENT:   Positive for trouble swallowing. Negative for lump/mass.   Eyes: Negative.   Respiratory: Negative.  Negative for cough and shortness of breath.   Cardiovascular: Negative.   Gastrointestinal: Positive for constipation, nausea and vomiting.  Endocrine: Negative.   Genitourinary: Negative.    Skin: Negative.   Neurological: Negative.  Negative for dizziness, headaches and numbness.  Hematological: Negative.   Psychiatric/Behavioral: Negative.      PAST MEDICAL/SURGICAL HISTORY:  Past Medical History:  Diagnosis Date  . Arthritis    lower back  . Constipation   . Diverticulitis   . Ectopic pregnancy 1977  . Esophageal cancer ([facility]) 2015   tongue cancer, throat cancer  . GERD (gastroesophageal reflux disease)   . Headache    migraines  . Hypertension    no longer taking meds  . Hypokalemia 2015  . Hypothyroidism   . Lung nodule, multiple 01/08/2015  . Macular degeneration of right eye   . Malignant neoplasm of tongue ([place]) 01/24/2014  . Thyroid disease    Past Surgical History:  Procedure Laterality Date  . BIOPSY PHARYNX  2015  . BREAST BIOPSY Bilateral    benign  . CHOLECYSTECTOMY  2005  . ESOPHAGOGASTRODUODENOSCOPY  06/19/2014   Dr. Britta Mccreedy: LA Class B esophagitis. PEG tube in place. erythema in proximal esophagus.   . ESOPHAGOGASTRODUODENOSCOPY (EGD) WITH PROPOFOL N/A 03/11/2017   Performed by Daneil Dolin, MD at [facility]  . MASTECTOMY, PARTIAL Left 1984   no cancer, fibrocystic breast disease  . PLACEMENT OF FUDUCIAL TIMES THREE; LEFT UPPER LOBE BIOPSY N/A 10/28/2015   Performed by Grace Isaac, MD at [facility]  . SKIN SURGERY  1960   moles removed from various areas per patient report  . TONSILLECTOMY  1950   per patient report  . TUBAL LIGATION  1976  . VAGINAL HYSTERECTOMY  1981   vaginal hyst per patient report  . VIDEO  BRONCHOSCOPY WITH ENDOBRONCHIAL NAVIGATION N/A 10/28/2015   Performed by Grace Isaac, MD at [facility]:  Social History   Socioeconomic History  . Marital status: Married    Spouse name: Not on file  . Number of children: Not on file  . Years of education: Not on file  . Highest education level: Not on file  Social Needs  . Financial resource strain: Not on file  . Food insecurity - worry: Not on file  . Food insecurity - inability: Not on file  . Transportation needs - medical: Not on file  . Transportation needs - non-medical: Not on file  Occupational History  . Not on file  Tobacco Use  . Smoking status: Former Smoker    Packs/day: 1.00  Years: 23.00    Pack years: 23.00    Types: Cigarettes    Last attempt to quit: 05/03/1982    Years since quitting: 34.9  . Smokeless tobacco: Never Used  Substance and Sexual Activity  . Alcohol use: No    Alcohol/week: 0.0 oz  . Drug use: No  . Sexual activity: No    Birth control/protection: None  Other Topics Concern  . Not on file  Social History Narrative  . Not on file    FAMILY HISTORY:  History reviewed. No pertinent family history.  CURRENT MEDICATIONS:  Outpatient Encounter Medications as of 03/19/2017  Medication Sig  . acetaminophen-codeine (TYLENOL #3) 300-30 MG tablet Take 1 tablet by mouth every 6 (six) hours as needed for moderate pain.  Marland Kitchen docusate sodium (COLACE) 100 MG capsule Take 100 mg by mouth at bedtime.   Marland Kitchen omeprazole (PRILOSEC) 20 MG capsule TAKE 1 CAPSULE BY MOUTH ONCE DAILY  . ondansetron (ZOFRAN-ODT) 8 MG disintegrating tablet Take 8 mg by mouth 3 (three) times daily. At 0700, 1500, and 2300  . promethazine (PHENERGAN) 25 MG tablet TAKE 1 TABLET BY MOUTH EVERY 6 HOURS AS NEEDED FOR NAUSEA AND VOMITING  . [DISCONTINUED] levothyroxine (SYNTHROID) 75 MCG tablet Take 1 tablet (75 mcg total) by mouth daily before breakfast.  . levothyroxine (SYNTHROID) 88 MCG tablet Take 1 tablet  (88 mcg total) daily before breakfast by mouth.  . traMADol (ULTRAM) 50 MG tablet Take 1 tablet (50 mg total) every 6 (six) hours as needed by mouth.   No facility-administered encounter medications on file as of 03/19/2017.     ALLERGIES:  Allergies  Allergen Reactions  . Adhesive [Tape] Rash    Reports rash and blistering if any adhesive tape  . Latex Rash    Rash and skin blistering per patient report     PHYSICAL EXAM:  ECOG Performance status: 1 - Symptomatic, but independent.   Vitals:   03/19/17 1336  BP: 124/72  Pulse: 91  Resp: 18  Temp: 98.4 F (36.9 C)  SpO2: 98%   Filed Weights   03/19/17 1336  Weight: 175 lb 4.8 oz (79.5 kg)    Physical Exam  Constitutional: She is oriented to person, place, and time.  HENT:  Head: Normocephalic.  Mouth/Throat: No oropharyngeal exudate.  -Xerostomia appreciated with thick/ropey saliva.   -Tongue white-coated; possibly concerning for thrush vs radiation changes associated with xerostomia.  -Mild pain to (R) temporal/jaw region when clenching jaws; worse pain with opening her mouth wide.  -(L) TM visualized and appears normal.  -Unable to visualize (R) TM d/t mild external ear edema; no apparent infection. Mild cerumen present.   Eyes: Conjunctivae are normal. Pupils are equal, round, and reactive to light. No scleral icterus.  Neck: Normal range of motion.  Anterior neck with diffuse fibrosis consistent with post-radiation changes. No palpable adenopathy to neck or supraclavicular regions.   Cardiovascular: Normal rate, regular rhythm and normal heart sounds.  Pulmonary/Chest: Effort normal and breath sounds normal. No respiratory distress.  Abdominal: Soft. Bowel sounds are normal. There is no tenderness.  Musculoskeletal: Normal range of motion. She exhibits no edema.  Lymphadenopathy:       Head (right side): No submental, no submandibular, no tonsillar and no preauricular adenopathy present.       Head (left side):  No submental, no submandibular, no tonsillar and no preauricular adenopathy present.    She has no cervical adenopathy.       Right: No supraclavicular  adenopathy present.       Left: No supraclavicular adenopathy present.  Neurological: She is alert and oriented to person, place, and time. No cranial nerve deficit. Gait normal.  -Mild tongue deviation to the (R) with associated discomfort/"tightness" to floor of mouth muscles.  -No other focal deficits appreciated on physical exam   Skin: Skin is warm and dry. No rash noted.  Psychiatric: Mood, memory, affect and judgment normal.  Nursing note and vitals reviewed.    LABORATORY DATA:  I have reviewed the labs as listed.  CBC    Component Value Date/Time   WBC 2.7 (L) 03/19/2017 1152   RBC 4.57 03/19/2017 1152   HGB 13.5 03/19/2017 1152   HCT 39.5 03/19/2017 1152   PLT 136 (L) 03/19/2017 1152   MCV 86.4 03/19/2017 1152   MCH 29.5 03/19/2017 1152   MCHC 34.2 03/19/2017 1152   RDW 13.8 03/19/2017 1152   LYMPHSABS 0.7 03/19/2017 1152   MONOABS 0.2 03/19/2017 1152   EOSABS 0.1 03/19/2017 1152   BASOSABS 0.0 03/19/2017 1152   CMP Latest Ref Rng & Units 03/19/2017 03/05/2017 11/16/2016  Glucose 65 - 99 mg/dL 96 97 115(H)  BUN 6 - 20 mg/dL 22(H) 24(H) 24(H)  Creatinine 0.44 - 1.00 mg/dL 0.98 0.92 0.98  Sodium 135 - 145 mmol/L 137 138 138  Potassium 3.5 - 5.1 mmol/L 3.3(L) 3.6 3.9  Chloride 101 - 111 mmol/L 102 102 102  CO2 22 - 32 mmol/L 29 27 28   Calcium 8.9 - 10.3 mg/dL 9.2 9.5 9.7  Total Protein 6.5 - 8.1 g/dL 6.7 - -  Total Bilirubin 0.3 - 1.2 mg/dL 0.7 - -  Alkaline Phos 38 - 126 U/L 72 - -  AST 15 - 41 U/L 24 - -  ALT 14 - 54 U/L 16 - -   Results for Candace, Lewis (MRN [id number])   Ref. Range 07/17/2016 13:50  TSH Latest Ref Range: 0.350 - 4.500 uIU/mL 3.949   Results for Candace, Lewis (MRN [id number])  Ref. Range 03/19/2017 11:52  TSH Latest Ref Range: 0.350 - 4.500 uIU/mL 6.487 (H)   PENDING LABS:     DIAGNOSTIC IMAGING:  CT chest: 02/18/17 CLINICAL DATA:  Pulmonary nodule.  History of throat cancer.  EXAM: CT CHEST WITHOUT CONTRAST  TECHNIQUE: Multidetector CT imaging of the chest was performed using thin slice collimation for electromagnetic bronchoscopy planning purposes, without intravenous contrast.  COMPARISON:  08/27/2016  FINDINGS: Cardiovascular: The heart size is normal. No pericardial effusion. Coronary artery calcification is evident. Atherosclerotic calcification is noted in the wall of the thoracic aorta.  Mediastinum/Nodes: Small mediastinal lymph nodes measure up to a maximum of about 9 mm short axis in the precarinal station. No evidence for gross hilar lymphadenopathy although assessment is limited by the lack of intravenous contrast on today's study. The esophagus has normal imaging features. There is no axillary lymphadenopathy.  Lungs/Pleura: Biapical pleural-parenchymal scarring is similar to prior. Similar appearance mixed attenuation irregular nodule posterior left upper lobe, measuring 2.0 x 2.2 cm today compared to 2.2 x 2.4 cm previously. 3 mm right upper lobe nodule (image 32 series 4) is stable. 3 mm ground-glass nodule in the right upper lobe on image 41 is unchanged. Left upper lobe fiducial markers are evident. No pulmonary edema or pleural effusion.  Upper Abdomen: Unremarkable.  Musculoskeletal: Bone windows reveal no worrisome lytic or sclerotic osseous lesions.  IMPRESSION: 1. Similar appearance mixed attenuation left upper lobe pulmonary lesion. 2. Exam also otherwise stable  with no new or progressive findings.   Electronically Signed   By: Misty Stanley M.D.   On: 02/18/2017 15:09     PATHOLOGY:  (R) tonsil/tongue base biopsy: 01/02/14 [facility])    LUL lung biopsy: 10/28/15               ASSESSMENT & PLAN:   Stage IVA squamous cell carcinoma of tongue:  -Diagnosed 01/2014;  treated with concurrent chemoradiation; completed treatment on 04/03/14.  -No clinical evidence of recurrence on physical exam today.  However, her new (R) temporal/facial/neck pain is concerning. (see below).  If her work-up with imaging is negative, then she will return for routine follow-up at the cancer center in 4 months.  We will contact her with the results of her scans.   -Maintain follow-up with ENT for flexible laryngoscopy as directed.    New (R) temporal/facial/neck pain:  -Present x 5 days. Not controlled with Tylenol #3 pain medication.   -Differential diagnoses include: cranial nerve recurrence of her head & neck cancer causing impingement vs TMJ vs right ear dysfunction or other benign cause.   -Discussed with Dr. Talbert Cage.  While I have low clinical suspicion for recurrence of her tongue cancer now 3 years post-treatment, it is still prudent to evaluate new/concerning symptoms with imaging.  Will obtain CT maxillofacial/sinus, with particular focus to the right side, as well as CT soft tissue neck with contrast to rule out recurrent/metastatic disease.  We will contact her with there results of her scans.   -She is not interested in "anything too strong" in terms of pain medications for this new pain.  She agreed to try Tramadol to see if this offers her more adequate relief until we are able to further evaluate the cause of this pain.  Paper prescription for Tramadol 50 mg given to patient today; I reviewed possible side effects with her. Encouraged her to be particularly mindful of her bowel function while take opiate medications.  She voiced understanding.   Hypothyroidism:  -Likely secondary to chemoradiation to the neck.  She reports being compliant with taking her current dose (75 mcg) of Synthroid on an empty stomach without other medications.   -TSH elevated at 6.487 today.  Therefore, will increase her Synthroid to 88 mcg daily.  New prescription e-scribed to her pharmacy.  -Will  have her return for labs only in ~2 months to recheck her thyroid function. (TSH, T3, and free T4)  Lung nodule:  -Saw Dr. Servando Snare in 08/2016; he notes that she remains apprehensive to have any invasive procedures done, particularly in light of her significant dysphagia.  He recommended repeat CT chest in 02/2017; results revealed similar appearing mixed attentuation LUL lesion. Dr. Servando Snare saw patient again in 02/2017 and she again remains apprehensive to do invasive procedure for tissue diagnosis.  She agreed to follow-up CT scan in 6 months.   -Continue follow-up with Dr. Servando Snare as directed.        Dispo:  -CT scans as soon as able per above. We will contact her with the results and bring her in sooner if needed.  -Labs only in 2 months to re-check thyroid function; orders placed today. (TSH, T3, free T4) -Return to cancer center in 4 months for continued surveillance with labs. (CBC with diff, CMET, TSH, T3, and free T4)   All questions were answered to patient's stated satisfaction. Encouraged patient to call with any new concerns or questions before her next visit to the cancer center and  we can certain see her sooner, if needed.      Orders placed this encounter:  Orders Placed This Encounter  Procedures  . CT MAXILLOFACIAL W & WO CONTRAST  . CT SOFT TISSUE NECK W WO CONTRAST  . TSH  . T3  . T4, free  . CBC with Differential/Platelet  . Comprehensive metabolic panel      Mike Craze, NP [place] [phone]

## 2017-03-19 ENCOUNTER — Encounter (HOSPITAL_COMMUNITY): Payer: Self-pay | Admitting: Adult Health

## 2017-03-19 ENCOUNTER — Encounter (HOSPITAL_COMMUNITY): Payer: Medicare Other

## 2017-03-19 ENCOUNTER — Encounter (HOSPITAL_COMMUNITY): Payer: Medicare Other | Attending: Adult Health | Admitting: Adult Health

## 2017-03-19 ENCOUNTER — Other Ambulatory Visit: Payer: Self-pay

## 2017-03-19 VITALS — BP 124/72 | HR 91 | Temp 98.4°F | Resp 18 | Ht 67.5 in | Wt 175.3 lb

## 2017-03-19 DIAGNOSIS — R51 Headache: Secondary | ICD-10-CM

## 2017-03-19 DIAGNOSIS — C029 Malignant neoplasm of tongue, unspecified: Secondary | ICD-10-CM

## 2017-03-19 DIAGNOSIS — R911 Solitary pulmonary nodule: Secondary | ICD-10-CM

## 2017-03-19 DIAGNOSIS — M542 Cervicalgia: Secondary | ICD-10-CM

## 2017-03-19 DIAGNOSIS — E039 Hypothyroidism, unspecified: Secondary | ICD-10-CM

## 2017-03-19 DIAGNOSIS — R519 Headache, unspecified: Secondary | ICD-10-CM

## 2017-03-19 LAB — COMPREHENSIVE METABOLIC PANEL
ALT: 16 U/L (ref 14–54)
AST: 24 U/L (ref 15–41)
Albumin: 3.7 g/dL (ref 3.5–5.0)
Alkaline Phosphatase: 72 U/L (ref 38–126)
Anion gap: 6 (ref 5–15)
BUN: 22 mg/dL — ABNORMAL HIGH (ref 6–20)
CO2: 29 mmol/L (ref 22–32)
Calcium: 9.2 mg/dL (ref 8.9–10.3)
Chloride: 102 mmol/L (ref 101–111)
Creatinine, Ser: 0.98 mg/dL (ref 0.44–1.00)
GFR calc Af Amer: >60 mL/min (ref >60)
GFR calc non Af Amer: 55 mL/min — ABNORMAL LOW (ref >60)
Glucose, Bld: 96 mg/dL (ref 65–99)
Potassium: 3.3 mmol/L — ABNORMAL LOW (ref 3.5–5.1)
Sodium: 137 mmol/L (ref 135–145)
Total Bilirubin: 0.7 mg/dL (ref 0.3–1.2)
Total Protein: 6.7 g/dL (ref 6.5–8.1)

## 2017-03-19 LAB — CBC WITH DIFFERENTIAL/PLATELET
Basophils Absolute: 0 10*3/uL (ref 0.0–0.1)
Basophils Relative: 0 %
Eosinophils Absolute: 0.1 10*3/uL (ref 0.0–0.7)
Eosinophils Relative: 4 %
HCT: 39.5 % (ref 36.0–46.0)
Hemoglobin: 13.5 g/dL (ref 12.0–15.0)
Lymphocytes Relative: 27 %
Lymphs Abs: 0.7 10*3/uL (ref 0.7–4.0)
MCH: 29.5 pg (ref 26.0–34.0)
MCHC: 34.2 g/dL (ref 30.0–36.0)
MCV: 86.4 fL (ref 78.0–100.0)
Monocytes Absolute: 0.2 10*3/uL (ref 0.1–1.0)
Monocytes Relative: 6 %
Neutro Abs: 1.7 10*3/uL (ref 1.7–7.7)
Neutrophils Relative %: 63 %
Platelets: 136 10*3/uL — ABNORMAL LOW (ref 150–400)
RBC: 4.57 MIL/uL (ref 3.87–5.11)
RDW: 13.8 % (ref 11.5–15.5)
WBC: 2.7 10*3/uL — ABNORMAL LOW (ref 4.0–10.5)

## 2017-03-19 LAB — TSH: TSH: 6.487 u[IU]/mL — ABNORMAL HIGH (ref 0.350–4.500)

## 2017-03-19 MED ORDER — LEVOTHYROXINE SODIUM 88 MCG PO TABS: 88.0000 ug | ORAL_TABLET | Freq: Every day | ORAL | 3 refills | Status: DC

## 2017-03-19 MED ORDER — TRAMADOL HCL 50 MG PO TABS: 50.0000 mg | ORAL_TABLET | Freq: Four times a day (QID) | ORAL | 0 refills | Status: DC | PRN

## 2017-03-19 NOTE — Patient Instructions (Signed)
[facility] at [facility] Discharge Instructions  RECOMMENDATIONS MADE BY THE CONSULTANT AND ANY TEST RESULTS WILL BE SENT TO YOUR REFERRING PHYSICIAN.  You were seen today by Mike Craze NP. Start taking new dose of Levothyroxine 41mcg daily. Return in 2 months for labs. Return in 4 months for labs and follow up.   Thank you for choosing [facility] at [facility] to provide your oncology and hematology care.  To afford each patient quality time with our provider, please arrive at least 15 minutes before your scheduled appointment time.    If you have a lab appointment with the [facility] please come in thru the  Main Entrance and check in at the main information desk  You need to re-schedule your appointment should you arrive 10 or more minutes late.  We strive to give you quality time with our providers, and arriving late affects you and other patients whose appointments are after yours.  Also, if you no show three or more times for appointments you may be dismissed from the clinic at the providers discretion.     Again, thank you for choosing [facility].  Our hope is that these requests will decrease the amount of time that you wait before being seen by our physicians.       _____________________________________________________________  Should you have questions after your visit to [facility], please contact our office at [phone] between the hours of 8:30 a.m. and 4:30 p.m.  Voicemails left after 4:30 p.m. will not be returned until the following business day.  For prescription refill requests, have your pharmacy contact our office.       Resources For Cancer Patients and their Caregivers ? American Cancer Society: Can assist with transportation, wigs, general needs, runs Look Good Feel Better.        260 279 8036 ? Cancer Care: Provides financial assistance, online support groups,  medication/co-pay assistance.  1-800-813-HOPE 6176162135) ? [facility] Assists [place] cancer patients and their families through emotional , educational and financial support.  531-885-4108 ? [place] DSS Where to apply for food stamps, Medicaid and utility assistance. 404-025-8588 ? [facility]: Transportation to medical appointments. (831)852-0505 ? Social Security Administration: May apply for disability if have a Stage IV cancer. 435 608 8189 (765)037-3598 ? [employer], Disability and Transit Services: Assists with nutrition, care and transit needs. [place] Support Programs: @10RELATIVEDAYS @ > Cancer Support Group  2nd Tuesday of the month 1pm-2pm, Journey Room  > Creative Journey  3rd Tuesday of the month 1130am-1pm, Journey Room  > Look Good Feel Better  1st Wednesday of the month 10am-12 noon, Journey Room (Call [facility] to register [phone])

## 2017-03-24 ENCOUNTER — Other Ambulatory Visit (HOSPITAL_COMMUNITY): Payer: Self-pay | Admitting: Adult Health

## 2017-03-24 DIAGNOSIS — R51 Headache: Secondary | ICD-10-CM

## 2017-03-24 DIAGNOSIS — M542 Cervicalgia: Secondary | ICD-10-CM

## 2017-03-24 DIAGNOSIS — C029 Malignant neoplasm of tongue, unspecified: Secondary | ICD-10-CM

## 2017-03-24 DIAGNOSIS — R519 Headache, unspecified: Secondary | ICD-10-CM

## 2017-03-30 ENCOUNTER — Ambulatory Visit (HOSPITAL_COMMUNITY)
Admission: RE | Admit: 2017-03-30 | Discharge: 2017-03-30 | Disposition: A | Discharge status: Home / Self Care | Payer: Medicare Other | Source: Ambulatory Visit | Attending: Adult Health | Admitting: Adult Health

## 2017-03-30 DIAGNOSIS — C029 Malignant neoplasm of tongue, unspecified: Secondary | ICD-10-CM | POA: Diagnosis present

## 2017-03-30 DIAGNOSIS — I6523 Occlusion and stenosis of bilateral carotid arteries: Secondary | ICD-10-CM | POA: Diagnosis not present

## 2017-03-30 DIAGNOSIS — M542 Cervicalgia: Secondary | ICD-10-CM

## 2017-03-30 DIAGNOSIS — R51 Headache: Secondary | ICD-10-CM | POA: Insufficient documentation

## 2017-03-30 DIAGNOSIS — R519 Headache, unspecified: Secondary | ICD-10-CM

## 2017-03-30 MED ORDER — IOPAMIDOL (ISOVUE-300) INJECTION 61%
75.0000 mL | Freq: Once | INTRAVENOUS | Status: AC | PRN
  Administered 2017-03-30: 80 mL via INTRAVENOUS

## 2017-04-05 ENCOUNTER — Other Ambulatory Visit (HOSPITAL_COMMUNITY): Payer: Self-pay | Admitting: Adult Health

## 2017-05-07 ENCOUNTER — Telehealth: Payer: Self-pay | Admitting: General Practice

## 2017-05-07 NOTE — Telephone Encounter (Signed)
I received a call from [facility] at [facility] office and she stated the patient came in to follow-up on an abnormal EKG that was noted during her preoperative appt in 03/2017 and she needed another copy of the EKG.  EKG was faxed and confirmed by [facility] that they did receive it.

## 2017-05-10 ENCOUNTER — Ambulatory Visit: Payer: Medicare Other | Admitting: Nurse Practitioner

## 2017-05-12 ENCOUNTER — Other Ambulatory Visit: Payer: Self-pay

## 2017-05-12 ENCOUNTER — Encounter: Payer: Self-pay | Admitting: Gastroenterology

## 2017-05-12 ENCOUNTER — Ambulatory Visit: Payer: Medicare Other | Admitting: Gastroenterology

## 2017-05-12 VITALS — BP 131/86 | HR 98 | Temp 97.6°F | Ht 67.5 in | Wt 169.2 lb

## 2017-05-12 DIAGNOSIS — K5900 Constipation, unspecified: Secondary | ICD-10-CM

## 2017-05-12 DIAGNOSIS — R112 Nausea with vomiting, unspecified: Secondary | ICD-10-CM

## 2017-05-12 DIAGNOSIS — R002 Palpitations: Secondary | ICD-10-CM | POA: Diagnosis not present

## 2017-05-12 DIAGNOSIS — I471 Supraventricular tachycardia: Secondary | ICD-10-CM

## 2017-05-12 NOTE — Patient Instructions (Signed)
Please have blood work done.  I have given you samples of Dexilant. Stop Prilosec. Start taking Dexilant each morning. For constipation: take Linzess 1 capsule 30 minutes before your ensure. This can cause some loose stool at first but should get better after a few days. If not, let me know.'  I am referring you to cardiology for your heart.  I have ordered the gastric emptying study. We may need to do a CT scan.   Further recommendations to follow!

## 2017-05-12 NOTE — Progress Notes (Signed)
Referring Provider: Octavio Graves, DO Primary Care Physician:  Octavio Graves, DO Primary GI: Dr. Gala Romney   Chief Complaint  Patient presents with  . Dysphagia    pp f/u. Improved  . Emesis    f/u. acid like fluid that comes back up    HPI:   Candace Lewis is a 77 y.o. 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 Completed therapy in 2015. Laryngoscopy in March negative for recurrence of disease. Chronic history of N/V. GES in past equivocal. Recent EGD completed with web in upper third of esophagus dilated with scope passage, small hiatal hernia, normal duodenum. No specimens collected.   Three days after procedure starting vomiting again. Several times a day. Had gotten Prilosec filled at [facility] and it was with a different manufacturer. Now back on the old preparation of Prilosec but still with nausea. Taking Zofran at 0700, 1500, and 11pm.   Only able to tolerate ensure. Drinks 4 cans of ensure a day. Hasn't been able to tolerate solid foods. States Reglan was not helpful in the past. Hasn't vomited in 15 days, but incredibly nauseated.   Omeprazole once daily. Phenergan makes her heart beat really fast.   Notes intermittent episodes where she feels her heart is beating fast. Doesn't feel like it skips a beat. Nausea related not to this.   Still with constipation. Down 6 lbs since last seen.     Past Medical History:  Diagnosis Date  . Arthritis    lower back  . Constipation   . Diverticulitis   . Ectopic pregnancy 1977  . Esophageal cancer ([place]) 2015   tongue cancer, throat cancer  . GERD (gastroesophageal reflux disease)   . Headache    migraines  . Hypertension    no longer taking meds  . Hypokalemia 2015  . Hypothyroidism   . Lung nodule, multiple 01/08/2015  . Macular degeneration of right eye   . Malignant neoplasm of tongue ([place]) 01/24/2014  . Thyroid disease      Past Surgical History:  Procedure Laterality Date  . BIOPSY PHARYNX  2015  . BREAST BIOPSY Bilateral    benign  . CHOLECYSTECTOMY  2005  . ESOPHAGOGASTRODUODENOSCOPY  06/19/2014   Dr. Britta Mccreedy: LA Class B esophagitis. PEG tube in place. erythema in proximal esophagus.   . ESOPHAGOGASTRODUODENOSCOPY (EGD) WITH PROPOFOL N/A 03/11/2017   Procedure: ESOPHAGOGASTRODUODENOSCOPY (EGD) WITH PROPOFOL;  Surgeon: Daneil Dolin, MD;  [facility];  Service: Endoscopy;  Laterality: N/A;  10:30AM  . FUDUCIAL PLACEMENT N/A 10/28/2015   Procedure: PLACEMENT OF FUDUCIAL TIMES THREE; LEFT UPPER LOBE BIOPSY;  Surgeon: Grace Isaac, MD;  Location: [place];  Service: Thoracic;  Laterality: N/A;  . MASTECTOMY, PARTIAL Left 1984   no cancer, fibrocystic breast disease  . SKIN SURGERY  1960   moles removed from various areas per patient report  . TONSILLECTOMY  1950   per patient report  . TUBAL LIGATION  1976  . VAGINAL HYSTERECTOMY  1981   vaginal hyst per patient report  . VIDEO BRONCHOSCOPY WITH ENDOBRONCHIAL NAVIGATION N/A 10/28/2015   Procedure: VIDEO BRONCHOSCOPY WITH ENDOBRONCHIAL NAVIGATION;  Surgeon: Grace Isaac, MD;  [facility];  Service: Thoracic;  Laterality: N/A;    Current Outpatient Medications  Medication Sig Dispense Refill  . acetaminophen-codeine (TYLENOL #3) 300-30 MG tablet Take 1 tablet by mouth every 6 (six) hours as needed for  moderate pain.    Marland Kitchen docusate sodium (COLACE) 100 MG capsule Take 100 mg by mouth at bedtime.     Marland Kitchen levothyroxine (SYNTHROID) 88 MCG tablet Take 1 tablet (88 mcg total) daily before breakfast by mouth. 30 tablet 3  . omeprazole (PRILOSEC) 20 MG capsule TAKE 1 CAPSULE BY MOUTH ONCE DAILY 30 capsule 3  . ondansetron (ZOFRAN-ODT) 8 MG disintegrating tablet Take 8 mg by mouth 3 (three) times daily. At 0700, 1500, and 2300     No current facility-administered medications for this visit.     Allergies as of 05/12/2017 - Review Complete  05/12/2017  Allergen Reaction Noted  . Adhesive [tape] Rash 10/16/2015  . Latex Rash 10/16/2015    No family history on file.  Social History   Socioeconomic History  . Marital status: Married    Spouse name: None  . Number of children: None  . Years of education: None  . Highest education level: None  Social Needs  . Financial resource strain: None  . Food insecurity - worry: None  . Food insecurity - inability: None  . Transportation needs - medical: None  . Transportation needs - non-medical: None  Occupational History  . None  Tobacco Use  . Smoking status: Former Smoker    Packs/day: 1.00    Years: 23.00    Pack years: 23.00    Types: Cigarettes    Last attempt to quit: 05/03/1982    Years since quitting: 35.0  . Smokeless tobacco: Never Used  Substance and Sexual Activity  . Alcohol use: No    Alcohol/week: 0.0 oz  . Drug use: No  . Sexual activity: No    Birth control/protection: None  Other Topics Concern  . None  Social History Narrative  . None    Review of Systems: Gen: see HPI  CV: Denies chest pain, palpitations, syncope, peripheral edema, and claudication. Resp: Denies dyspnea at rest, cough, wheezing, coughing up blood, and pleurisy. GI: see HPI  Derm: Denies rash, itching, dry skin Psych: Denies depression, anxiety, memory loss, confusion. No homicidal or suicidal ideation.  Heme: Denies bruising, bleeding, and enlarged lymph nodes.  Physical Exam: BP 131/86   Pulse 98   Temp 97.6 F (36.4 C) (Oral)   Ht 5' 7.5" (1.715 m)   Wt 169 lb 3.2 oz (76.7 kg)   BMI 26.11 kg/m  General:   Alert and oriented. No distress noted. Pleasant and cooperative.  Head:  Normocephalic and atraumatic. Eyes:  Conjuctiva clear without scleral icterus. Mouth:  Oral mucosa pink and moist.  Abdomen:  +BS, soft, non-tender and non-distended. No rebound or guarding. No HSM or masses noted. Msk:  Symmetrical without gross deformities. Normal  posture. Extremities:  Without edema. Neurologic:  Alert and  oriented x4 Psych:  Alert and cooperative. Normal mood and affect.

## 2017-05-13 ENCOUNTER — Telehealth: Payer: Self-pay

## 2017-05-13 NOTE — Telephone Encounter (Signed)
Called and informed pt of GES appt 05/19/17 at 9:00am, pt to arrive at 8:45am. NPO after midnight prior to test and no stomach medication. Letter mailed.

## 2017-05-13 NOTE — Patient Instructions (Signed)
PA info for GES submitted via [facility] website. Case approved. PA# [id number]. 05/12/17-06/26/17.

## 2017-05-17 LAB — VITAMIN D 25 HYDROXY (VIT D DEFICIENCY, FRACTURES): Vit D, 25-Hydroxy: 20 ng/mL — ABNORMAL LOW (ref 30–100)

## 2017-05-17 LAB — VITAMIN B1: Vitamin B1 (Thiamine): 26 nmol/L (ref 8–30)

## 2017-05-17 LAB — VITAMIN B12: Vitamin B-12: 575 pg/mL (ref 200–1100)

## 2017-05-17 LAB — MAGNESIUM: Magnesium: 1.9 mg/dL (ref 1.5–2.5)

## 2017-05-18 ENCOUNTER — Encounter: Payer: Self-pay | Admitting: Gastroenterology

## 2017-05-18 DIAGNOSIS — R002 Palpitations: Secondary | ICD-10-CM | POA: Insufficient documentation

## 2017-05-18 NOTE — Assessment & Plan Note (Signed)
Referral to cardiology. No associated chest pain or shortness of breath.

## 2017-05-18 NOTE — Assessment & Plan Note (Addendum)
77 year old female with three year history of intermittent N/V, with recent EGD overall unrevealing. Web in upper third of esophagus was dilated with scope passage. She has no further dysphagia and states she is only able to tolerate ensure. Reportedly had a GES in the past that was unequivocal. Remains on Prilosec, Zofran, states Reglan did not help in the past. Today, she has not had any vomiting in 15 days but endorses severe nausea.   Trial of Dexilant, samples provided Labs ordered today GES updated May need CT imaging Referral to cardiology due to intermittent palpitations. As of note, nausea is unrelated to these episodes Further recommendations to follow

## 2017-05-18 NOTE — Assessment & Plan Note (Signed)
Start Linzess 72 mcg once daily.

## 2017-05-19 ENCOUNTER — Encounter (HOSPITAL_COMMUNITY): Payer: Self-pay

## 2017-05-19 ENCOUNTER — Inpatient Hospital Stay (HOSPITAL_COMMUNITY): Payer: Medicare Other | Attending: Oncology

## 2017-05-19 ENCOUNTER — Encounter (HOSPITAL_COMMUNITY)
Admission: RE | Admit: 2017-05-19 | Discharge: 2017-05-19 | Disposition: A | Discharge status: Home / Self Care | Payer: Medicare Other | Source: Ambulatory Visit | Attending: Gastroenterology | Admitting: Gastroenterology

## 2017-05-19 DIAGNOSIS — E039 Hypothyroidism, unspecified: Secondary | ICD-10-CM

## 2017-05-19 DIAGNOSIS — C029 Malignant neoplasm of tongue, unspecified: Secondary | ICD-10-CM | POA: Diagnosis present

## 2017-05-19 DIAGNOSIS — R112 Nausea with vomiting, unspecified: Secondary | ICD-10-CM | POA: Diagnosis present

## 2017-05-19 LAB — COMPREHENSIVE METABOLIC PANEL
ALT: 18 U/L (ref 14–54)
AST: 21 U/L (ref 15–41)
Albumin: 4 g/dL (ref 3.5–5.0)
Alkaline Phosphatase: 73 U/L (ref 38–126)
Anion gap: 10 (ref 5–15)
BUN: 25 mg/dL — ABNORMAL HIGH (ref 6–20)
CO2: 27 mmol/L (ref 22–32)
Calcium: 9.4 mg/dL (ref 8.9–10.3)
Chloride: 101 mmol/L (ref 101–111)
Creatinine, Ser: 0.93 mg/dL (ref 0.44–1.00)
GFR calc Af Amer: >60 mL/min (ref >60)
GFR calc non Af Amer: 59 mL/min — ABNORMAL LOW (ref >60)
Glucose, Bld: 117 mg/dL — ABNORMAL HIGH (ref 65–99)
Potassium: 3.7 mmol/L (ref 3.5–5.1)
Sodium: 138 mmol/L (ref 135–145)
Total Bilirubin: 0.6 mg/dL (ref 0.3–1.2)
Total Protein: 7.5 g/dL (ref 6.5–8.1)

## 2017-05-19 LAB — CBC WITH DIFFERENTIAL/PLATELET
Basophils Absolute: 0 10*3/uL (ref 0.0–0.1)
Basophils Relative: 0 %
Eosinophils Absolute: 0.1 10*3/uL (ref 0.0–0.7)
Eosinophils Relative: 2 %
HCT: 41.3 % (ref 36.0–46.0)
Hemoglobin: 13.7 g/dL (ref 12.0–15.0)
Lymphocytes Relative: 19 %
Lymphs Abs: 0.6 10*3/uL — ABNORMAL LOW (ref 0.7–4.0)
MCH: 28.7 pg (ref 26.0–34.0)
MCHC: 33.2 g/dL (ref 30.0–36.0)
MCV: 86.6 fL (ref 78.0–100.0)
Monocytes Absolute: 0.3 10*3/uL (ref 0.1–1.0)
Monocytes Relative: 11 %
Neutro Abs: 1.9 10*3/uL (ref 1.7–7.7)
Neutrophils Relative %: 68 %
Platelets: 144 10*3/uL — ABNORMAL LOW (ref 150–400)
RBC: 4.77 MIL/uL (ref 3.87–5.11)
RDW: 13.3 % (ref 11.5–15.5)
WBC: 2.9 10*3/uL — ABNORMAL LOW (ref 4.0–10.5)

## 2017-05-19 LAB — TSH: TSH: 1.608 u[IU]/mL (ref 0.350–4.500)

## 2017-05-19 LAB — T4, FREE: Free T4: 1.07 ng/dL (ref 0.61–1.12)

## 2017-05-19 MED ORDER — TECHNETIUM TC 99M SULFUR COLLOID
2.0000 | Freq: Once | INTRAVENOUS | Status: AC | PRN
  Administered 2017-05-19: 2 via ORAL

## 2017-05-20 LAB — T3: T3, Total: 78 ng/dL (ref 71–180)

## 2017-05-20 NOTE — Progress Notes (Signed)
CC'D TO PCP °

## 2017-05-24 NOTE — Progress Notes (Signed)
B12, B1 normal. Magnesium normal. Vit D is insufficient, right on borderline of possible deficiency. GES was abnormal, with slowed stomach emptying. She could benefit from Reglan low dose. Would she be willing to try this? Also, recommend Vit D 1000 units daily, recheck Vit D in 8 weeks.

## 2017-05-26 ENCOUNTER — Telehealth: Payer: Self-pay | Admitting: Internal Medicine

## 2017-05-26 ENCOUNTER — Telehealth: Payer: Self-pay

## 2017-05-26 ENCOUNTER — Other Ambulatory Visit: Payer: Self-pay | Admitting: Gastroenterology

## 2017-05-26 MED ORDER — METOCLOPRAMIDE HCL 5 MG PO TABS: 5.0000 mg | ORAL_TABLET | Freq: Two times a day (BID) | ORAL | 1 refills | Status: DC

## 2017-05-26 MED ORDER — DEXLANSOPRAZOLE 60 MG PO CPDR: 60.0000 mg | DELAYED_RELEASE_CAPSULE | Freq: Every day | ORAL | 3 refills | Status: DC

## 2017-05-26 NOTE — Telephone Encounter (Signed)
PATIENT RETURNED CALL, PLEASE CALL BACK  °

## 2017-05-26 NOTE — Telephone Encounter (Signed)
Routing message to refill box. lmom for pt to return my call.

## 2017-05-26 NOTE — Telephone Encounter (Signed)
PATIENT CALLED AND STATED THAT THE DEXILANT SAMPLES WORKED AND WOULD LIKE A PRESCRIPTION SENT TO HER PHARMACY [facility] IN [place], AND WOULD LIKE TO KNOW THE RESULTS OF THE EMPTYING TEST [phone]

## 2017-05-26 NOTE — Telephone Encounter (Signed)
Dexilant sent to pharmacy. Please see result note on labs.

## 2017-05-26 NOTE — Progress Notes (Signed)
I sent in Reglan to take two times daily before meals. She needs to monitor for confusion, involuntary movements, as these would be side effect to report immediately and stop medication. I have sent in the low dose and only taking it BID, so I feel she should do well with this.

## 2017-05-26 NOTE — Telephone Encounter (Signed)
LMOM, Waiting on a returm call

## 2017-05-26 NOTE — Telephone Encounter (Signed)
Pt notified of results

## 2017-05-31 ENCOUNTER — Encounter: Payer: Self-pay | Admitting: Internal Medicine

## 2017-05-31 NOTE — Progress Notes (Signed)
PATIENT SCHEDULED  °

## 2017-06-21 ENCOUNTER — Other Ambulatory Visit: Payer: Self-pay

## 2017-06-21 ENCOUNTER — Telehealth: Payer: Self-pay | Admitting: Cardiovascular Disease

## 2017-06-21 ENCOUNTER — Encounter: Payer: Self-pay | Admitting: Cardiovascular Disease

## 2017-06-21 ENCOUNTER — Ambulatory Visit: Payer: Medicare Other | Admitting: Cardiovascular Disease

## 2017-06-21 VITALS — BP 134/93 | HR 103 | Ht 67.5 in | Wt 170.0 lb

## 2017-06-21 DIAGNOSIS — R002 Palpitations: Secondary | ICD-10-CM | POA: Diagnosis not present

## 2017-06-21 MED ORDER — METOPROLOL TARTRATE 25 MG PO TABS: 25.0000 mg | ORAL_TABLET | Freq: Two times a day (BID) | ORAL | 6 refills | Status: DC

## 2017-06-21 NOTE — Progress Notes (Signed)
CARDIOLOGY CONSULT NOTE  Patient ID: TAMU GOLZ MRN: [id number] DOB/AGE: 1940/05/23 77 y.o.  Admit date: (Not on file) Primary Physician: Octavio Graves, DO Referring Physician: Roseanne Kaufman NP  Reason for Consultation: Palpitations  HPI: Candace Lewis is a 77 y.o. female who is being seen today for the evaluation of palpitations at the request of Annitta Needs, NP.   Past medical history includes hypothyroidism and stage IVa squamous cell carcinoma of the tongue.  She was recently evaluated by gastroenterology for a 3-year history of intermittent nausea and vomiting with an unrevealing EGD.  She complained of palpitations and was thus referred to me.  I reviewed the chest CT performed on 08/27/16, which demonstrated a left upper lobe groundglass nodule.  Coronary artery calcifications and aortic atherosclerotic calcification was also noted.  I reviewed an ECG performed on 03/05/17 which showed sinus rhythm, 99 bpm.  TSH was normal at 1.6 on 05/19/17.  Additional labs include BUN 25, creatinine 0.93, potassium 3.7, sodium 138, white blood cells low at 2.9, mild thrombocytopenia with platelets 144, and magnesium 1.9 on 05/12/17.  She told me about her history of sporadic vomiting which is occurred ever since she had a feeding tube placed in September 2015.  She is uncertain when it will happen.  She only drinks Ensure with milk and water.  She has had palpitations with a fast heart rate for as long as she can remember.  Heart rates at home range up to 106 bpm and sometimes faster.  She denies chest pain.  She also denies leg swelling, orthopnea and syncope.   Allergies  Allergen Reactions  . Adhesive [Tape] Rash    Reports rash and blistering if any adhesive tape  . Latex Rash    Rash and skin blistering per patient report    Current Outpatient Medications  Medication Sig Dispense Refill  . acetaminophen-codeine (TYLENOL #3) 300-30 MG tablet Take 1 tablet by mouth  every 6 (six) hours as needed for moderate pain.    Marland Kitchen dexlansoprazole (DEXILANT) 60 MG capsule Take 1 capsule (60 mg total) by mouth daily. 90 capsule 3  . docusate sodium (COLACE) 100 MG capsule Take 100 mg by mouth at bedtime.     Marland Kitchen levothyroxine (SYNTHROID) 88 MCG tablet Take 1 tablet (88 mcg total) daily before breakfast by mouth. 30 tablet 3  . ondansetron (ZOFRAN-ODT) 8 MG disintegrating tablet Take 8 mg by mouth 3 (three) times daily. At 0700, 1500, and 2300     No current facility-administered medications for this visit.     Past Medical History:  Diagnosis Date  . Arthritis    lower back  . Constipation   . Diverticulitis   . Ectopic pregnancy 1977  . Esophageal cancer ([place]) 2015   tongue cancer, throat cancer  . GERD (gastroesophageal reflux disease)   . Headache    migraines  . Hypertension    no longer taking meds  . Hypokalemia 2015  . Hypothyroidism   . Lung nodule, multiple 01/08/2015  . Macular degeneration of right eye   . Malignant neoplasm of tongue ([place]) 01/24/2014  . Thyroid disease     Past Surgical History:  Procedure Laterality Date  . BIOPSY PHARYNX  2015  . BREAST BIOPSY Bilateral    benign  . CHOLECYSTECTOMY  2005  . ESOPHAGOGASTRODUODENOSCOPY  06/19/2014   Dr. Britta Mccreedy: LA Class B esophagitis. PEG tube in place. erythema in proximal esophagus.   . ESOPHAGOGASTRODUODENOSCOPY (EGD)  WITH PROPOFOL N/A 03/11/2017   Web in upper third of esophagus s/p dilation with scope passage, small hiatal hernia, normal duodenum, no specimens collected  . FUDUCIAL PLACEMENT N/A 10/28/2015   Procedure: PLACEMENT OF FUDUCIAL TIMES THREE; LEFT UPPER LOBE BIOPSY;  Surgeon: Grace Isaac, MD;  Location: [place];  Service: Thoracic;  Laterality: N/A;  . MASTECTOMY, PARTIAL Left 1984   no cancer, fibrocystic breast disease  . SKIN SURGERY  1960   moles removed from various areas per patient report  . TONSILLECTOMY  1950   per patient report  . TUBAL LIGATION  1976  .  VAGINAL HYSTERECTOMY  1981   vaginal hyst per patient report  . VIDEO BRONCHOSCOPY WITH ENDOBRONCHIAL NAVIGATION N/A 10/28/2015   Procedure: VIDEO BRONCHOSCOPY WITH ENDOBRONCHIAL NAVIGATION;  Surgeon: Grace Isaac, MD;  Location: [facility];  Service: Thoracic;  Laterality: N/A;    Social History   Socioeconomic History  . Marital status: Married    Spouse name: Not on file  . Number of children: Not on file  . Years of education: Not on file  . Highest education level: Not on file  Social Needs  . Financial resource strain: Not on file  . Food insecurity - worry: Not on file  . Food insecurity - inability: Not on file  . Transportation needs - medical: Not on file  . Transportation needs - non-medical: Not on file  Occupational History  . Not on file  Tobacco Use  . Smoking status: Former Smoker    Packs/day: 1.00    Years: 23.00    Pack years: 23.00    Types: Cigarettes    Last attempt to quit: 05/03/1982    Years since quitting: 35.1  . Smokeless tobacco: Never Used  Substance and Sexual Activity  . Alcohol use: No    Alcohol/week: 0.0 oz  . Drug use: No  . Sexual activity: No    Birth control/protection: None  Other Topics Concern  . Not on file  Social History Narrative  . Not on file     No family history of premature CAD in 1st degree relatives.  Current Meds  Medication Sig  . acetaminophen-codeine (TYLENOL #3) 300-30 MG tablet Take 1 tablet by mouth every 6 (six) hours as needed for moderate pain.  Marland Kitchen dexlansoprazole (DEXILANT) 60 MG capsule Take 1 capsule (60 mg total) by mouth daily.  Marland Kitchen docusate sodium (COLACE) 100 MG capsule Take 100 mg by mouth at bedtime.   Marland Kitchen levothyroxine (SYNTHROID) 88 MCG tablet Take 1 tablet (88 mcg total) daily before breakfast by mouth.  . ondansetron (ZOFRAN-ODT) 8 MG disintegrating tablet Take 8 mg by mouth 3 (three) times daily. At 0700, 1500, and 2300      Review of systems complete and found to be negative unless listed  above in HPI    Physical exam Blood pressure (!) 134/93, pulse (!) 103, height 5' 7.5" (1.715 m), weight 170 lb (77.1 kg), SpO2 98 %. General: NAD Neck: No JVD, no thyromegaly or thyroid nodule.  Lungs: Clear to auscultation bilaterally with normal respiratory effort. CV: Nondisplaced PMI.  Tachycardic, regular rhythm, normal S1/S2, no S3/S4, no murmur.  No peripheral edema.  No carotid bruit.    Abdomen: Soft, nontender, no distention.  Skin: Intact without lesions or rashes.  Neurologic: Alert and oriented x 3.  Psych: Normal affect. Extremities: No clubbing or cyanosis.  HEENT: Normal.   ECG: Most recent ECG reviewed.   Labs: Lab Results  Component Value Date/Time   K 3.7 05/19/2017 08:08 AM   BUN 25 (H) 05/19/2017 08:08 AM   CREATININE 0.93 05/19/2017 08:08 AM   ALT 18 05/19/2017 08:08 AM   TSH 1.608 05/19/2017 08:07 AM   HGB 13.7 05/19/2017 08:08 AM     Lipids: No results found for: LDLCALC, LDLDIRECT, CHOL, TRIG, HDL      ASSESSMENT AND PLAN:  1.  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  I will start metoprolol tartrate 25 mg twice daily.  I will also obtain an echocardiogram to evaluate cardiac structure and function.     Disposition: Follow up in 3 months  Signed: Kate Sable, M.D., F.A.C.C.  06/21/2017, 1:37 PM

## 2017-06-21 NOTE — Patient Instructions (Signed)
Medication Instructions:   Begin Lopressor 25mg  twice a day   Continue all other medications.    Labwork: none  Testing/Procedures:  Your physician has requested that you have an echocardiogram. Echocardiography is a painless test that uses sound waves to create images of your heart. It provides your doctor with information about the size and shape of your heart and how well your heart's chambers and valves are working. This procedure takes approximately one hour. There are no restrictions for this procedure.  Office will contact with results via phone or letter.    Follow-Up: 3 months   Any Other Special Instructions Will Be Listed Below (If Applicable).  If you need a refill on your cardiac medications before your next appointment, please call your pharmacy.

## 2017-06-21 NOTE — Telephone Encounter (Signed)
Echo - palp scheduled at [facility] July 07, 2017

## 2017-06-24 ENCOUNTER — Other Ambulatory Visit: Payer: Self-pay | Admitting: Cardiovascular Disease

## 2017-06-24 DIAGNOSIS — R000 Tachycardia, unspecified: Secondary | ICD-10-CM

## 2017-07-07 ENCOUNTER — Other Ambulatory Visit: Payer: Self-pay

## 2017-07-07 ENCOUNTER — Ambulatory Visit (INDEPENDENT_AMBULATORY_CARE_PROVIDER_SITE_OTHER): Payer: Medicare Other

## 2017-07-07 DIAGNOSIS — R000 Tachycardia, unspecified: Secondary | ICD-10-CM | POA: Diagnosis not present

## 2017-07-15 ENCOUNTER — Other Ambulatory Visit (HOSPITAL_COMMUNITY): Payer: Self-pay

## 2017-07-15 ENCOUNTER — Ambulatory Visit (INDEPENDENT_AMBULATORY_CARE_PROVIDER_SITE_OTHER): Payer: Medicare Other | Admitting: Otolaryngology

## 2017-07-15 DIAGNOSIS — Z85810 Personal history of malignant neoplasm of tongue: Secondary | ICD-10-CM | POA: Diagnosis not present

## 2017-07-15 DIAGNOSIS — C029 Malignant neoplasm of tongue, unspecified: Secondary | ICD-10-CM

## 2017-07-16 ENCOUNTER — Inpatient Hospital Stay (HOSPITAL_COMMUNITY): Payer: Medicare Other | Attending: Internal Medicine | Admitting: Internal Medicine

## 2017-07-16 ENCOUNTER — Encounter (HOSPITAL_COMMUNITY): Payer: Self-pay | Admitting: Internal Medicine

## 2017-07-16 ENCOUNTER — Inpatient Hospital Stay (HOSPITAL_COMMUNITY): Payer: Medicare Other | Attending: Internal Medicine

## 2017-07-16 ENCOUNTER — Other Ambulatory Visit: Payer: Self-pay

## 2017-07-16 VITALS — BP 129/84 | HR 74 | Temp 98.0°F | Resp 18 | Wt 170.8 lb

## 2017-07-16 DIAGNOSIS — E039 Hypothyroidism, unspecified: Secondary | ICD-10-CM | POA: Diagnosis not present

## 2017-07-16 DIAGNOSIS — C029 Malignant neoplasm of tongue, unspecified: Secondary | ICD-10-CM | POA: Insufficient documentation

## 2017-07-16 DIAGNOSIS — Z87891 Personal history of nicotine dependence: Secondary | ICD-10-CM | POA: Diagnosis not present

## 2017-07-16 DIAGNOSIS — R911 Solitary pulmonary nodule: Secondary | ICD-10-CM | POA: Diagnosis not present

## 2017-07-16 LAB — CBC WITH DIFFERENTIAL/PLATELET
Basophils Absolute: 0 10*3/uL (ref 0.0–0.1)
Basophils Relative: 0 %
Eosinophils Absolute: 0.1 10*3/uL (ref 0.0–0.7)
Eosinophils Relative: 3 %
HCT: 39.4 % (ref 36.0–46.0)
Hemoglobin: 12.8 g/dL (ref 12.0–15.0)
Lymphocytes Relative: 17 %
Lymphs Abs: 0.4 10*3/uL — ABNORMAL LOW (ref 0.7–4.0)
MCH: 28.3 pg (ref 26.0–34.0)
MCHC: 32.5 g/dL (ref 30.0–36.0)
MCV: 87.2 fL (ref 78.0–100.0)
Monocytes Absolute: 0.2 10*3/uL (ref 0.1–1.0)
Monocytes Relative: 9 %
Neutro Abs: 1.8 10*3/uL (ref 1.7–7.7)
Neutrophils Relative %: 71 %
Platelets: 127 10*3/uL — ABNORMAL LOW (ref 150–400)
RBC: 4.52 MIL/uL (ref 3.87–5.11)
RDW: 13.4 % (ref 11.5–15.5)
WBC: 2.6 10*3/uL — ABNORMAL LOW (ref 4.0–10.5)

## 2017-07-16 LAB — TSH: TSH: 2.146 u[IU]/mL (ref 0.350–4.500)

## 2017-07-16 LAB — COMPREHENSIVE METABOLIC PANEL
ALT: 15 U/L (ref 14–54)
AST: 19 U/L (ref 15–41)
Albumin: 3.8 g/dL (ref 3.5–5.0)
Alkaline Phosphatase: 64 U/L (ref 38–126)
Anion gap: 8 (ref 5–15)
BUN: 25 mg/dL — ABNORMAL HIGH (ref 6–20)
CO2: 27 mmol/L (ref 22–32)
Calcium: 9.2 mg/dL (ref 8.9–10.3)
Chloride: 102 mmol/L (ref 101–111)
Creatinine, Ser: 0.87 mg/dL (ref 0.44–1.00)
GFR calc Af Amer: >60 mL/min (ref >60)
GFR calc non Af Amer: >60 mL/min (ref >60)
Glucose, Bld: 100 mg/dL — ABNORMAL HIGH (ref 65–99)
Potassium: 3.7 mmol/L (ref 3.5–5.1)
Sodium: 137 mmol/L (ref 135–145)
Total Bilirubin: 0.7 mg/dL (ref 0.3–1.2)
Total Protein: 6.8 g/dL (ref 6.5–8.1)

## 2017-07-16 LAB — T4, FREE: Free T4: 1.17 ng/dL — ABNORMAL HIGH (ref 0.61–1.12)

## 2017-07-16 MED ORDER — LEVOTHYROXINE SODIUM 88 MCG PO TABS: 88.0000 ug | ORAL_TABLET | Freq: Every day | ORAL | 2 refills | Status: DC

## 2017-07-16 NOTE — Patient Instructions (Signed)
[place] at [facility] Discharge Instructions   You were seen today by Dr. Zoila Shutter. She went over your labs your labs were overall stable. She went over your vomiting and what procedures you have had to help with that. She discussed you being followed by the lung cancer screening program. Would like for you to get a PET scan, we will schedule you for that. Return in 4 months for labs and follow up.   Thank you for choosing [place] at [facility] to provide your oncology and hematology care.  To afford each patient quality time with our provider, please arrive at least 15 minutes before your scheduled appointment time.    If you have a lab appointment with the [place] please come in thru the  Main Entrance and check in at the main information desk  You need to re-schedule your appointment should you arrive 10 or more minutes late.  We strive to give you quality time with our providers, and arriving late affects you and other patients whose appointments are after yours.  Also, if you no show three or more times for appointments you may be dismissed from the clinic at the providers discretion.     Again, thank you for choosing [facility].  Our hope is that these requests will decrease the amount of time that you wait before being seen by our physicians.       _____________________________________________________________  Should you have questions after your visit to [facility], please contact our office at [phone] between the hours of 8:30 a.m. and 4:30 p.m.  Voicemails left after 4:30 p.m. will not be returned until the following business day.  For prescription refill requests, have your pharmacy contact our office.       Resources For Cancer Patients and their Caregivers ? American Cancer Society: Can assist with transportation, wigs, general needs, runs Look Good Feel Better.         6414560019 ? Cancer Care: Provides financial assistance, online support groups, medication/co-pay assistance.  1-800-813-HOPE 346-300-9731) ? [place] Assists [place] cancer patients and their families through emotional , educational and financial support.  (716) 737-3481 ? [place] DSS Where to apply for food stamps, Medicaid and utility assistance. 430 254 3631 ? [facility]: Transportation to medical appointments. 314-610-7497 ? Social Security Administration: May apply for disability if have a Stage IV cancer. 812-464-8032 (623)852-5542 ? [employer], Disability and Transit Services: Assists with nutrition, care and transit needs. [place] Support Programs:   > Cancer Support Group  2nd Tuesday of the month 1pm-2pm, Journey Room   > Creative Journey  3rd Tuesday of the month 1130am-1pm, Journey Room

## 2017-07-16 NOTE — Progress Notes (Signed)
Diagnosis Malignant neoplasm of tongue ([place]) - Plan: NM PET Image Restag (PS) Skull Base To Thigh, CBC with Differential/Platelet, Comprehensive metabolic panel, Lactate dehydrogenase, levothyroxine (SYNTHROID) 88 MCG tablet  Hypothyroidism, unspecified type - Plan: levothyroxine (SYNTHROID) 88 MCG tablet  Staging Cancer Staging No matching staging information was found for the patient.  Assessment and Plan:  1.  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  Pt was Diagnosed 01/2014; treated with concurrent chemoradiation; completed treatment on 04/03/14.  She reports she was seen by Dr. Benjamine Mola recently and had laryngoscopy performed that was negative.  She has noted evidence of a pulmonary nodule that was noted on PET scan done in June 2017.  Findings on PET are suspicious for an adenocarcinoma.  She reports she was being followed by Dr. Servando Snare.  I discussed with her she will be set up for repeat PET scan for restaging evaluation and will be referred back to CT surgery pending the results.  If PET is negative she will return to clinic in 3-4 months for follow-up.  2.  Pulmonary nodule.  The patient was seen by Dr. Servando Snare in April 2018.  She reports he has discussed with her options of biopsy.  Based on the PET scan findings from June 2027 being suspicious for adenocarcinoma she will be set up for repeat imaging and his office will be contacted for follow-up.  3.  GI symptoms such as diarrhea and acid reflux.  She has been seen by GI and underwent endoscopic evaluation which was reportedly negative.  She should continue to follow-up with GI as recommended.  4.  Hypothyroidism.  This was felt to be due to chemoradiation.  She is being followed by Mike Craze for management of her thyroid supplementation.  We have notified her to refill the patient's medication if she desires the patient to have ongoing therapy.  5.  Leukopenia.  WBC count is 2.6.  Will repeat labs on RTC.    All questions  were answered and the patient expressed understanding of the information presented.   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  Pt was Diagnosed 01/2014; treated with concurrent chemoradiation; completed treatment on 04/03/14.  She reports she was seen by Dr. Benjamine Mola recently and had laryngoscopy performed that was negative.  She has noted evidence of a pulmonary nodule that was noted on PET scan done in June 2017.  Findings on PET are suspicious for an adenocarcinoma.  She reports she was being followed by Dr. Servando Snare.   Current status patient is seen today for follow-up.  She is complaining of diarrhea and acid reflux.  She reports she was recently seen by Dr. Benjamine Mola and Dr. Gala Romney and has undergone endoscopic evaluation with both physicians which was reportedly negative.  She needs refills on her thyroid medication.   Orders placed this encounter:  Orders Placed This Encounter  Procedures  . CT MAXILLOFACIAL W & WO CONTRAST  . CT SOFT TISSUE NECK W WO CONTRAST  . TSH  . T3  . T4, free  . CBC with Differential/Platelet  . 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      01/19/2014 PET scan    Tongue mass with 2 level II Right neck nodes T2N2b, Stage IVA      02/08/2014 - 04/03/2014 Radiation Therapy    Initiation of radiation therapy with 70  Gy delivered to the right base of tongue/tonsil 63 Gy to high risk nodal echelons, 56 Gy to the intermediate r base of tongue/tonsil/bilateral neck nodes      02/08/2014 - 03/15/2014 Chemotherapy    weekly cisplatin, only 4 doses given, severe toxicity with neutropenia, dehydration, nausea, vomiting, hospitalization and obstipation      10/24/2015 PET scan    low level non malig range hyper metab in L apical sub solid pulm nodule, suspicious for low grade adeno. diffuse tongue hypermetab without CT correlation       10/28/2015 Procedure    Bronchoscopy navigation, electromagnetic navigation, bronchoscopy with biopsy of LUL lung lesion, placement of fiducial markers X 3      10/28/2015 Pathology Results    Scant benign lung tissue, no tumor seen      Problem List Patient Active Problem List   Diagnosis Date Noted  . Palpitations [R00.2] 05/18/2017  . Constipation [K59.00] 01/14/2017  . Esophageal dysphagia [R13.10] 01/13/2017  . Oral thrush [B37.0] 01/13/2017  . Nodule of left lung [R91.1] 10/17/2015  . Allergy to adhesive tape [Z91.048] 10/15/2015  . Diverticulitis large intestine [K57.32] 10/08/2015  . 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 Lung nodule, multiple [R91.8] 01/08/2015  . N&V (nausea and vomiting) [R11.2] 06/12/2014  . Hypotension, postural [I95.1] 06/12/2014  . Malignant neoplasm of tongue ([place]) [C02.9] 01/24/2014    Past Medical History Past Medical History:  Diagnosis Date  . Arthritis    lower back  . Constipation   . Diverticulitis   . Ectopic pregnancy 1977  . 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 GERD (gastroesophageal reflux disease)   . Headache    migraines  . Hypertension    no longer taking meds  . Hypokalemia 2015  . Hypothyroidism   . Lung nodule, multiple 01/08/2015  . Macular degeneration of right eye   . Malignant neoplasm of tongue ([place]) 01/24/2014  . Thyroid disease     Past Surgical History Past Surgical History:  Procedure Laterality Date  . BIOPSY PHARYNX  2015  . BREAST BIOPSY Bilateral    benign  . CHOLECYSTECTOMY  2005  . ESOPHAGOGASTRODUODENOSCOPY  06/19/2014   Dr. Britta Mccreedy: LA Class B esophagitis. PEG tube in place. erythema in proximal esophagus.   . ESOPHAGOGASTRODUODENOSCOPY (EGD) WITH PROPOFOL N/A 03/11/2017   Web in upper third of esophagus s/p dilation with scope passage, small hiatal hernia, normal duodenum, no specimens collected  . FUDUCIAL PLACEMENT N/A 10/28/2015   Procedure: PLACEMENT OF FUDUCIAL  TIMES THREE; LEFT UPPER LOBE BIOPSY;  Surgeon: Grace Isaac, MD;  Location: [facility];  Service: Thoracic;  Laterality: N/A;  . MASTECTOMY, PARTIAL Left 1984   no cancer, fibrocystic breast disease  . SKIN SURGERY  1960   moles removed from various areas per patient report  . TONSILLECTOMY  1950   per patient report  . TUBAL LIGATION  1976  . VAGINAL HYSTERECTOMY  1981   vaginal hyst per patient report  . VIDEO BRONCHOSCOPY WITH ENDOBRONCHIAL NAVIGATION N/A 10/28/2015   Procedure: VIDEO BRONCHOSCOPY WITH ENDOBRONCHIAL NAVIGATION;  Surgeon: Grace Isaac, MD;  Location: [facility];  Service: Thoracic;  Laterality: N/A;    Family History History reviewed. No pertinent family history.   Social History  reports that she quit smoking about 35 years ago. Her smoking use included cigarettes. She has a 23.00 pack-year smoking history. she has never used smokeless tobacco. She reports that she does not drink alcohol or use drugs.  Medications  Current Outpatient Medications:  .  acetaminophen-codeine (TYLENOL #3) 300-30 MG tablet, Take 1 tablet by mouth every 6 (six) hours as needed for moderate pain., Disp: , Rfl:  .  docusate sodium (COLACE) 100 MG capsule, Take 100 mg by mouth at bedtime. , Disp: , Rfl:  .  levothyroxine (SYNTHROID) 88 MCG tablet, Take 1 tablet (88 mcg total) by mouth daily before breakfast., Disp: 90 tablet, Rfl: 2 .  metoprolol tartrate (LOPRESSOR) 25 MG tablet, Take 1 tablet (25 mg total) by mouth 2 (two) times daily., Disp: 60 tablet, Rfl: 6 .  ondansetron (ZOFRAN-ODT) 8 MG disintegrating tablet, Take 8 mg by mouth 3 (three) times daily. At 0700, 1500, and 2300, Disp: , Rfl:   Allergies Adhesive [tape] and Latex  Review of Systems Review of Systems - Oncology ROS as per HPI otherwise 12 point ROS is negative.   Physical Exam  Vitals Wt Readings from Last 3 Encounters:  07/16/17 170 lb 12.8 oz (77.5 kg)  06/21/17 170 lb (77.1 kg)  05/12/17 169 lb 3.2 oz (76.7  kg)   Temp Readings from Last 3 Encounters:  07/16/17 98 F (36.7 C) (Oral)  05/12/17 97.6 F (36.4 C) (Oral)  03/19/17 98.4 F (36.9 C) (Oral)   BP Readings from Last 3 Encounters:  07/16/17 129/84  06/21/17 (!) 134/93  05/12/17 131/86   Pulse Readings from Last 3 Encounters:  07/16/17 74  06/21/17 (!) 103  05/12/17 98   Constitutional: Well-developed, well-nourished, and in no distress.   HENT: Head: Normocephalic and atraumatic. Evidence of RT changes in the neck Mouth/Throat: No oropharyngeal exudate. Mucosa moist. Eyes: Pupils are equal, round, and reactive to light. Conjunctivae are normal. No scleral icterus.  Neck: Normal range of motion. Neck supple. No JVD present.  Cardiovascular: Normal rate, regular rhythm and normal heart sounds.  Exam reveals no gallop and no friction rub.   No murmur heard. Pulmonary/Chest: Effort normal and breath sounds normal. No respiratory distress. No wheezes.No rales.  Abdominal: Soft. Bowel sounds are normal. No distension. There is no tenderness. There is no guarding.  Musculoskeletal: No edema or tenderness.  Lymphadenopathy: No palpable cervical, axillary or supraclavicular adenopathy.  Neurological: Alert and oriented to person, place, and time. No cranial nerve deficit.  Skin: Skin is warm and dry. No rash noted. No erythema. No pallor.  Psychiatric: Affect and judgment normal.   Labs Appointment on 07/16/2017  Component Date Value Ref Range Status  . TSH 07/16/2017 2.146  0.350 - 4.500 uIU/mL Final   Comment: Performed by a 3rd Generation assay with a functional sensitivity of <=0.01 uIU/mL. Performed at [facility], [address], [place], [place] [address]   . WBC 07/16/2017 2.6* 4.0 - 10.5 K/uL Final  . RBC 07/16/2017 4.52  3.87 - 5.11 MIL/uL Final  . Hemoglobin 07/16/2017 12.8  12.0 - 15.0 g/dL Final  . HCT 07/16/2017 39.4  36.0 - 46.0 % Final  . MCV 07/16/2017 87.2  78.0 - 100.0 fL Final  . MCH 07/16/2017 28.3  26.0 -  34.0 pg Final  . MCHC 07/16/2017 32.5  30.0 - 36.0 g/dL Final  . RDW 07/16/2017 13.4  11.5 - 15.5 % Final  . Platelets 07/16/2017 127* 150 - 400 K/uL Final  . Neutrophils Relative % 07/16/2017 71  % Final  . Neutro Abs 07/16/2017 1.8  1.7 - 7.7 K/uL Final  . Lymphocytes Relative 07/16/2017 17  % Final  . Lymphs Abs 07/16/2017 0.4* 0.7 - 4.0 K/uL  Final  . Monocytes Relative 07/16/2017 9  % Final  . Monocytes Absolute 07/16/2017 0.2  0.1 - 1.0 K/uL Final  . Eosinophils Relative 07/16/2017 3  % Final  . Eosinophils Absolute 07/16/2017 0.1  0.0 - 0.7 K/uL Final  . Basophils Relative 07/16/2017 0  % Final  . Basophils Absolute 07/16/2017 0.0  0.0 - 0.1 K/uL Final   Performed at [facility], [address], [place], [place] [address]  . Sodium 07/16/2017 137  135 - 145 mmol/L Final  . Potassium 07/16/2017 3.7  3.5 - 5.1 mmol/L Final  . Chloride 07/16/2017 102  101 - 111 mmol/L Final  . CO2 07/16/2017 27  22 - 32 mmol/L Final  . Glucose, Bld 07/16/2017 100* 65 - 99 mg/dL Final  . BUN 07/16/2017 25* 6 - 20 mg/dL Final  . Creatinine, Ser 07/16/2017 0.87  0.44 - 1.00 mg/dL Final  . Calcium 07/16/2017 9.2  8.9 - 10.3 mg/dL Final  . Total Protein 07/16/2017 6.8  6.5 - 8.1 g/dL Final  . Albumin 07/16/2017 3.8  3.5 - 5.0 g/dL Final  . AST 07/16/2017 19  15 - 41 U/L Final  . ALT 07/16/2017 15  14 - 54 U/L Final  . Alkaline Phosphatase 07/16/2017 64  38 - 126 U/L Final  . Total Bilirubin 07/16/2017 0.7  0.3 - 1.2 mg/dL Final  . GFR calc non Af Amer 07/16/2017 >60  >60 mL/min Final  . GFR calc Af Amer 07/16/2017 >60  >60 mL/min Final   Comment: (NOTE) The eGFR has been calculated using the CKD EPI equation. This calculation has not been validated in all clinical situations. eGFR's persistently <60 mL/min signify possible Chronic Kidney Disease.   Georgiann Hahn gap 07/16/2017 8  5 - 15 Final   Performed at [facility], [address] [place], [place] [address]     Pathology Orders Placed This  Encounter  Procedures  . NM PET Image Restag (PS) Skull Base To Thigh    Standing Status:   Future    Standing Expiration Date:   07/16/2018    Order Specific Question:   If indicated for the ordered procedure, I authorize the administration of a radiopharmaceutical per Radiology protocol    Answer:   Yes    Order Specific Question:   Preferred imaging location?    Answer:   [facility]    Order Specific Question:   Radiology Contrast Protocol - do NOT remove file path    Answer:   [email or web address]  . CBC with Differential/Platelet    Standing Status:   Future    Standing Expiration Date:   07/18/2018  . Comprehensive metabolic panel    Standing Status:   Future    Standing Expiration Date:   07/18/2018  . Lactate dehydrogenase    Standing Status:   Future    Standing Expiration Date:   07/18/2018       Zoila Shutter MD

## 2017-07-17 LAB — T3: T3, Total: 78 ng/dL (ref 71–180)

## 2017-07-18 ENCOUNTER — Other Ambulatory Visit (HOSPITAL_COMMUNITY): Payer: Self-pay | Admitting: Adult Health

## 2017-07-23 ENCOUNTER — Other Ambulatory Visit: Payer: Self-pay | Admitting: *Deleted

## 2017-07-23 DIAGNOSIS — R918 Other nonspecific abnormal finding of lung field: Secondary | ICD-10-CM

## 2017-07-23 DIAGNOSIS — R911 Solitary pulmonary nodule: Secondary | ICD-10-CM

## 2017-07-23 NOTE — Progress Notes (Unsigned)
ct 

## 2017-07-29 ENCOUNTER — Ambulatory Visit: Payer: Medicare Other | Admitting: Gastroenterology

## 2017-08-02 ENCOUNTER — Encounter (HOSPITAL_COMMUNITY): Payer: Self-pay

## 2017-08-02 ENCOUNTER — Ambulatory Visit (HOSPITAL_COMMUNITY)
Admission: RE | Admit: 2017-08-02 | Discharge: 2017-08-02 | Disposition: A | Discharge status: Home / Self Care | Payer: Medicare Other | Source: Ambulatory Visit | Attending: Internal Medicine | Admitting: Internal Medicine

## 2017-08-02 DIAGNOSIS — C029 Malignant neoplasm of tongue, unspecified: Secondary | ICD-10-CM | POA: Insufficient documentation

## 2017-08-02 DIAGNOSIS — Y842 Radiological procedure and radiotherapy as the cause of abnormal reaction of the patient, or of later complication, without mention of misadventure at the time of the procedure: Secondary | ICD-10-CM | POA: Insufficient documentation

## 2017-08-02 MED ORDER — FLUDEOXYGLUCOSE F - 18 (FDG) INJECTION
10.0000 | Freq: Once | INTRAVENOUS | Status: AC
  Administered 2017-08-02: 9.58 via INTRAVENOUS

## 2017-08-03 ENCOUNTER — Ambulatory Visit: Payer: Medicare Other | Admitting: Cardiothoracic Surgery

## 2017-08-10 ENCOUNTER — Other Ambulatory Visit: Payer: Medicare Other

## 2017-08-10 ENCOUNTER — Ambulatory Visit: Payer: Medicare Other | Admitting: Cardiothoracic Surgery

## 2017-08-10 ENCOUNTER — Encounter: Payer: Self-pay | Admitting: Cardiothoracic Surgery

## 2017-08-10 ENCOUNTER — Other Ambulatory Visit: Payer: Self-pay

## 2017-08-10 VITALS — BP 150/70 | HR 62 | Resp 18 | Ht 67.5 in | Wt 169.8 lb

## 2017-08-10 DIAGNOSIS — R911 Solitary pulmonary nodule: Secondary | ICD-10-CM

## 2017-08-10 NOTE — Progress Notes (Signed)
[address]       [place] [address]             [phone]                    Bevelyn A [facility] Medical Record #[id number] Date of Birth: 21-Jun-1940  Referring: Everardo All, MD Primary Care: Octavio Graves, DO  Chief Complaint:    Chief Complaint  Patient presents with  . Lung Lesion    6 month f/u, no CT     History of Present Illness:    Candace Lewis 77 y.o. female Who has been followed in the office for groundglass opacity  in the lung of the left upper lobe for several years.  The patient has a complex history of present previous cancer of the head and neck. 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 Since that time she's continued to have trouble taking a diet and having episodic spells of vomiting and inability to swallow.  She'd had a gastrostomy tube in the past but this has been removed.  She notes that over the past month she has had some improvement in the ability to eat and avoid vomiting.  She was seen by oncology in [place] recently with some neck complaints and a PET scan was done to rule out recurrence of head and neck cancer .  In June 2017 we did a navigation bronchoscopy to  a semisolid left apical pulmonary nodule.  The cytology and biopsy results show no evidence of malignancy.   The patient remains very apprehensive about having any invasive procedures none, she notes that the past several years with her chemotherapy and radiation treatment of her head and neck cancer has been very difficult for her.  She notes in the near future she is to have upper GI endoscopy and possibly dilatation in [place].  Current Activity/ Functional Status:  Patient is independent with mobility/ambulation, transfers, ADL's,  IADL's.   Zubrod Score: At the time of surgery this patient's most appropriate activity status/level should be described as: []     0    Normal activity, no symptoms [x]     1    Restricted in physical strenuous activity but ambulatory, able to do out light work []     2    Ambulatory and capable of self care, unable to do work activities, up and about               >50 % of waking hours                              []     3    Only limited self care, in bed greater than 50% of waking hours []     4    Completely disabled, no self care, confined to bed or chair []     5    Moribund   Past Medical History   Hypertension    High cholesterol    High triglycerides    Squamous cell cancer of tongue (*)    Colon cancer (*)  cancer tongue  Right ear pain August 2015 since diagnosed with cancer tongue  Hypokalemia    Thrush  of mouth and esophagus (*)  Dr Lisbeth Renshaw examined Radiation visit. Script Diflucan given.  Diverticulitis      Past Surgical History:  Procedure Laterality Date  . BIOPSY PHARYNX  2015  . BREAST BIOPSY Bilateral    benign  . CHOLECYSTECTOMY  2005  . ESOPHAGOGASTRODUODENOSCOPY  06/19/2014   Dr. Britta Mccreedy: LA Class B esophagitis. PEG tube in place. erythema in proximal esophagus.   . ESOPHAGOGASTRODUODENOSCOPY (EGD) WITH PROPOFOL N/A 03/11/2017   Web in upper third of esophagus s/p dilation with scope passage, small hiatal hernia, normal duodenum, no specimens collected  . FUDUCIAL PLACEMENT N/A 10/28/2015   Procedure: PLACEMENT OF FUDUCIAL TIMES THREE; LEFT UPPER LOBE BIOPSY;  Surgeon: Grace Isaac, MD;  Location: [facility];  Service: Thoracic;  Laterality: N/A;  . MASTECTOMY, PARTIAL Left 1984   no cancer, fibrocystic breast disease  . SKIN SURGERY  1960   moles removed from various areas per patient report  . TONSILLECTOMY  1950   per patient report  . TUBAL LIGATION  1976  . VAGINAL HYSTERECTOMY  1981   vaginal hyst per patient report  . VIDEO BRONCHOSCOPY  WITH ENDOBRONCHIAL NAVIGATION N/A 10/28/2015   Procedure: VIDEO BRONCHOSCOPY WITH ENDOBRONCHIAL NAVIGATION;  Surgeon: Grace Isaac, MD;  Location: [facility];  Service: Thoracic;  Laterality: N/A;   [place]  mole removal  [place]    GALLBLADDER SURGERY 2005    EMPHYSEMA     PEG TUBE PLACEMENT 01/29/2014    PORTACATH PLACEMENT 01/29/2014       Family History: Patient's mother died of COPD and heart failure, father died of diabetes  Social History   Social History  . Marital Status: Married    Spouse Name: N/A  . Number of Children: N/A  . Years of Education: N/A   Occupational History  . 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   Social History Main Topics  . Smoking status: Former Smoker -- 1.00 packs/day for 23 years    Types: Cigarettes    Quit date: 05/03/1982  . Smokeless tobacco: Never Used  . Alcohol Use: Not on file  . Drug Use: Not on file  . Sexual Activity: Not on file     Social History   Tobacco Use  Smoking Status Former Smoker  . Packs/day: 1.00  . Years: 23.00  . Pack years: 23.00  . Types: Cigarettes  . Last attempt to quit: 05/03/1982  . Years since quitting: 35.2  Smokeless Tobacco Never Used    Social History   Substance and Sexual Activity  Alcohol Use No  . Alcohol/week: 0.0 oz     Allergies  Allergen Reactions  . Adhesive [Tape] Rash    Reports rash and blistering if any adhesive tape  . Latex Rash    Rash and skin blistering per patient report    Current Outpatient Medications  Medication Sig Dispense Refill  . acetaminophen-codeine (TYLENOL #3) 300-30 MG tablet Take 1 tablet by mouth every 6 (six) hours as needed for moderate pain.    Marland Kitchen docusate sodium (COLACE) 100 MG capsule Take 100 mg  by mouth at bedtime.     Marland Kitchen levothyroxine (SYNTHROID) 88 MCG tablet Take 1 tablet (88 mcg total) by mouth daily before breakfast. 90 tablet  2  . metoprolol tartrate (LOPRESSOR) 25 MG tablet Take 1 tablet (25 mg total) by mouth 2 (two) times daily. 60 tablet 6  . ondansetron (ZOFRAN-ODT) 8 MG disintegrating tablet Take 8 mg by mouth 3 (three) times daily. At 0700, 1500, and 2300     No current facility-administered medications for this visit.      Review of Systems:     Cardiac Review of Systems: Y or N  Chest Pain [  n  ]  Resting SOB [n   ] Exertional SOB  [ n ]  Orthopnea [n ]   Pedal Edema [ n  ]    Palpitations [ n ] Syncope  [ n ]   Presyncope [ n ]  General Review of Systems: [Y] = yes [  ]=no Constitional: recent weight change [  ];  Wt loss over the last 3 months [ n  ] anorexia [  ]; fatigue [  ]; nausea [  ]; night sweats [  ]; fever [  ]; or chills [  ];          Dental: poor dentition[ dentures ]; Last Dentist visit:   Eye : blurred vision [  ]; diplopia [   ]; vision changes [  ];  Amaurosis fugax[  ]; Resp: cough [  ];  wheezing[ y];  hemoptysis[  ]; shortness of breath[  ]; paroxysmal nocturnal dyspnea[  ]; dyspnea on exertion[  ]; or orthopnea[  ];  GI:  gallstones[  ], vomiting[ y ];  dysphagia[y  ]; melena[ n ];  hematochezia [n  ]; heartburn[  ];   Hx of  Colonoscopy[  ]; GU: kidney stones [  ]; hematuria[  ];   dysuria [  ];  nocturia[  ];  history of     obstruction [  ]; urinary frequency [  ]             Skin: rash, swelling[  ];, hair loss[  ];  peripheral edema[  ];  or itching[  ]; Musculosketetal: myalgias[  ];  joint swelling[  ];  joint erythema[  ];  joint pain[  ];  back pain[  ];  Heme/Lymph: bruising[  ];  bleeding[  ];  anemia[  ];  Neuro: TIA[  ];  headaches[  ];  stroke[  ];  vertigo[  ];  seizures[  ];   paresthesias[  ];  difficulty walking[  ];  Psych:depression[  ]; anxiety[  ];  Endocrine: diabetes[n  ];  thyroid dysfunction[  ];  Immunizations: Flu  up to date [ y ]; Pneumococcal up to date [ n ];  Other:  Physical Exam: BP (!) 150/70 (BP Location: Left Arm, Patient Position: Sitting, Cuff Size: Normal)   Pulse 62   Resp 18   Ht 5' 7.5" (1.715 m)   Wt 169 lb 12.8 oz (77 kg)   SpO2 98% Comment: RA  BMI 26.20 kg/m   PHYSICAL EXAMINATION: General appearance: alert, cooperative and no distress Head: Normocephalic, without obvious abnormality, atraumatic Neck: no adenopathy, no carotid bruit, no JVD, supple, symmetrical, trachea midline, thyroid not enlarged, symmetric, no tenderness/mass/nodules and There is thickening of the skin of the neck but node definite palpable cervical adenopathy or cervical mass Lymph nodes: Cervical, supraclavicular, and axillary nodes normal. Resp: clear to auscultation bilaterally Back: symmetric, no curvature. ROM normal. No CVA tenderness. Cardio: regular rate and rhythm, S1, S2 normal, no murmur, click, rub or gallop GI: soft, non-tender;  bowel sounds normal; no masses,  no organomegaly Extremities: extremities normal, atraumatic, no cyanosis or edema and Homans sign is negative, no sign of DVT Neurologic: Grossly normal  Diagnostic Studies & Laboratory data:     Recent Radiology Findings:    Nm Pet Image Restag (ps) Skull Base To Thigh  Result Date: 08/02/2017 CLINICAL DATA:  Subsequent treatment strategy for head neck carcinoma.Cancer on tongue and neck, Radiation therapy and 4 Chemotherapy treatments 2017 EXAM: NUCLEAR MEDICINE PET SKULL BASE TO THIGH TECHNIQUE: 9.6 mCi F-18 FDG was injected intravenously. Full-ring PET imaging was performed from the skull base to thigh after the radiotracer. CT data was obtained and used for attenuation correction and anatomic localization. Fasting blood glucose: 97 mg/dl COMPARISON:  PET-CT 10/24/2015 neck CT 03/30/2017 FINDINGS: Mediastinal blood pool activity: SUV max 2.4 NECK: No focal activity within the oropharynx to suggest local head neck carcinoma  recurrence. No hypermetabolic cervical lymph nodes. Incidental CT findings: none CHEST: Mild metabolic activity associated with ground-glass opacity with cental reticulonodular density in the LEFT upper lobe is not changed in size or imaging characteristics from comparison exam and has low metabolic activity with SUV max equal 1.6. This is consistent with a focus of post radiation inflammation. Fiducial markers in the LEFT upper lobe. No hypermetabolic mediastinal lymph nodes. No suspicious pulmonary nodules. Incidental CT findings: none ABDOMEN/PELVIS: No abnormal hypermetabolic activity within the liver, pancreas, adrenal glands, or spleen. No hypermetabolic lymph nodes in the abdomen or pelvis. Incidental CT findings: Atherosclerotic calcification of the aorta. Post hysterectomy. SKELETON: No focal hypermetabolic activity to suggest skeletal metastasis. Incidental CT findings: none IMPRESSION: 1. No evidence local head neck cancer recurrence. 2. No evidence of metastatic cervical lymphadenopathy. 3. No evidence of distant metastatic disease. 4. Stable post radiation change in the LEFT upper lobe. Electronically Signed   By: Suzy Bouchard M.D.   On: 08/02/2017 18:45   I have independently reviewed the above radiology studies  and reviewed the findings with the patient.    Ct Super D Chest Wo Contrast  Result Date: 02/18/2017 CLINICAL DATA:  Pulmonary nodule.  History of throat cancer. EXAM: CT CHEST WITHOUT CONTRAST TECHNIQUE: Multidetector CT imaging of the chest was performed using thin slice collimation for electromagnetic bronchoscopy planning purposes, without intravenous contrast. COMPARISON:  08/27/2016 FINDINGS: Cardiovascular: The heart size is normal. No pericardial effusion. Coronary artery calcification is evident. Atherosclerotic calcification is noted in the wall of the thoracic aorta. Mediastinum/Nodes: Small mediastinal lymph nodes measure up to a maximum of about 9 mm short axis in the  precarinal station. No evidence for gross hilar lymphadenopathy although assessment is limited by the lack of intravenous contrast on today's study. The esophagus has normal imaging features. There is no axillary lymphadenopathy. Lungs/Pleura: Biapical pleural-parenchymal scarring is similar to prior. Similar appearance mixed attenuation irregular nodule posterior left upper lobe, measuring 2.0 x 2.2 cm today compared to 2.2 x 2.4 cm previously. 3 mm right upper lobe nodule (image 32 series 4) is stable. 3 mm ground-glass nodule in the right upper lobe on image 41 is unchanged. Left upper lobe fiducial markers are evident. No pulmonary edema or pleural effusion. Upper Abdomen: Unremarkable. Musculoskeletal: Bone windows reveal no worrisome lytic or sclerotic osseous lesions. IMPRESSION: 1. Similar appearance mixed attenuation left upper lobe pulmonary lesion. 2. Exam also otherwise stable with no new or progressive findings. Electronically Signed   By: Misty Stanley M.D.   On: 02/18/2017 15:09    Ct Chest Wo Contrast  Result  Date: 08/27/2016 CLINICAL DATA:  LEFT lung nodule. Head neck cancer with radiation and chemotherapy EXAM: CT CHEST WITHOUT CONTRAST TECHNIQUE: Multidetector CT imaging of the chest was performed following the standard protocol without IV contrast. COMPARISON:  CT 02/20/2016 FINDINGS: Cardiovascular: Coronary artery calcification and aortic atherosclerotic calcification. Mediastinum/Nodes: No axillary supraclavicular adenopathy. No mediastinal hilar adenopathy. No pericardial. Esophagus normal Lungs/Pleura: Ground-glass nodule in the superior aspect of the LEFT upper lobe has ill-defined borders and measures approximately 22 x 24 mm compared with 22 by 24 mm on prior (02/20/2016) for. Nodular peribronchial component measuring 4 mm (image 23, series 4) within lesion compares 6 mm on prior. No addition additional pulmonary nodules within the lungs. Airways normal. Upper Abdomen: Limited view  of the liver, kidneys, pancreas are unremarkable. Normal adrenal glands. Musculoskeletal: No aggressive osseous lesion. IMPRESSION: No change in LEFT upper lobe ground-glass nodule with central solid nodule. Lesion remains concerning for atypical adenomatous hyperplasia. Recommend continued CT surveillance. Electronically Signed   By: Suzy Bouchard M.D.   On: 08/27/2016 12:32   Ct Chest Wo Contrast  Result Date: 02/20/2016 CLINICAL DATA:  Followup lung mass EXAM: CT CHEST WITHOUT CONTRAST TECHNIQUE: Multidetector CT imaging of the chest was performed following the standard protocol without IV contrast. COMPARISON:  10/24/2015 FINDINGS: Cardiovascular: Aortic atherosclerosis. Normal heart size. No pericardial effusion. Mediastinum/Nodes: The trachea appears patent and is midline. Normal appearance of the esophagus. No enlarged mediastinal or hilar lymph nodes. Lungs/Pleura: No pleural effusion. Mild changes of centrilobular emphysema identified. The sub solid lesion within the left upper lobe Measures 2.2 cm, image 28 of series 4. This is unchanged from previous exam. The internal solid component Measures 6 mm, image 24 of series 4. Previously 7 mm. No new pulmonary nodules or masses identified. Upper Abdomen: No acute abnormality. Musculoskeletal: No chest wall mass or suspicious bone lesions identified. IMPRESSION: 1. No acute cardiopulmonary abnormalities. 2. Stable part solid nodule within the left upper lobe which remains suspicious for low-grade adenocarcinoma (atypical adenomatous hyperplasia). 3. Emphysema 4. Aortic atherosclerosis. Electronically Signed   By: Kerby Moors M.D.   On: 02/20/2016 11:45   Nm Pet Image Initial (pi) Skull Base To Thigh  10/24/2015  CLINICAL DATA:  Subsequent treatment strategy for lung mass. History of tongue cancer. EXAM: NUCLEAR MEDICINE PET SKULL BASE TO THIGH TECHNIQUE: 8.4 mCi F-18 FDG was injected intravenously. Full-ring PET imaging was performed from the skull  base to thigh after the radiotracer. CT data was obtained and used for attenuation correction and anatomic localization. FASTING BLOOD GLUCOSE:  Value: 99 mg/dl COMPARISON:  Chest CT 10/07/2015. Abdominal pelvic CT of 09/18/2015. Most recent PET of 07/03/2014. FINDINGS: NECK Extensive muscular activity throughout the neck. Given this factor, no cervical hypermetabolic nodes identified. Relatively diffuse tongue hypermetabolism is without CT correlate and most likely due to motion after radiopharmaceutical injection. Similarly, hypermetabolism about the posterior aspect of the larynx is symmetric and likely physiologic. CHEST Low-level hypermetabolism which corresponds to the sub solid left apical pulmonary nodule. This measures 2.8 cm and a S.U.V. max of 1.7 on image 14/series 8. compare a S.U.V. max of 2.2 on the 07/03/2014 exam. No thoracic nodal hypermetabolism. ABDOMEN/PELVIS No areas of abnormal hypermetabolism. SKELETON Focus of hypermetabolism about the anterior right acetabulum is favored to be related to tendinous strain at the origin of thigh extensor musculature. This measures a S.U.V. max of 5.8. Similarly, there is hypermetabolism about the anterior left femoral head/ neck junction which is favored to be due to adjacent  muscular strain. No well-defined osseous lesion in either site. CT IMAGES PERFORMED FOR ATTENUATION CORRECTION No cervical adenopathy. Bilateral carotid atherosclerosis. Thoracic aortic and branch vessel atherosclerosis. LAD coronary artery atherosclerosis. Chest findings deferred to recent diagnostic CT. Lower pole right renal fluid density lesion is likely a cyst. Abdominal aortic atherosclerosis. Cholecystectomy. Hysterectomy. IMPRESSION: 1. Low-level, non malignant range hypermetabolism corresponding to the left apical sub solid pulmonary nodule. This remains suspicious for low-grade adenocarcinoma, given morphology and interval enlargement on prior diagnostic CT. 2. Diffuse tongue  hypermetabolism without CT correlate. Recommend physical exam correlation to confirm physiologic or related to motion after pharmaceutical injection. 3. No evidence of hypermetabolic nodes to suggest metastatic disease. 4.  Coronary artery atherosclerosis. Aortic atherosclerosis. Electronically Signed   By: Abigail Miyamoto M.D.   On: 10/24/2015 10:09      CLINICAL DATA: Followup of pulmonary nodule. Throat cancer in 2015 with chemotherapy and radiation therapy.  EXAM: CT CHEST WITHOUT CONTRAST  TECHNIQUE: Multidetector CT imaging of the chest was performed following the standard protocol without IV contrast.  COMPARISON: Chest radiograph 09/18/2015. Abdominal pelvic CT 09/18/2015. Chest CT 04/05/2015.  FINDINGS: Mediastinum/Nodes: Aortic and branch vessel atherosclerosis. Normal heart size, without pericardial effusion. LAD coronary artery atherosclerosis. No mediastinal or definite hilar adenopathy, given limitations of unenhanced CT.  Lungs/Pleura: No pleural fluid. Biapical pleural-parenchymal scarring. A 3 mm right upper lobe pulmonary nodule on image 29/series 4 is felt to be similar to on the prior exam. There are smaller right apical pulmonary nodules, including at 3 mm on image 30/ series 4, which are likely more apparent today secondary to differences in slice thickness.  A 3 mm right lower lobe subpleural nodule on image 69/series 4 similar.  Sub solid left apical pulmonary nodule measures 2.9 x 2.9 cm on image 30/series 4. Compare 2.0 x 1.5 cm on the most recent exam. The solid components measure on the order of 8 mm on image 27/series 4 and are also felt to be increased.  Upper abdomen: Cholecystectomy. Normal imaged portions of the liver, spleen, stomach, pancreas, adrenal glands, kidneys.  Musculoskeletal: Nodularity in the lateral left breast measures 1.3 cm on image 70/series 2 and is unchanged. No acute osseous abnormality.  IMPRESSION: 1. Increase in size  of a sub solid left apical pulmonary nodule. This remains suspicious for a low-grade adenocarcinoma, which had regressed on the prior exam secondary to chemotherapy. 2. Other pulmonary nodules are primarily similar. Some right apical nodules are more conspicuous today, favored to be due to differences in slice thickness. 3. Atherosclerosis, including within the coronary arteries. 4. Similar left breast nodule which could be re-evaluated at followup or more entirely characterized with mammogram/ultrasound. 5. No thoracic adenopathy to suggest nodal metastasis.   Electronically Signed By: Abigail Miyamoto M.D. On: 10/07/2015 14:02  Final Report  CLINICAL DATA: 77 year old female with history of multiple pulmonary nodules. Followup study. Additional history of tongue cancer.  EXAM: CT CHEST WITH CONTRAST  TECHNIQUE: Multidetector CT imaging of the chest was performed during intravenous contrast administration.  CONTRAST: 60 mL of Isovue 370.  COMPARISON: Chest CT 01/04/2015. PET-CT 07/03/2014.  FINDINGS: Mediastinum/Lymph Nodes: Heart size is normal. There is no significant pericardial fluid, thickening or pericardial calcification. There is atherosclerosis of the thoracic aorta, the great vessels of the mediastinum and the coronary arteries, including calcified atherosclerotic plaque in the left anterior descending coronary artery. No pathologically enlarged mediastinal or hilar lymph nodes. Esophagus is unremarkable in appearance. No axillary lymphadenopathy. Right internal jugular single-lumen porta  cath with tip terminating in the distal superior vena cava.  Lungs/Pleura: Again noted is an ill-defined nodular area in the apex of the left upper lobe, which is predominantly ground-glass attenuation (image 15 of series 4) measuring 2.0 x 1.5 cm (slightly smaller than prior study 01/04/2015 at which point this measured 2.1 x 2.2 cm), with a tiny central solid component  measuring only 4 mm (image 15 of series 2), which is also slightly less apparent than prior studies. No other new suspicious appearing pulmonary nodules or masses are identified. The no acute consolidative airspace disease. No pleural effusions.  Upper Abdomen: Status post cholecystectomy.  Musculoskeletal/Soft Tissues: There are no aggressive appearing lytic or blastic lesions noted in the visualized portions of the skeleton.  IMPRESSION: 1. Previously described mixed solid and sub solid nodule in the apex of the left upper lobe is less apparent than prior examinations. Although the regression is reassuring, based on behavior on prior examinations, this remains concerning for potential low-grade adenocarcinoma, and the regression may simply reflect response to chemotherapy for the patient's head neck cancer. Continued attention on future followup examinations is recommended. 2. No other suspicious appearing pulmonary nodules or masses in the lungs at this time to suggest metastatic disease. 3. Atherosclerosis, including left anterior descending coronary artery disease. Please note that although the presence of coronary artery calcium documents the presence of coronary artery disease, the severity of this disease and any potential stenosis cannot be assessed on this non-gated CT examination. Assessment for potential risk factor modification, dietary therapy or pharmacologic therapy may be warranted, if clinically indicated. 4. Additional incidental findings, as above.   Electronically Signed By: Vinnie Langton M.D. On: 04/05/2015 13:17    Recent Lab Findings: Lab Results  Component Value Date   WBC 2.6 (L) 07/16/2017   HGB 12.8 07/16/2017   HCT 39.4 07/16/2017   PLT 127 (L) 07/16/2017   GLUCOSE 100 (H) 07/16/2017   ALT 15 07/16/2017   AST 19 07/16/2017   NA 137 07/16/2017   K 3.7 07/16/2017   CL 102 07/16/2017   CREATININE 0.87 07/16/2017   BUN 25 (H) 07/16/2017     CO2 27 07/16/2017   TSH 2.146 07/16/2017   INR 1.00 10/28/2015   PFT's :  FEV1 2.10 85% DLCO 16.75 59%  1. spirometry shows minimal airflow obstruction without bronchodilator improvement. 2.lung volumes are normal. 3.airway resistance is normal. 4. DLCO moderately reduced   Assessment / Plan:   1/ History of squamous cell carcinoma of the tongue, treated with radiation and chemotherapy 2015 with continued posttreatment problems with some difficulty swallowing likely radiation induced. 2/patient has a, groundglass left apical pulmonary nodule suspicious for low-grade carcinoma attempted biopsy with navigation bronchoscopy in June 2017 was negative for malignancy. Follow-up scan compared to 6 months ago showed little change in the area, but definitely enlargement since 2017. Again discussed with the patient proceeding with a more aggressive approach to obtain a tissue diagnosis and possibly consider stereotactic radiotherapy if appropriate.  She is very concerned about any operative intervention primarily because of her difficulty swallowing. She is agreeable to a follow-up CT scan in 6 months to confirm stability.      3/ Atherosclerosis, including left anterior descending coronary     artery disease.- by ct     Grace Isaac MD      [address] [place] [address] Office [phone]   Beeper [id number]  08/10/2017 3:03 PM

## 2017-08-19 DIAGNOSIS — C099 Malignant neoplasm of tonsil, unspecified: Secondary | ICD-10-CM | POA: Insufficient documentation

## 2017-08-24 ENCOUNTER — Ambulatory Visit: Payer: Medicare Other | Admitting: Cardiovascular Disease

## 2017-08-26 ENCOUNTER — Encounter: Payer: Self-pay | Admitting: Cardiovascular Disease

## 2017-08-26 ENCOUNTER — Ambulatory Visit: Payer: Medicare Other | Admitting: Cardiovascular Disease

## 2017-08-26 VITALS — BP 100/70 | HR 61 | Ht 67.5 in | Wt 169.0 lb

## 2017-08-26 DIAGNOSIS — R002 Palpitations: Secondary | ICD-10-CM | POA: Diagnosis not present

## 2017-08-26 DIAGNOSIS — R000 Tachycardia, unspecified: Secondary | ICD-10-CM

## 2017-08-26 DIAGNOSIS — R42 Dizziness and giddiness: Secondary | ICD-10-CM

## 2017-08-26 DIAGNOSIS — I700 Atherosclerosis of aorta: Secondary | ICD-10-CM | POA: Diagnosis not present

## 2017-08-26 MED ORDER — METOPROLOL TARTRATE 25 MG PO TABS: 12.5000 mg | ORAL_TABLET | Freq: Two times a day (BID) | ORAL | 3 refills | Status: DC

## 2017-08-26 NOTE — Patient Instructions (Signed)
Medication Instructions:  Your physician has recommended you make the following change in your medication:   DECREASE Lopressor to 12.5 mg twice daily   Please continue all other medications as prescribed  Labwork: NONE  Testing/Procedures: NONE  Follow-Up: Your physician recommends that you schedule a follow-up appointment in: [place] DR. Bronson Ing  Any Other Special Instructions Will Be Listed Below (If Applicable).  If you need a refill on your cardiac medications before your next appointment, please call your pharmacy.

## 2017-08-26 NOTE — Progress Notes (Signed)
SUBJECTIVE: Patient presents for follow-up of palpitations.  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  Echocardiogram 07/07/2017 showed normal left ventricular systolic function and normal regional wall motion, LVEF 55 to 60%.  I started metoprolol 25 mg twice daily at her last visit.  She is no longer experiencing palpitations when lying on her left side.  However, she has been significantly dizzy and loses balance although she denies falls and syncope.  She denies chest pain and shortness of breath.  I reviewed her blood pressure log which shows readings ranging from 101-122/55-74 with a heart rate ranging from 55-66 bpm.  She also asked me to review her PET scan which showed no evidence of cancer recurrence and some atherosclerosis of the aorta.    Review of Systems: As per "subjective", otherwise negative.  Allergies  Allergen Reactions  . Adhesive [Tape] Rash    Reports rash and blistering if any adhesive tape  . Latex Rash    Rash and skin blistering per patient report    Current Outpatient Medications  Medication Sig Dispense Refill  . acetaminophen-codeine (TYLENOL #3) 300-30 MG tablet Take 1 tablet by mouth every 6 (six) hours as needed for moderate pain.    Marland Kitchen docusate sodium (COLACE) 100 MG capsule Take 100 mg by mouth at bedtime.     Marland Kitchen levothyroxine (SYNTHROID) 88 MCG tablet Take 1 tablet (88 mcg total) by mouth daily before breakfast. 90 tablet 2  . metoprolol tartrate (LOPRESSOR) 25 MG tablet Take 1 tablet (25 mg total) by mouth 2 (two) times daily. 60 tablet 6  . ondansetron (ZOFRAN-ODT) 8 MG disintegrating tablet Take 8 mg by mouth 3 (three) times daily. At 0700, 1500, and 2300     No current facility-administered medications for this visit.     Past Medical History:  Diagnosis Date  . Arthritis    lower back  . Constipation   . Diverticulitis   . Ectopic pregnancy 1977  . 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 GERD  (gastroesophageal reflux disease)   . Headache    migraines  . Hypertension    no longer taking meds  . Hypokalemia 2015  . Hypothyroidism   . Lung nodule, multiple 01/08/2015  . Macular degeneration of right eye   . Malignant neoplasm of tongue ([place]) 01/24/2014  . Thyroid disease     Past Surgical History:  Procedure Laterality Date  . BIOPSY PHARYNX  2015  . BREAST BIOPSY Bilateral    benign  . CHOLECYSTECTOMY  2005  . ESOPHAGOGASTRODUODENOSCOPY  06/19/2014   Dr. Britta Mccreedy: LA Class B esophagitis. PEG tube in place. erythema in proximal esophagus.   . ESOPHAGOGASTRODUODENOSCOPY (EGD) WITH PROPOFOL N/A 03/11/2017   Web in upper third of esophagus s/p dilation with scope passage, small hiatal hernia, normal duodenum, no specimens collected  . FUDUCIAL PLACEMENT N/A 10/28/2015   Procedure: PLACEMENT OF FUDUCIAL TIMES THREE; LEFT UPPER LOBE BIOPSY;  Surgeon: Grace Isaac, MD;  Location: [facility];  Service: Thoracic;  Laterality: N/A;  . MASTECTOMY, PARTIAL Left 1984   no cancer, fibrocystic breast disease  . SKIN SURGERY  1960   moles removed from various areas per patient report  . TONSILLECTOMY  1950   per patient report  . TUBAL LIGATION  1976  . VAGINAL HYSTERECTOMY  1981   vaginal hyst per patient report  . VIDEO BRONCHOSCOPY WITH ENDOBRONCHIAL NAVIGATION N/A 10/28/2015   Procedure: VIDEO BRONCHOSCOPY WITH ENDOBRONCHIAL NAVIGATION;  Surgeon: Grace Isaac, MD;  Location: [facility] OR;  Service: Thoracic;  Laterality: N/A;    Social History   Socioeconomic History  . Marital status: Married    Spouse name: Not on file  . Number of children: Not on file  . Years of education: Not on file  . Highest education level: Not on file  Occupational History  . Not on file  Social Needs  . Financial resource strain: Not on file  . Food insecurity:    Worry: Not on file    Inability: Not on file  . Transportation needs:    Medical: Not on file    Non-medical: Not on file  Tobacco  Use  . Smoking status: Former Smoker    Packs/day: 1.00    Years: 23.00    Pack years: 23.00    Types: Cigarettes    Last attempt to quit: 05/03/1982    Years since quitting: 35.3  . Smokeless tobacco: Never Used  Substance and Sexual Activity  . Alcohol use: No    Alcohol/week: 0.0 oz  . Drug use: No  . Sexual activity: Never    Birth control/protection: None  Lifestyle  . Physical activity:    Days per week: Not on file    Minutes per session: Not on file  . Stress: Not on file  Relationships  . Social connections:    Talks on phone: Not on file    Gets together: Not on file    Attends religious service: Not on file    Active member of club or organization: Not on file    Attends meetings of clubs or organizations: Not on file    Relationship status: Not on file  . Intimate partner violence:    Fear of current or ex partner: Not on file    Emotionally abused: Not on file    Physically abused: Not on file    Forced sexual activity: Not on file  Other Topics Concern  . Not on file  Social History Narrative  . Not on file     Vitals:   08/26/17 1517  BP: 100/70  Pulse: 61  SpO2: 97%  Weight: 169 lb (76.7 kg)  Height: 5' 7.5" (1.715 m)    Wt Readings from Last 3 Encounters:  08/26/17 169 lb (76.7 kg)  08/10/17 169 lb 12.8 oz (77 kg)  07/16/17 170 lb 12.8 oz (77.5 kg)     PHYSICAL EXAM General: NAD HEENT: Normal. Neck: No JVD, no thyromegaly. Lungs: Clear to auscultation bilaterally with normal respiratory effort. CV: Regular rate and rhythm, normal S1/S2, no S3/S4, no murmur. No pretibial or periankle edema.  No carotid bruit.   Abdomen: Soft, nontender, no distention.  Neurologic: Alert and oriented.  Psych: Normal affect. Skin: Normal. Musculoskeletal: No gross deformities.    ECG: Most recent ECG reviewed.   Labs: Lab Results  Component Value Date/Time   K 3.7 07/16/2017 01:50 PM   BUN 25 (H) 07/16/2017 01:50 PM   CREATININE 0.87  07/16/2017 01:50 PM   ALT 15 07/16/2017 01:50 PM   TSH 2.146 07/16/2017 01:49 PM   HGB 12.8 07/16/2017 01:50 PM     Lipids: No results found for: LDLCALC, LDLDIRECT, CHOL, TRIG, HDL     ASSESSMENT AND PLAN: 1.  Palpitations/inappropriate sinus tachycardia with dizziness: She has developed dizziness on metoprolol tartrate 25 mg twice daily with relief of palpitations.  Left ventricular systolic function is normal.  I will reduce the dose to 12.5  mg twice daily.  2.  Aortic atherosclerosis: This was an incidental finding on her most recent PET scan which I reviewed with her.  No need for further cardiovascular investigations at this time as she has no symptoms to suggest ischemic heart disease.   Disposition: Follow up 3 months   Kate Sable, M.D., F.A.C.C.

## 2017-08-30 ENCOUNTER — Other Ambulatory Visit (HOSPITAL_COMMUNITY): Payer: Self-pay | Admitting: *Deleted

## 2017-08-30 DIAGNOSIS — Z1231 Encounter for screening mammogram for malignant neoplasm of breast: Secondary | ICD-10-CM

## 2017-09-15 ENCOUNTER — Ambulatory Visit: Payer: Medicare Other | Admitting: Gastroenterology

## 2017-11-03 ENCOUNTER — Encounter (HOSPITAL_COMMUNITY): Payer: Self-pay

## 2017-11-03 ENCOUNTER — Ambulatory Visit (HOSPITAL_COMMUNITY)
Admission: RE | Admit: 2017-11-03 | Discharge: 2017-11-03 | Disposition: A | Discharge status: Home / Self Care | Payer: Medicare Other | Source: Ambulatory Visit | Attending: *Deleted | Admitting: *Deleted

## 2017-11-03 ENCOUNTER — Other Ambulatory Visit (HOSPITAL_COMMUNITY): Payer: Self-pay | Admitting: *Deleted

## 2017-11-03 DIAGNOSIS — Z1231 Encounter for screening mammogram for malignant neoplasm of breast: Secondary | ICD-10-CM | POA: Diagnosis present

## 2017-11-15 ENCOUNTER — Encounter (HOSPITAL_COMMUNITY): Payer: Self-pay | Admitting: Internal Medicine

## 2017-11-15 ENCOUNTER — Inpatient Hospital Stay (HOSPITAL_COMMUNITY): Payer: Medicare Other | Attending: Hematology | Admitting: Internal Medicine

## 2017-11-15 ENCOUNTER — Ambulatory Visit (HOSPITAL_COMMUNITY): Payer: Medicare Other | Admitting: Hematology

## 2017-11-15 ENCOUNTER — Inpatient Hospital Stay (HOSPITAL_COMMUNITY): Payer: Medicare Other | Attending: Hematology

## 2017-11-15 ENCOUNTER — Other Ambulatory Visit: Payer: Self-pay

## 2017-11-15 ENCOUNTER — Other Ambulatory Visit (HOSPITAL_COMMUNITY): Payer: Medicare Other

## 2017-11-15 VITALS — BP 94/65 | HR 88 | Temp 97.7°F | Resp 18 | Wt 169.9 lb

## 2017-11-15 DIAGNOSIS — C029 Malignant neoplasm of tongue, unspecified: Secondary | ICD-10-CM

## 2017-11-15 DIAGNOSIS — I10 Essential (primary) hypertension: Secondary | ICD-10-CM | POA: Diagnosis not present

## 2017-11-15 DIAGNOSIS — D696 Thrombocytopenia, unspecified: Secondary | ICD-10-CM | POA: Diagnosis not present

## 2017-11-15 DIAGNOSIS — R1310 Dysphagia, unspecified: Secondary | ICD-10-CM | POA: Insufficient documentation

## 2017-11-15 DIAGNOSIS — E039 Hypothyroidism, unspecified: Secondary | ICD-10-CM | POA: Diagnosis not present

## 2017-11-15 DIAGNOSIS — Z87891 Personal history of nicotine dependence: Secondary | ICD-10-CM

## 2017-11-15 DIAGNOSIS — R911 Solitary pulmonary nodule: Secondary | ICD-10-CM | POA: Diagnosis not present

## 2017-11-15 LAB — COMPREHENSIVE METABOLIC PANEL
ALT: 13 U/L (ref 0–44)
AST: 19 U/L (ref 15–41)
Albumin: 3.8 g/dL (ref 3.5–5.0)
Alkaline Phosphatase: 45 U/L (ref 38–126)
Anion gap: 5 (ref 5–15)
BUN: 25 mg/dL — ABNORMAL HIGH (ref 8–23)
CO2: 29 mmol/L (ref 22–32)
Calcium: 9.2 mg/dL (ref 8.9–10.3)
Chloride: 106 mmol/L (ref 98–111)
Creatinine, Ser: 0.96 mg/dL (ref 0.44–1.00)
GFR calc Af Amer: >60 mL/min (ref >60)
GFR calc non Af Amer: 56 mL/min — ABNORMAL LOW (ref >60)
Glucose, Bld: 104 mg/dL — ABNORMAL HIGH (ref 70–99)
Potassium: 3.8 mmol/L (ref 3.5–5.1)
Sodium: 140 mmol/L (ref 135–145)
Total Bilirubin: 0.8 mg/dL (ref 0.3–1.2)
Total Protein: 6.7 g/dL (ref 6.5–8.1)

## 2017-11-15 LAB — CBC WITH DIFFERENTIAL/PLATELET
Basophils Absolute: 0 10*3/uL (ref 0.0–0.1)
Basophils Relative: 1 %
Eosinophils Absolute: 0.1 10*3/uL (ref 0.0–0.7)
Eosinophils Relative: 2 %
HCT: 40.9 % (ref 36.0–46.0)
Hemoglobin: 13.9 g/dL (ref 12.0–15.0)
Lymphocytes Relative: 20 %
Lymphs Abs: 0.6 10*3/uL — ABNORMAL LOW (ref 0.7–4.0)
MCH: 30.3 pg (ref 26.0–34.0)
MCHC: 34 g/dL (ref 30.0–36.0)
MCV: 89.3 fL (ref 78.0–100.0)
Monocytes Absolute: 0.2 10*3/uL (ref 0.1–1.0)
Monocytes Relative: 9 %
Neutro Abs: 1.9 10*3/uL (ref 1.7–7.7)
Neutrophils Relative %: 68 %
Platelets: 109 10*3/uL — ABNORMAL LOW (ref 150–400)
RBC: 4.58 MIL/uL (ref 3.87–5.11)
RDW: 13 % (ref 11.5–15.5)
WBC: 2.8 10*3/uL — ABNORMAL LOW (ref 4.0–10.5)

## 2017-11-15 LAB — LACTATE DEHYDROGENASE: LDH: 139 U/L (ref 98–192)

## 2017-11-15 MED ORDER — FLUCONAZOLE 40 MG/ML PO SUSR: 150.0000 mg | Freq: Once | ORAL | 0 refills | Status: AC

## 2017-11-15 NOTE — Patient Instructions (Signed)
[facility] at [facility] Discharge Instructions  Today you saw Dr. Higgs.    Thank you for choosing [facility] at [facility] to provide your oncology and hematology care.  To afford each patient quality time with our provider, please arrive at least 15 minutes before your scheduled appointment time.   If you have a lab appointment with the Cancer Center please come in thru the  Main Entrance and check in at the main information desk  You need to re-schedule your appointment should you arrive 10 or more minutes late.  We strive to give you quality time with our providers, and arriving late affects you and other patients whose appointments are after yours.  Also, if you no show three or more times for appointments you may be dismissed from the clinic at the providers discretion.     Again, thank you for choosing [facility].  Our hope is that these requests will decrease the amount of time that you wait before being seen by our physicians.       _____________________________________________________________  Should you have questions after your visit to [facility], please contact our office at [phone] between the hours of 8:30 a.m. and 4:30 p.m.  Voicemails left after 4:30 p.m. will not be returned until the following business day.  For prescription refill requests, have your pharmacy contact our office.       Resources For Cancer Patients and their Caregivers ? American Cancer Society: Can assist with transportation, wigs, general needs, runs Look Good Feel Better.        1-888-227-6333 ? Cancer Care: Provides financial assistance, online support groups, medication/co-pay assistance.  1-800-813-HOPE (4673) ? [facility] Assists [place] cancer patients and their families through emotional , educational and financial support.  336-427-4357 ? [place] DSS Where to apply for  food stamps, Medicaid and utility assistance. 336-342-1394 ? [facility]: Transportation to medical appointments. 336-347-2287 ? Social Security Administration: May apply for disability if have a Stage IV cancer. 336-342-7796 1-800-772-1213 ? [place] Aging, Disability and Transit Services: Assists with nutrition, care and transit needs. 336-349-2343  Cancer Center Support Programs:   > Cancer Support Group  2nd Tuesday of the month 1pm-2pm, Journey Room   > Creative Journey  3rd Tuesday of the month 1130am-1pm, Journey Room    

## 2017-11-15 NOTE — Progress Notes (Signed)
Diagnosis Malignant neoplasm of tongue ([place]) - Plan: CBC with Differential/Platelet, Comprehensive metabolic panel, Lactate dehydrogenase, Protime-INR, APTT  Staging Cancer Staging No matching staging information was found for the patient.  Assessment and Plan:  1.  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  Pt had PET scan done 08/02/2017 that was reviewed and showed IMPRESSION: 1. No evidence local head neck cancer recurrence. 2. No evidence of metastatic cervical lymphadenopathy. 3. No evidence of distant metastatic disease. 4. Stable post radiation change in the LEFT upper lobe.  Pt is set up to follow-up with Dr. Servando Snare and has scans ordered for 01/2018.  She will be seen for follow-up in 03/2018.    2.  Pulmonary nodule.  The patient is followed by  Dr. Servando Snare.  Pet scan that was done 08/02/2017 showed no evidence of metastatic cervical lymphadenopathy; No evidence of distant metastatic disease; Stable post radiation change in the LEFT upper lobe.  She reports she is scheduled for repeat imaging with Dr. Servando Snare in 01/2018.  Keep follow-up with his office as directed.  She will be seen for follow-up in 03/2018.    3  Dry mouth.  I have discussed with her this is due to prior therapy.  She has some white patches on tongue.  She is prescribed Diflucan 150 mg po x 1 and is advised to use Biotene mouth wash. She should notify the office if symptoms fail to improve.    4.  Trouble swallowing.  Pt should follow-up with GI as directed.    5.  Hypothyroidism.  Pt should follow-up with PCP for ongoing management.    6.  Leukopenia.  Labs done 11/15/2017 reviewed with pt and showed WBC slightly improved at 2.8.  Will repeat labs in 03/2018.  If no improvement or any worsening of counts, will consider bone marrow biopsy for definitive diagnosis.    7.  Thrombocytopenia.  Plt count has been decreased going back to 2017.  Labs done 11/15/2017 reviewd with pt and showed Plt count of 109,000.   Will repeat labs in 03/2018.    8  Health maintenance.  Pt reports she is scheduled for Dexa tomorrow.  Continue GI follow-up and mammogram screenings as recommended.    Interval history: Historical data obtained from note dated 07/16/2017.  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  Pt was diagnosed 01/2014; treated with concurrent chemoradiation; completed treatment on 04/03/14.  She reports she was seen by Dr. Benjamine Mola recently and had laryngoscopy performed that was negative.  She has noted evidence of a pulmonary nodule that was noted on PET scan done in June 2017.  Findings on PET are suspicious for an adenocarcinoma.  She reports she was being followed by Dr. Servando Snare.   Current status:   Patient is seen today for follow-up.  She is complaining of thrush on tongue.  She is scheduled for bone density tomorrow.  She continues to have trouble swallowing.      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      01/19/2014 PET scan    Tongue mass with 2 level II Right neck nodes T2N2b, Stage IVA      02/08/2014 - 04/03/2014 Radiation Therapy    Initiation of radiation therapy with 70 Gy delivered to the right base of tongue/tonsil 63 Gy to high risk nodal echelons, 56 Gy to the intermediate r base of tongue/tonsil/bilateral neck nodes  02/08/2014 - 03/15/2014 Chemotherapy    weekly cisplatin, only 4 doses given, severe toxicity with neutropenia, dehydration, nausea, vomiting, hospitalization and obstipation      10/24/2015 PET scan    low level non malig range hyper metab in L apical sub solid pulm nodule, suspicious for low grade adeno. diffuse tongue hypermetab without CT correlation      10/28/2015 Procedure    Bronchoscopy navigation, electromagnetic navigation, bronchoscopy with biopsy of LUL lung lesion, placement of fiducial markers X 3      10/28/2015 Pathology Results    Scant benign lung tissue, no tumor  seen        Problem List Patient Active Problem List   Diagnosis Date Noted  . Palpitations [R00.2] 05/18/2017  . Constipation [K59.00] 01/14/2017  . Esophageal dysphagia [R13.10] 01/13/2017  . Oral thrush [B37.0] 01/13/2017  . Nodule of left lung [R91.1] 10/17/2015  . Allergy to adhesive tape [Z91.048] 10/15/2015  . Diverticulitis large intestine [K57.32] 10/08/2015  . 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 Lung nodule, multiple [R91.8] 01/08/2015  . N&V (nausea and vomiting) [R11.2] 06/12/2014  . Hypotension, postural [I95.1] 06/12/2014  . Malignant neoplasm of tongue ([place]) [C02.9] 01/24/2014    Past Medical History Past Medical History:  Diagnosis Date  . Arthritis    lower back  . Constipation   . Diverticulitis   . Ectopic pregnancy 1977  . Esophageal cancer ([place]) 2015   tongue cancer, throat cancer  . GERD (gastroesophageal reflux disease)   . Headache    migraines  . Hypertension    no longer taking meds  . Hypokalemia 2015  . Hypothyroidism   . Lung nodule, multiple 01/08/2015  . Macular degeneration of right eye   . Malignant neoplasm of tongue ([place]) 01/24/2014  . Thyroid disease     Past Surgical History Past Surgical History:  Procedure Laterality Date  . BIOPSY PHARYNX  2015  . BREAST BIOPSY Bilateral    benign  . CHOLECYSTECTOMY  2005  . ESOPHAGOGASTRODUODENOSCOPY  06/19/2014   Dr. Britta Mccreedy: LA Class B esophagitis. PEG tube in place. erythema in proximal esophagus.   . ESOPHAGOGASTRODUODENOSCOPY (EGD) WITH PROPOFOL N/A 03/11/2017   Web in upper third of esophagus s/p dilation with scope passage, small hiatal hernia, normal duodenum, no specimens collected  . FUDUCIAL PLACEMENT N/A 10/28/2015   Procedure: PLACEMENT OF FUDUCIAL TIMES THREE; LEFT UPPER LOBE BIOPSY;  Surgeon: Grace Isaac, MD;  Location: [facility];  Service: Thoracic;  Laterality: N/A;  . MASTECTOMY, PARTIAL Left 1984   no cancer, fibrocystic breast disease  . SKIN SURGERY  1960    moles removed from various areas per patient report  . TONSILLECTOMY  1950   per patient report  . TUBAL LIGATION  1976  . VAGINAL HYSTERECTOMY  1981   vaginal hyst per patient report  . VIDEO BRONCHOSCOPY WITH ENDOBRONCHIAL NAVIGATION N/A 10/28/2015   Procedure: VIDEO BRONCHOSCOPY WITH ENDOBRONCHIAL NAVIGATION;  Surgeon: Grace Isaac, MD;  Location: [facility];  Service: Thoracic;  Laterality: N/A;    Family History History reviewed. No pertinent family history.   Social History  reports that she quit smoking about 35 years ago. Her smoking use included cigarettes. She has a 23.00 pack-year smoking history. She has never used smokeless tobacco. She reports that she does not drink alcohol or use drugs.  Medications  Current Outpatient Medications:  .  acetaminophen-codeine (TYLENOL #3) 300-30 MG tablet, Take 1 tablet by mouth every 6 (six) hours as needed for moderate pain.,  Disp: , Rfl:  .  docusate sodium (COLACE) 100 MG capsule, Take 100 mg by mouth at bedtime. , Disp: , Rfl:  .  levothyroxine (SYNTHROID) 88 MCG tablet, Take 1 tablet (88 mcg total) by mouth daily before breakfast., Disp: 90 tablet, Rfl: 2 .  metoprolol tartrate (LOPRESSOR) 25 MG tablet, Take 0.5 tablets (12.5 mg total) by mouth 2 (two) times daily., Disp: 90 tablet, Rfl: 3 .  ondansetron (ZOFRAN-ODT) 8 MG disintegrating tablet, Take 8 mg by mouth 3 (three) times daily. At 0700, 1500, and 2300, Disp: , Rfl:  .  fluconazole (DIFLUCAN) 40 MG/ML suspension, Take 3.8 mLs (152 mg total) by mouth once for 1 dose., Disp: 35 mL, Rfl: 0  Allergies Adhesive [tape] and Latex  Review of Systems Review of Systems - Oncology ROS negative other than thrush on tongue and trouble swallowing     Physical Exam  Vitals Wt Readings from Last 3 Encounters:  11/15/17 169 lb 14.4 oz (77.1 kg)  08/26/17 169 lb (76.7 kg)  08/10/17 169 lb 12.8 oz (77 kg)   Temp Readings from Last 3 Encounters:  11/15/17 97.7 F (36.5 C) (Oral)   07/16/17 98 F (36.7 C) (Oral)  05/12/17 97.6 F (36.4 C) (Oral)   BP Readings from Last 3 Encounters:  11/15/17 94/65  08/26/17 100/70  08/10/17 (!) 150/70   Pulse Readings from Last 3 Encounters:  11/15/17 88  08/26/17 61  08/10/17 62   Constitutional: Well-developed, well-nourished, and in no distress.   HENT: Head: Normocephalic and atraumatic.  Mouth/Throat: No oropharyngeal exudate. Mucosa moist. Eyes: Pupils are equal, round, and reactive to light. Conjunctivae are normal. No scleral icterus.  Neck: Normal range of motion. Neck supple. No JVD present.  Cardiovascular: Normal rate, regular rhythm and normal heart sounds.  Exam reveals no gallop and no friction rub.   No murmur heard. Pulmonary/Chest: Effort normal and breath sounds normal. No respiratory distress. No wheezes.No rales.  Abdominal: Soft. Bowel sounds are normal. No distension. There is no tenderness. There is no guarding.  Musculoskeletal: No edema or tenderness.  Lymphadenopathy: No cervical, axillary or supraclavicular adenopathy.  Neurological: Alert and oriented to person, place, and time. No cranial nerve deficit.  Skin: Skin is warm and dry. No rash noted. No erythema. No pallor.  Psychiatric: Affect and judgment normal.   Labs Appointment on 11/15/2017  Component Date Value Ref Range Status  . WBC 11/15/2017 2.8* 4.0 - 10.5 K/uL Final  . RBC 11/15/2017 4.58  3.87 - 5.11 MIL/uL Final  . Hemoglobin 11/15/2017 13.9  12.0 - 15.0 g/dL Final  . HCT 11/15/2017 40.9  36.0 - 46.0 % Final  . MCV 11/15/2017 89.3  78.0 - 100.0 fL Final  . MCH 11/15/2017 30.3  26.0 - 34.0 pg Final  . MCHC 11/15/2017 34.0  30.0 - 36.0 g/dL Final  . RDW 11/15/2017 13.0  11.5 - 15.5 % Final  . Platelets 11/15/2017 109* 150 - 400 K/uL Final   Comment: SPECIMEN CHECKED FOR CLOTS PLATELET COUNT CONFIRMED BY SMEAR   . Neutrophils Relative % 11/15/2017 68  % Final  . Neutro Abs 11/15/2017 1.9  1.7 - 7.7 K/uL Final  .  Lymphocytes Relative 11/15/2017 20  % Final  . Lymphs Abs 11/15/2017 0.6* 0.7 - 4.0 K/uL Final  . Monocytes Relative 11/15/2017 9  % Final  . Monocytes Absolute 11/15/2017 0.2  0.1 - 1.0 K/uL Final  . Eosinophils Relative 11/15/2017 2  % Final  . Eosinophils Absolute 11/15/2017  0.1  0.0 - 0.7 K/uL Final  . Basophils Relative 11/15/2017 1  % Final  . Basophils Absolute 11/15/2017 0.0  0.0 - 0.1 K/uL Final   Performed at [facility], [address] [place], [place] [address]  . Sodium 11/15/2017 140  135 - 145 mmol/L Final  . Potassium 11/15/2017 3.8  3.5 - 5.1 mmol/L Final  . Chloride 11/15/2017 106  98 - 111 mmol/L Final   Please note change in reference range.  . CO2 11/15/2017 29  22 - 32 mmol/L Final  . Glucose, Bld 11/15/2017 104* 70 - 99 mg/dL Final   Please note change in reference range.  . BUN 11/15/2017 25* 8 - 23 mg/dL Final   Please note change in reference range.  . Creatinine, Ser 11/15/2017 0.96  0.44 - 1.00 mg/dL Final  . Calcium 11/15/2017 9.2  8.9 - 10.3 mg/dL Final  . Total Protein 11/15/2017 6.7  6.5 - 8.1 g/dL Final  . Albumin 11/15/2017 3.8  3.5 - 5.0 g/dL Final  . AST 11/15/2017 19  15 - 41 U/L Final  . ALT 11/15/2017 13  0 - 44 U/L Final   Please note change in reference range.  . Alkaline Phosphatase 11/15/2017 45  38 - 126 U/L Final  . Total Bilirubin 11/15/2017 0.8  0.3 - 1.2 mg/dL Final  . GFR calc non Af Amer 11/15/2017 56* >60 mL/min Final  . GFR calc Af Amer 11/15/2017 >60  >60 mL/min Final   Comment: (NOTE) The eGFR has been calculated using the CKD EPI equation. This calculation has not been validated in all clinical situations. eGFR's persistently <60 mL/min signify possible Chronic Kidney Disease.   Georgiann Hahn gap 11/15/2017 5  5 - 15 Final   Performed at [facility], [address], [place], [place] [address]  . LDH 11/15/2017 139  98 - 192 U/L Final   Performed at [facility], [address], [place], [place] [address]     Pathology Orders  Placed This Encounter  Procedures  . CBC with Differential/Platelet    Standing Status:   Future    Standing Expiration Date:   11/16/2018  . Comprehensive metabolic panel    Standing Status:   Future    Standing Expiration Date:   11/16/2018  . Lactate dehydrogenase    Standing Status:   Future    Standing Expiration Date:   11/16/2018  . Protime-INR    Standing Status:   Future    Standing Expiration Date:   11/16/2018  . APTT    Standing Status:   Future    Standing Expiration Date:   11/16/2018       Zoila Shutter MD

## 2017-12-03 ENCOUNTER — Encounter: Payer: Self-pay | Admitting: Cardiovascular Disease

## 2017-12-03 ENCOUNTER — Ambulatory Visit: Payer: Medicare Other | Admitting: Cardiovascular Disease

## 2017-12-03 VITALS — BP 130/78 | HR 64 | Ht 67.5 in | Wt 170.8 lb

## 2017-12-03 DIAGNOSIS — R000 Tachycardia, unspecified: Secondary | ICD-10-CM | POA: Diagnosis not present

## 2017-12-03 DIAGNOSIS — R42 Dizziness and giddiness: Secondary | ICD-10-CM | POA: Diagnosis not present

## 2017-12-03 DIAGNOSIS — I700 Atherosclerosis of aorta: Secondary | ICD-10-CM | POA: Diagnosis not present

## 2017-12-03 DIAGNOSIS — R002 Palpitations: Secondary | ICD-10-CM | POA: Diagnosis not present

## 2017-12-03 NOTE — Progress Notes (Signed)
SUBJECTIVE: Patient presents for follow-up of palpitations.  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  Echocardiogram 07/07/2017 showed normal left ventricular systolic function and normal regional wall motion, LVEF 55 to 60%.  I started metoprolol 25 mg twice daily at a prior visit.  She developed dizziness so I reduced the dose to 12.5 mg twice daily at her last visit on 08/26/2017.  She continues to experience dizziness but denies syncope and falls.  She denies chest pain, palpitations, leg swelling, and shortness of breath.  She brought in a blood pressure log which I reviewed.  Heart rate and blood pressure have been routinely normal.     Review of Systems: As per "subjective", otherwise negative.  Allergies  Allergen Reactions  . Adhesive [Tape] Rash    Reports rash and blistering if any adhesive tape  . Latex Rash    Rash and skin blistering per patient report    Current Outpatient Medications  Medication Sig Dispense Refill  . acetaminophen-codeine (TYLENOL #3) 300-30 MG tablet Take 1 tablet by mouth every 6 (six) hours as needed for moderate pain.    Marland Kitchen dexlansoprazole (DEXILANT) 60 MG capsule Take 60 mg by mouth once a week.    . docusate sodium (COLACE) 100 MG capsule Take 100 mg by mouth at bedtime.     Marland Kitchen levothyroxine (SYNTHROID) 88 MCG tablet Take 1 tablet (88 mcg total) by mouth daily before breakfast. 90 tablet 2  . metoprolol tartrate (LOPRESSOR) 25 MG tablet Take 0.5 tablets (12.5 mg total) by mouth 2 (two) times daily. 90 tablet 3  . ondansetron (ZOFRAN-ODT) 8 MG disintegrating tablet Take 8 mg by mouth 3 (three) times daily. At 0700, 1500, and 2300     No current facility-administered medications for this visit.     Past Medical History:  Diagnosis Date  . Arthritis    lower back  . Constipation   . Diverticulitis   . Ectopic pregnancy 1977  . 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 GERD (gastroesophageal reflux  disease)   . Headache    migraines  . Hypertension    no longer taking meds  . Hypokalemia 2015  . Hypothyroidism   . Lung nodule, multiple 01/08/2015  . Macular degeneration of right eye   . 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 Thyroid disease     Past Surgical History:  Procedure Laterality Date  . BIOPSY PHARYNX  2015  . BREAST BIOPSY Bilateral    benign  . CHOLECYSTECTOMY  2005  . ESOPHAGOGASTRODUODENOSCOPY  06/19/2014   Dr. Britta Mccreedy: LA Class B esophagitis. PEG tube in place. erythema in proximal esophagus.   . ESOPHAGOGASTRODUODENOSCOPY (EGD) WITH PROPOFOL N/A 03/11/2017   Web in upper third of esophagus s/p dilation with scope passage, small hiatal hernia, normal duodenum, no specimens collected  . FUDUCIAL PLACEMENT N/A 10/28/2015   Procedure: PLACEMENT OF FUDUCIAL TIMES THREE; LEFT UPPER LOBE BIOPSY;  Surgeon: Grace Isaac, MD;  Location: [facility];  Service: Thoracic;  Laterality: N/A;  . MASTECTOMY, PARTIAL Left 1984   no cancer, fibrocystic breast disease  . SKIN SURGERY  1960   moles removed from various areas per patient report  . TONSILLECTOMY  1950   per patient report  . TUBAL LIGATION  1976  . VAGINAL HYSTERECTOMY  1981   vaginal hyst per patient report  . VIDEO BRONCHOSCOPY WITH ENDOBRONCHIAL NAVIGATION N/A 10/28/2015   Procedure: VIDEO BRONCHOSCOPY WITH ENDOBRONCHIAL NAVIGATION;  Surgeon: Grace Isaac, MD;  Location: [facility];  Service: Thoracic;  Laterality: N/A;    Social History   Socioeconomic History  . Marital status: Married    Spouse name: Not on file  . Number of children: Not on file  . Years of education: Not on file  . Highest education level: Not on file  Occupational History  . Not on file  Social Needs  . Financial resource strain: Not on file  . Food insecurity:    Worry: Not on file    Inability: Not on file  . Transportation needs:    Medical: Not on file    Non-medical: Not on file  Tobacco Use  . Smoking status:  Former Smoker    Packs/day: 1.00    Years: 23.00    Pack years: 23.00    Types: Cigarettes    Last attempt to quit: 05/03/1982    Years since quitting: 35.6  . Smokeless tobacco: Never Used  Substance and Sexual Activity  . Alcohol use: No    Alcohol/week: 0.0 oz  . Drug use: No  . Sexual activity: Never    Birth control/protection: None  Lifestyle  . Physical activity:    Days per week: Not on file    Minutes per session: Not on file  . Stress: Not on file  Relationships  . Social connections:    Talks on phone: Not on file    Gets together: Not on file    Attends religious service: Not on file    Active member of club or organization: Not on file    Attends meetings of clubs or organizations: Not on file    Relationship status: Not on file  . Intimate partner violence:    Fear of current or ex partner: Not on file    Emotionally abused: Not on file    Physically abused: Not on file    Forced sexual activity: Not on file  Other Topics Concern  . Not on file  Social History Narrative  . Not on file     Vitals:   12/03/17 1507  BP: 130/78  Pulse: 64  SpO2: 98%  Weight: 170 lb 12.8 oz (77.5 kg)  Height: 5' 7.5" (1.715 m)    Wt Readings from Last 3 Encounters:  12/03/17 170 lb 12.8 oz (77.5 kg)  11/15/17 169 lb 14.4 oz (77.1 kg)  08/26/17 169 lb (76.7 kg)     PHYSICAL EXAM General: NAD HEENT: Normal. Neck: No JVD, no thyromegaly. Lungs: Clear to auscultation bilaterally with normal respiratory effort. CV: Regular rate and rhythm, normal S1/S2, no S3/S4, no murmur. No pretibial or periankle edema.     Abdomen: Soft, nontender, no distention.  Neurologic: Alert and oriented.  Psych: Normal affect. Skin: Normal. Musculoskeletal: No gross deformities.    ECG: Reviewed above under Subjective   Labs: Lab Results  Component Value Date/Time   K 3.8 11/15/2017 01:01 PM   BUN 25 (H) 11/15/2017 01:01 PM   CREATININE 0.96 11/15/2017 01:01 PM   ALT 13  11/15/2017 01:01 PM   TSH 2.146 07/16/2017 01:49 PM   HGB 13.9 11/15/2017 01:01 PM     Lipids: No results found for: LDLCALC, LDLDIRECT, CHOL, TRIG, HDL     ASSESSMENT AND PLAN:  1.  Palpitations/inappropriate sinus tachycardia with dizziness: Symptomatically improved on metoprolol tartrate 12.5 mg twice daily.  2.  Aortic atherosclerosis: This was an incidental finding on her most recent PET scan on 08/02/2017 which I  previously reviewed with her.  No need for further cardiovascular investigations at this time as she has no symptoms to suggest ischemic heart disease.     Disposition: Follow up 6 months   Kate Sable, M.D., F.A.C.C.

## 2017-12-03 NOTE — Patient Instructions (Signed)

## 2017-12-16 ENCOUNTER — Ambulatory Visit: Payer: Medicare Other | Admitting: Gastroenterology

## 2018-01-12 ENCOUNTER — Other Ambulatory Visit: Payer: Self-pay | Admitting: *Deleted

## 2018-01-12 DIAGNOSIS — R911 Solitary pulmonary nodule: Secondary | ICD-10-CM

## 2018-01-27 ENCOUNTER — Ambulatory Visit (INDEPENDENT_AMBULATORY_CARE_PROVIDER_SITE_OTHER): Payer: Medicare Other | Admitting: Otolaryngology

## 2018-01-27 DIAGNOSIS — K1121 Acute sialoadenitis: Secondary | ICD-10-CM

## 2018-01-27 DIAGNOSIS — H9209 Otalgia, unspecified ear: Secondary | ICD-10-CM | POA: Diagnosis not present

## 2018-01-27 DIAGNOSIS — Z85819 Personal history of malignant neoplasm of unspecified site of lip, oral cavity, and pharynx: Secondary | ICD-10-CM

## 2018-02-07 ENCOUNTER — Other Ambulatory Visit (HOSPITAL_COMMUNITY): Payer: Self-pay | Admitting: *Deleted

## 2018-02-07 DIAGNOSIS — N63 Unspecified lump in unspecified breast: Secondary | ICD-10-CM

## 2018-02-08 ENCOUNTER — Ambulatory Visit (HOSPITAL_COMMUNITY)
Admission: RE | Admit: 2018-02-08 | Discharge: 2018-02-08 | Disposition: A | Discharge status: Home / Self Care | Payer: Medicare Other | Source: Ambulatory Visit | Attending: *Deleted | Admitting: *Deleted

## 2018-02-08 DIAGNOSIS — N63 Unspecified lump in unspecified breast: Secondary | ICD-10-CM | POA: Insufficient documentation

## 2018-02-09 ENCOUNTER — Other Ambulatory Visit (HOSPITAL_COMMUNITY): Payer: Self-pay | Admitting: *Deleted

## 2018-02-09 DIAGNOSIS — N6001 Solitary cyst of right breast: Secondary | ICD-10-CM

## 2018-02-10 ENCOUNTER — Encounter: Payer: Medicare Other | Admitting: Cardiothoracic Surgery

## 2018-02-10 ENCOUNTER — Other Ambulatory Visit: Payer: Medicare Other

## 2018-02-15 ENCOUNTER — Ambulatory Visit (HOSPITAL_COMMUNITY)
Admission: RE | Admit: 2018-02-15 | Discharge: 2018-02-15 | Disposition: A | Discharge status: Home / Self Care | Payer: Medicare Other | Source: Ambulatory Visit | Attending: *Deleted | Admitting: *Deleted

## 2018-02-15 ENCOUNTER — Encounter (HOSPITAL_COMMUNITY): Payer: Self-pay

## 2018-02-15 DIAGNOSIS — N6001 Solitary cyst of right breast: Secondary | ICD-10-CM | POA: Insufficient documentation

## 2018-02-15 MED ORDER — LIDOCAINE HCL (PF) 1 % IJ SOLN
INTRAMUSCULAR | Status: AC
  Administered 2018-02-15: 5 mL
  Filled 2018-02-15: qty 5

## 2018-02-15 MED ORDER — LIDOCAINE-EPINEPHRINE (PF) 1 %-1:200000 IJ SOLN
INTRAMUSCULAR | Status: AC
  Administered 2018-02-15: 5 mL
  Filled 2018-02-15: qty 30

## 2018-03-08 ENCOUNTER — Other Ambulatory Visit (HOSPITAL_COMMUNITY): Payer: Medicare Other

## 2018-03-15 ENCOUNTER — Ambulatory Visit (HOSPITAL_COMMUNITY): Payer: Medicare Other | Admitting: Hematology

## 2018-03-16 ENCOUNTER — Inpatient Hospital Stay (HOSPITAL_COMMUNITY): Payer: Medicare Other | Attending: Hematology

## 2018-03-16 DIAGNOSIS — Z79899 Other long term (current) drug therapy: Secondary | ICD-10-CM | POA: Diagnosis not present

## 2018-03-16 DIAGNOSIS — D696 Thrombocytopenia, unspecified: Secondary | ICD-10-CM | POA: Diagnosis not present

## 2018-03-16 DIAGNOSIS — Z87891 Personal history of nicotine dependence: Secondary | ICD-10-CM | POA: Diagnosis not present

## 2018-03-16 DIAGNOSIS — R918 Other nonspecific abnormal finding of lung field: Secondary | ICD-10-CM | POA: Diagnosis not present

## 2018-03-16 DIAGNOSIS — C029 Malignant neoplasm of tongue, unspecified: Secondary | ICD-10-CM | POA: Diagnosis present

## 2018-03-16 DIAGNOSIS — R1310 Dysphagia, unspecified: Secondary | ICD-10-CM | POA: Insufficient documentation

## 2018-03-16 DIAGNOSIS — I10 Essential (primary) hypertension: Secondary | ICD-10-CM | POA: Diagnosis not present

## 2018-03-16 DIAGNOSIS — Z9221 Personal history of antineoplastic chemotherapy: Secondary | ICD-10-CM | POA: Insufficient documentation

## 2018-03-16 DIAGNOSIS — R002 Palpitations: Secondary | ICD-10-CM | POA: Insufficient documentation

## 2018-03-16 DIAGNOSIS — D72818 Other decreased white blood cell count: Secondary | ICD-10-CM | POA: Insufficient documentation

## 2018-03-16 DIAGNOSIS — Z923 Personal history of irradiation: Secondary | ICD-10-CM | POA: Insufficient documentation

## 2018-03-16 DIAGNOSIS — E039 Hypothyroidism, unspecified: Secondary | ICD-10-CM | POA: Insufficient documentation

## 2018-03-16 LAB — CBC WITH DIFFERENTIAL/PLATELET
Abs Immature Granulocytes: 0 10*3/uL (ref 0.00–0.07)
Basophils Absolute: 0 10*3/uL (ref 0.0–0.1)
Basophils Relative: 1 %
Eosinophils Absolute: 0.1 10*3/uL (ref 0.0–0.5)
Eosinophils Relative: 3 %
HCT: 43.7 % (ref 36.0–46.0)
Hemoglobin: 14.7 g/dL (ref 12.0–15.0)
Immature Granulocytes: 0 %
Lymphocytes Relative: 25 %
Lymphs Abs: 0.5 10*3/uL — ABNORMAL LOW (ref 0.7–4.0)
MCH: 29.7 pg (ref 26.0–34.0)
MCHC: 33.6 g/dL (ref 30.0–36.0)
MCV: 88.3 fL (ref 80.0–100.0)
Monocytes Absolute: 0.2 10*3/uL (ref 0.1–1.0)
Monocytes Relative: 13 %
Neutro Abs: 1.1 10*3/uL — ABNORMAL LOW (ref 1.7–7.7)
Neutrophils Relative %: 58 %
Platelets: 112 10*3/uL — ABNORMAL LOW (ref 150–400)
RBC: 4.95 MIL/uL (ref 3.87–5.11)
RDW: 12.6 % (ref 11.5–15.5)
WBC: 1.8 10*3/uL — ABNORMAL LOW (ref 4.0–10.5)
nRBC: 0 % (ref 0.0–0.2)

## 2018-03-16 LAB — COMPREHENSIVE METABOLIC PANEL
ALT: 13 U/L (ref 0–44)
AST: 22 U/L (ref 15–41)
Albumin: 4.2 g/dL (ref 3.5–5.0)
Alkaline Phosphatase: 51 U/L (ref 38–126)
Anion gap: 6 (ref 5–15)
BUN: 20 mg/dL (ref 8–23)
CO2: 26 mmol/L (ref 22–32)
Calcium: 9.4 mg/dL (ref 8.9–10.3)
Chloride: 106 mmol/L (ref 98–111)
Creatinine, Ser: 0.83 mg/dL (ref 0.44–1.00)
GFR calc Af Amer: >60 mL/min (ref >60)
GFR calc non Af Amer: >60 mL/min (ref >60)
Glucose, Bld: 98 mg/dL (ref 70–99)
Potassium: 3.8 mmol/L (ref 3.5–5.1)
Sodium: 138 mmol/L (ref 135–145)
Total Bilirubin: 1.1 mg/dL (ref 0.3–1.2)
Total Protein: 7.1 g/dL (ref 6.5–8.1)

## 2018-03-16 LAB — PROTIME-INR
INR: 0.95
Prothrombin Time: 12.6 seconds (ref 11.4–15.2)

## 2018-03-16 LAB — LACTATE DEHYDROGENASE: LDH: 142 U/L (ref 98–192)

## 2018-03-16 LAB — APTT: aPTT: 30 seconds (ref 24–36)

## 2018-03-17 IMAGING — CT CT MAXILLOFACIAL W/ CM
3 of 8 series · 13 of 47 positions shown, 16 images · IV contrast (Isovue)
Comparison: Neck CT 06/13/2014.

CLINICAL DATA: History of stage IV squamous cell carcinoma of the
tongue. New right temporal pain radiating to the right neck.

EXAM:
CT NECK WITH CONTRAST
TECHNIQUE: Multidetector CT imaging of the neck was performed using the
standard protocol following the bolus administration of intravenous
contrast. Maxillofacial imaging was also requested and performed.
These areas overlap and the patient should only be charged for a
neck CT with contrast.
CONTRAST:  80mL [id number] IOPAMIDOL ([id number]) INJECTION 61%

[Series 5: sagittal soft · sagittal · 0.31mm/px · 1 of 84 slices shown]
[im 42/84  bone]
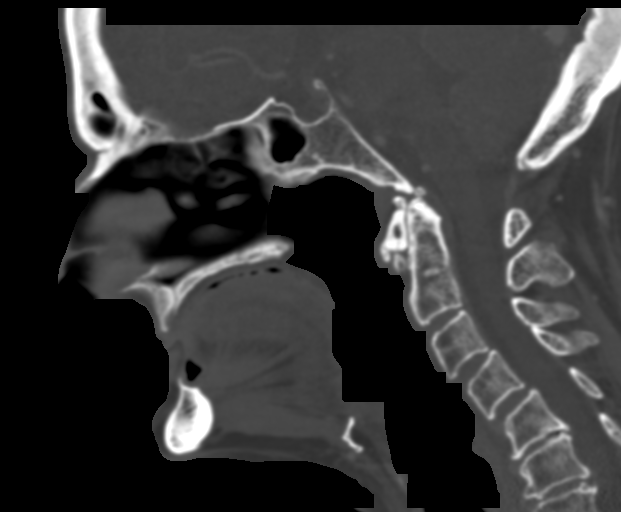

[Series 11: coronal st neck · coronal · 0.34mm/px · 3 of 115 slices shown]
[im 23/115  bone]
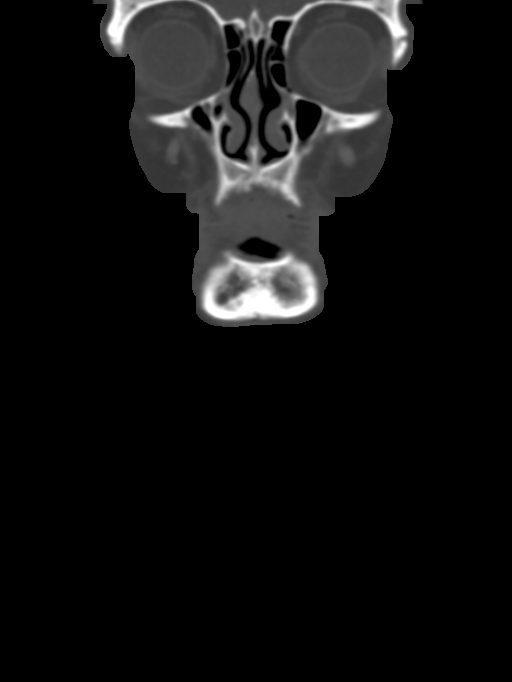
[im 46/115  bone]
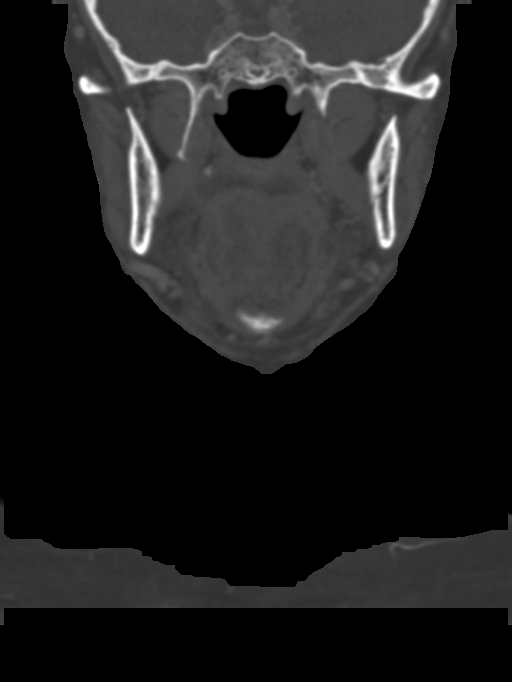
[im 69/115  bone]
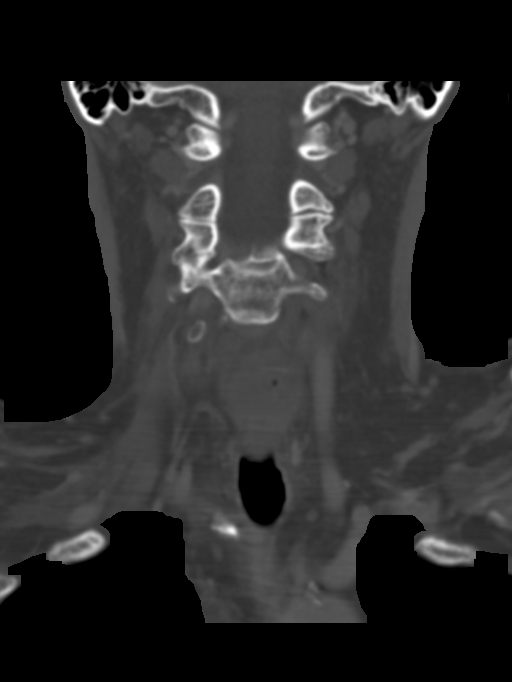

[Series 13: orthoganal axal st neck · axial · 0.44mm/px · z∈[+1412,+1589]mm · 9 of 114 slices shown, 12 images]
[im 12/114  brain]
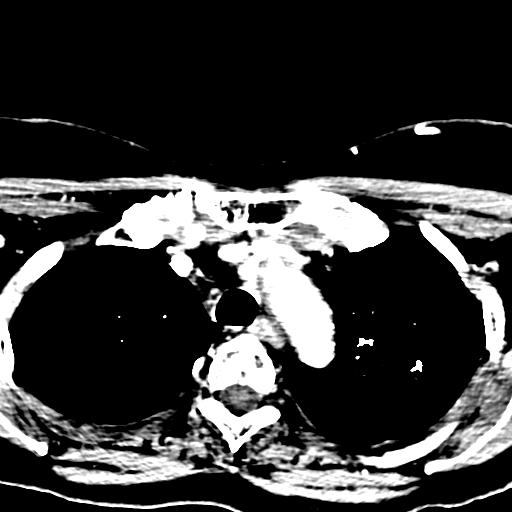
[im 12/114  bone]
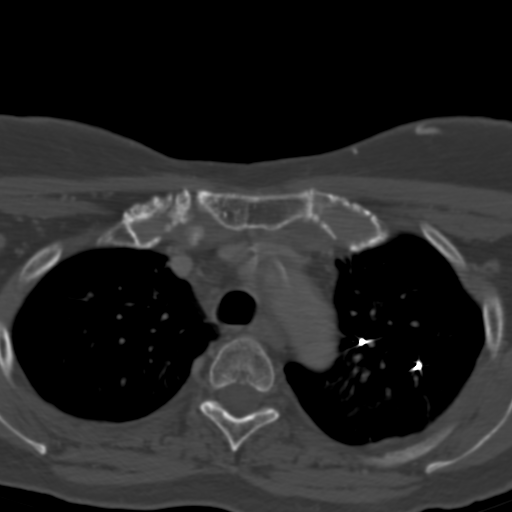
[im 23/114  bone]
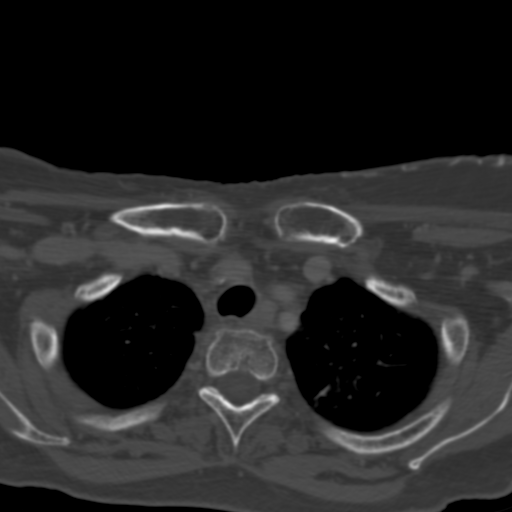
[im 34/114  bone]
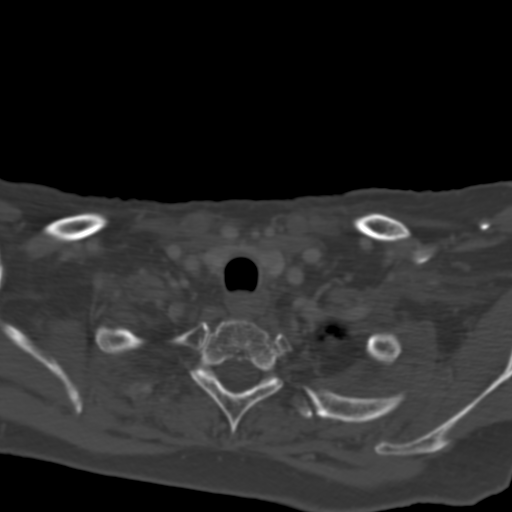
[im 46/114  bone]
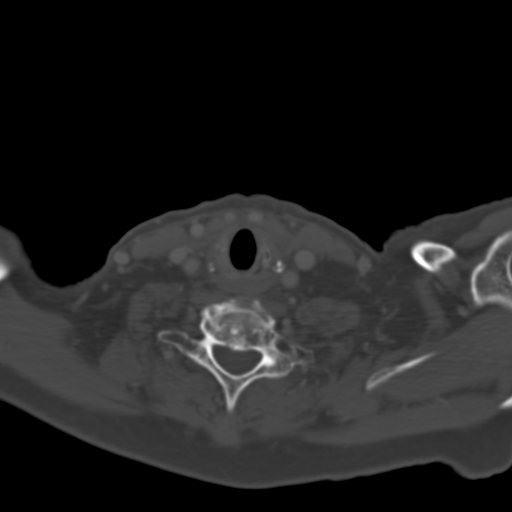
[im 57/114  brain]
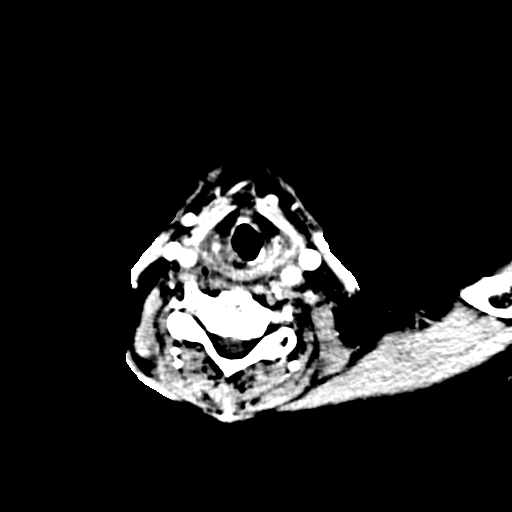
[im 57/114  bone]
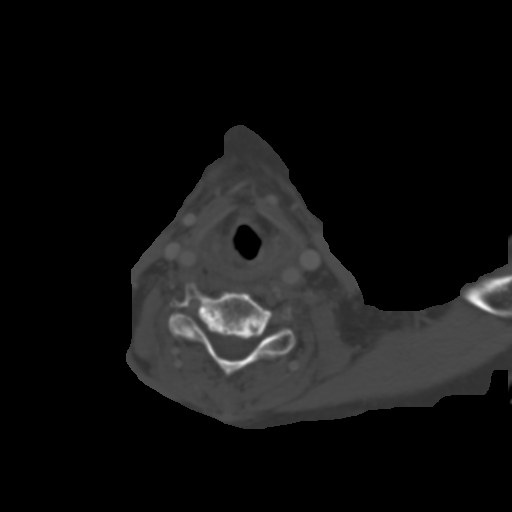
[im 68/114  bone]
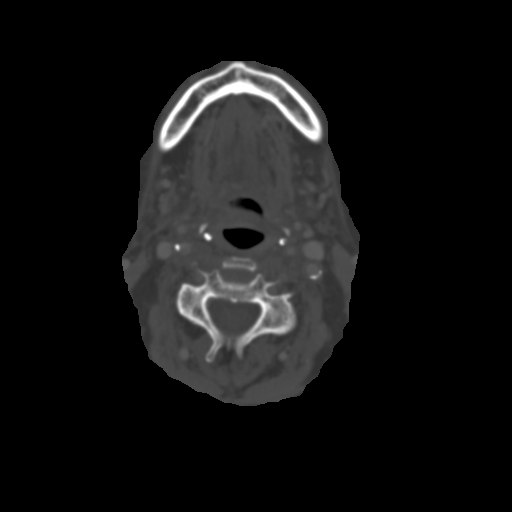
[im 80/114  bone]
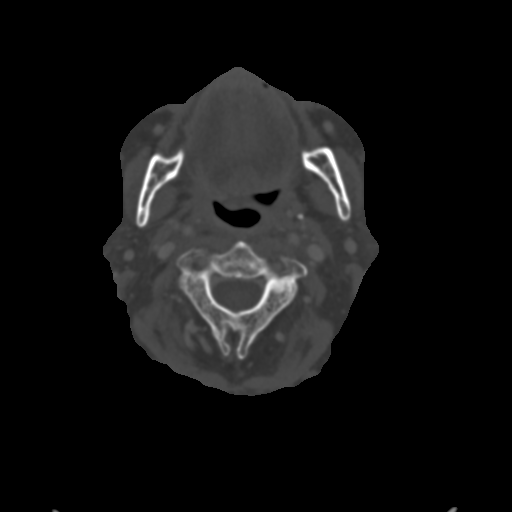
[im 91/114  bone]
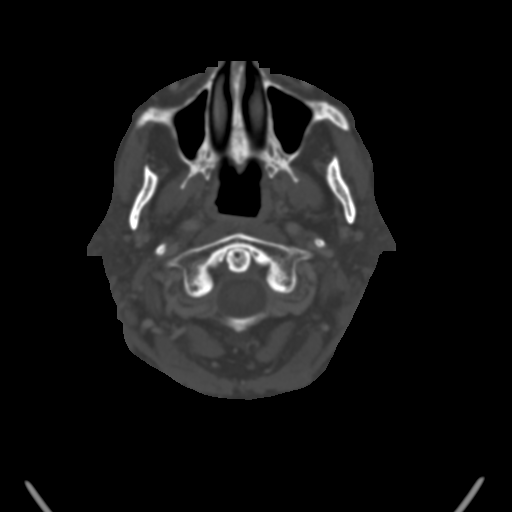
[im 102/114  brain]
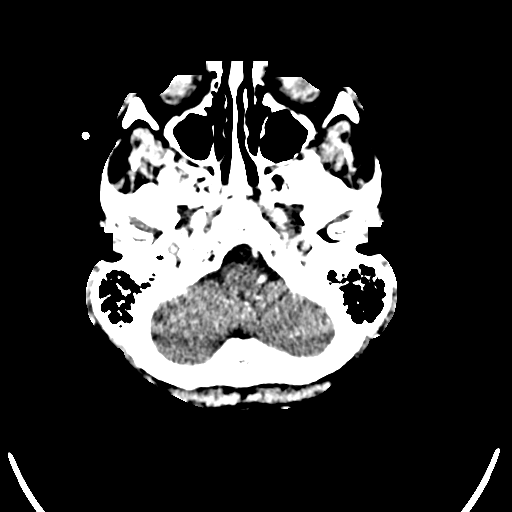
[im 102/114  bone]
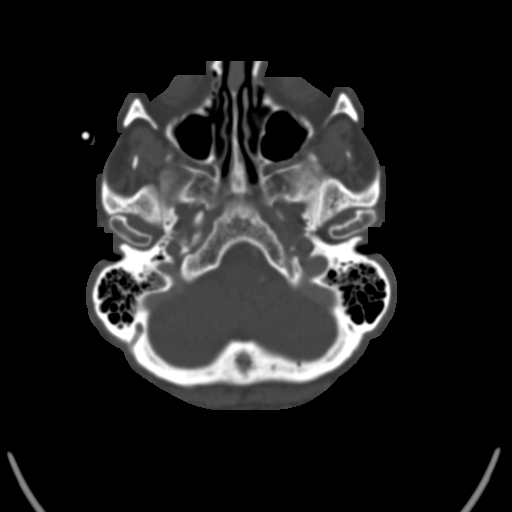

[13 of 47 positions shown; findings below may reference images not displayed]

FINDINGS: Pharynx and larynx: There is submucosal low density attributed to
treatment. There is asymmetric oropharynx with non masslike
distortion of soft tissues in the right tonsil fossa, the site of
treated primary. This has the appearance of scarring, but needs
correlation with direct visualization. No deep masslike area.

Salivary glands: Hyper enhancement seen previously has subsided.
There is fatty atrophy of bilateral major salivary glands. No
masslike finding.

Thyroid: Atrophic.

Lymph nodes: None enlarged or abnormal density.

Vascular: Atherosclerosis with at least moderate plaque at the
carotid bifurcations. There is plaque at the great vessel origins,
detection of stenosis limited by the motion artifact. A porta
catheter has been removed.

Limited intracranial: Negative

Visualized orbits: Negative.

Mastoids and visualized paranasal sinuses: Clear

Skeleton: No acute or aggressive finding. No evidence of
osteonecrosis. The patient is edentulous with alveolar ridge
atrophy.

Upper chest: Ground-glass nodule in the left upper lobe with
fiducial markers. Biapical pulmonary radiation fibrosis. Status post
chest CT 02/18/2017.

Other: Palpable markers overlap the right temporal fossa which is
negative for mass or inflammation. There is symmetric atrophy of
muscles of mastication.
IMPRESSION: 1. No specific explanation for right temporal pain. Pain markers
overlap the temporal fossa that is negative for mass or
inflammation.
2. Post treatment neck without definite residual or recurrent
disease. There is architectural distortion in the right tonsillar
fossa, the site of patient's primary tumor. This is likely scarring,
recommend correlation with visual inspection. No deep soft tissue
mass or adenopathy.
3. Changes of prior treatment. Progressive cervical carotid
atherosclerosis.
4. Postcontrast neck and maxillofacial CT was performed as
requested. These areas overlap and the patient should only be
charged for a neck CT.

## 2018-03-23 ENCOUNTER — Other Ambulatory Visit: Payer: Self-pay

## 2018-03-23 ENCOUNTER — Inpatient Hospital Stay (HOSPITAL_COMMUNITY): Payer: Medicare Other | Admitting: Internal Medicine

## 2018-03-23 ENCOUNTER — Encounter (HOSPITAL_COMMUNITY): Payer: Self-pay | Admitting: Internal Medicine

## 2018-03-23 ENCOUNTER — Inpatient Hospital Stay (HOSPITAL_COMMUNITY): Payer: Medicare Other

## 2018-03-23 VITALS — BP 129/67 | HR 94 | Temp 98.2°F | Resp 14 | Wt 170.0 lb

## 2018-03-23 DIAGNOSIS — D696 Thrombocytopenia, unspecified: Secondary | ICD-10-CM | POA: Diagnosis not present

## 2018-03-23 DIAGNOSIS — Z9221 Personal history of antineoplastic chemotherapy: Secondary | ICD-10-CM

## 2018-03-23 DIAGNOSIS — D72818 Other decreased white blood cell count: Secondary | ICD-10-CM | POA: Diagnosis not present

## 2018-03-23 DIAGNOSIS — R918 Other nonspecific abnormal finding of lung field: Secondary | ICD-10-CM

## 2018-03-23 DIAGNOSIS — C029 Malignant neoplasm of tongue, unspecified: Secondary | ICD-10-CM

## 2018-03-23 DIAGNOSIS — Z923 Personal history of irradiation: Secondary | ICD-10-CM | POA: Diagnosis not present

## 2018-03-23 DIAGNOSIS — I10 Essential (primary) hypertension: Secondary | ICD-10-CM

## 2018-03-23 DIAGNOSIS — R1310 Dysphagia, unspecified: Secondary | ICD-10-CM

## 2018-03-23 DIAGNOSIS — R002 Palpitations: Secondary | ICD-10-CM

## 2018-03-23 DIAGNOSIS — E039 Hypothyroidism, unspecified: Secondary | ICD-10-CM

## 2018-03-23 NOTE — Patient Instructions (Signed)
[facility] at [facility]  Discharge Instructions: You saw Dr. Higgs today                               _______________________________________________________________  Thank you for choosing [facility] at [facility] to provide your oncology and hematology care.  To afford each patient quality time with our providers, please arrive at least 15 minutes before your scheduled appointment.  You need to re-schedule your appointment if you arrive 10 or more minutes late.  We strive to give you quality time with our providers, and arriving late affects you and other patients whose appointments are after yours.  Also, if you no show three or more times for appointments you may be dismissed from the clinic.  Again, thank you for choosing [facility] at [facility]. Our hope is that these requests will allow you access to exceptional care and in a timely manner. _______________________________________________________________  If you have questions after your visit, please contact our office at [phone] between the hours of 8:30 a.m. and 5:00 p.m. Voicemails left after 4:30 p.m. will not be returned until the following business day. _______________________________________________________________  For prescription refill requests, have your pharmacy contact our office. _______________________________________________________________  Recommendations made by the consultant and any test results will be sent to your referring physician. _______________________________________________________________ 

## 2018-03-23 NOTE — Progress Notes (Signed)
Diagnosis Malignant neoplasm of tongue (HCC) - Plan: Hepatitis B surface antibody, Hepatitis B surface antigen, Hepatitis B core antibody, total, Hepatitis panel, acute, Hepatitis C RNA quantitative, HIV antibody (with reflex), CT BONE MARROW BIOPSY & ASPIRATION, CT BIOPSY  Other decreased white blood cell (WBC) count - Plan: Hepatitis B surface antibody, Hepatitis B surface antigen, Hepatitis B core antibody, total, Hepatitis panel, acute, Hepatitis C RNA quantitative, HIV antibody (with reflex), CT BONE MARROW BIOPSY & ASPIRATION, CT BIOPSY  Thrombocytopenia (HCC) - Plan: Hepatitis B surface antibody, Hepatitis B surface antigen, Hepatitis B core antibody, total, Hepatitis panel, acute, Hepatitis C RNA quantitative, HIV antibody (with reflex), CT BONE MARROW BIOPSY & ASPIRATION, CT BIOPSY  Staging Cancer Staging No matching staging information was found for the patient.  Assessment and Plan:  1.  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  Pt had PET scan done 08/02/2017 that showed IMPRESSION: 1. No evidence local head neck cancer recurrence. 2. No evidence of metastatic cervical lymphadenopathy. 3. No evidence of distant metastatic disease. 4. Stable post radiation change in the LEFT upper lobe.  She should continue to follow-up with Dr. Servando Snare as directed.  Pt will be set up for PET scan in 08/2018 and will follow-up at that time to go over results.   2.  Pulmonary nodule.  The patient is followed by  Dr. Servando Snare.  Pet scan that was done 08/02/2017 showed no evidence of metastatic cervical lymphadenopathy; No evidence of distant metastatic disease; Stable post radiation change in the LEFT upper lobe.  She should follow-up with Dr. Servando Snare as directed.  Pt will be set up for PET scan in 08/2018 for ongoing monitoring of pulmonary nodule.    3  Dry mouth and white patch on tongue.  I have discussed with her this is due to prior therapy.  She has some white patches on tongue.  She was  prescribed Diflucan 150 mg po x 1 and advised to use Biotene mouth wash. She reports no improvement.  Pt should continue to follow-up with ENT.  I have again discussed with her findings are likely due to diagnosis and prior therapy.  .  4.  Trouble swallowing.  Follow-up with GI as directed.    5.  Hypothyroidism.  Follow-up with PCP for ongoing management.    6.  Leukopenia.  Labs done 11/15/2017 reviewed with pt and showed WBC slightly improved at 2.8.  Labs done 03/16/2018 reviewed and showed WBC 1.8 HB 14.7 plts 112,000.  Chemistries WNL with K+ 3.8 Cr 0.83 and normal LFTs.  Pt 12.6 PTT 30.  Will check Hep panel and HIV.  Have also recommended bone marrow biopsy for definitive diagnosis.  She will follow-up to go over results.    7.  Thrombocytopenia.  Plt count has been decreased going back to 2017.  Labs done 11/15/2017 showed Plt count of 109,000.  Labs done 03/16/2018 showed plts count of 112,000.  Coagulation studies WNL.  Pt is set up for bone marrow biopsy due to recurrent leukopenia.  Pt will follow-up to go over results.    8  Health maintenance.  Continue GI follow-up and mammogram screenings as recommended.    25 minutes spent with more than 50% spent in counseling and coordination of care.    Interval history: Historical data obtained from note dated 07/16/2017.  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  Pt was diagnosed 01/2014; treated with concurrent chemoradiation; completed treatment on 04/03/14.  She reports she was seen by  Dr. Benjamine Mola and had laryngoscopy performed that was negative.  She has noted evidence of a pulmonary nodule that was noted on PET scan done in June 2017.  Findings on PET are suspicious for an adenocarcinoma.  She reports she was being followed by Dr. Servando Snare.   Current status:   Patient is seen today for follow-up.  She reports no improvement in white areas on tongue.  She is here to go over labs.    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    01/19/2014 PET scan    Tongue mass with 2 level II Right neck nodes T2N2b, Stage IVA    02/08/2014 - 04/03/2014 Radiation Therapy    Initiation of radiation therapy with 70 Gy delivered to the right base of tongue/tonsil 63 Gy to high risk nodal echelons, 56 Gy to the intermediate r base of tongue/tonsil/bilateral neck nodes    02/08/2014 - 03/15/2014 Chemotherapy    weekly cisplatin, only 4 doses given, severe toxicity with neutropenia, dehydration, nausea, vomiting, hospitalization and obstipation    10/24/2015 PET scan    low level non malig range hyper metab in L apical sub solid pulm nodule, suspicious for low grade adeno. diffuse tongue hypermetab without CT correlation    10/28/2015 Procedure    Bronchoscopy navigation, electromagnetic navigation, bronchoscopy with biopsy of LUL lung lesion, placement of fiducial markers X 3    10/28/2015 Pathology Results    Scant benign lung tissue, no tumor seen      Problem List Patient Active Problem List   Diagnosis Date Noted  . Palpitations [R00.2] 05/18/2017  . Constipation [K59.00] 01/14/2017  . Esophageal dysphagia [R13.10] 01/13/2017  . Oral thrush [B37.0] 01/13/2017  . Nodule of left lung [R91.1] 10/17/2015  . Allergy to adhesive tape [Z91.048] 10/15/2015  . Diverticulitis large intestine [K57.32] 10/08/2015  . 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 Lung nodule, multiple [R91.8] 01/08/2015  . N&V (nausea and vomiting) [R11.2] 06/12/2014  . Hypotension, postural [I95.1] 06/12/2014  . Malignant neoplasm of tongue ([place]) [C02.9] 01/24/2014    Past Medical History Past Medical History:  Diagnosis Date  . Arthritis    lower back  . Constipation   . Diverticulitis   . Ectopic pregnancy 1977  . Esophageal cancer ([place]) 2015   tongue cancer, throat cancer  . GERD (gastroesophageal reflux disease)   . Headache    migraines  . Hypertension    no longer taking meds   . Hypokalemia 2015  . Hypothyroidism   . Lung nodule, multiple 01/08/2015  . Macular degeneration of right eye   . Malignant neoplasm of tongue ([place]) 01/24/2014  . Thyroid disease     Past Surgical History Past Surgical History:  Procedure Laterality Date  . BIOPSY PHARYNX  2015  . BREAST BIOPSY Bilateral    benign  . CHOLECYSTECTOMY  2005  . ESOPHAGOGASTRODUODENOSCOPY  06/19/2014   Dr. Britta Mccreedy: LA Class B esophagitis. PEG tube in place. erythema in proximal esophagus.   . ESOPHAGOGASTRODUODENOSCOPY (EGD) WITH PROPOFOL N/A 03/11/2017   Web in upper third of esophagus s/p dilation with scope passage, small hiatal hernia, normal duodenum, no specimens collected  . FUDUCIAL PLACEMENT N/A 10/28/2015   Procedure: PLACEMENT OF FUDUCIAL TIMES THREE; LEFT UPPER LOBE BIOPSY;  Surgeon: Grace Isaac, MD;  Location: [facility];  Service: Thoracic;  Laterality: N/A;  . MASTECTOMY, PARTIAL Left 1984   no cancer, fibrocystic breast disease  .  SKIN SURGERY  1960   moles removed from various areas per patient report  . TONSILLECTOMY  1950   per patient report  . TUBAL LIGATION  1976  . VAGINAL HYSTERECTOMY  1981   vaginal hyst per patient report  . VIDEO BRONCHOSCOPY WITH ENDOBRONCHIAL NAVIGATION N/A 10/28/2015   Procedure: VIDEO BRONCHOSCOPY WITH ENDOBRONCHIAL NAVIGATION;  Surgeon: Grace Isaac, MD;  Location: [facility];  Service: Thoracic;  Laterality: N/A;    Family History History reviewed. No pertinent family history.   Social History  reports that she quit smoking about 35 years ago. Her smoking use included cigarettes. She has a 23.00 pack-year smoking history. She has never used smokeless tobacco. She reports that she does not drink alcohol or use drugs.  Medications  Current Outpatient Medications:  .  acetaminophen-codeine (TYLENOL #3) 300-30 MG tablet, Take 1 tablet by mouth every 6 (six) hours as needed for moderate pain., Disp: , Rfl:  .  dexlansoprazole (DEXILANT) 60 MG capsule,  Take 60 mg by mouth once a week., Disp: , Rfl:  .  docusate sodium (COLACE) 100 MG capsule, Take 100 mg by mouth at bedtime. , Disp: , Rfl:  .  levothyroxine (SYNTHROID) 88 MCG tablet, Take 1 tablet (88 mcg total) by mouth daily before breakfast., Disp: 90 tablet, Rfl: 2 .  metoprolol tartrate (LOPRESSOR) 25 MG tablet, Take 0.5 tablets (12.5 mg total) by mouth 2 (two) times daily., Disp: 90 tablet, Rfl: 3 .  ondansetron (ZOFRAN-ODT) 8 MG disintegrating tablet, Take 8 mg by mouth daily. At 0700, 1500, and 2300, Disp: , Rfl:   Allergies Adhesive [tape] and Latex  Review of Systems Review of Systems - Oncology ROS white areas on tongue   Physical Exam  Vitals Wt Readings from Last 3 Encounters:  03/23/18 170 lb (77.1 kg)  12/03/17 170 lb 12.8 oz (77.5 kg)  11/15/17 169 lb 14.4 oz (77.1 kg)   Temp Readings from Last 3 Encounters:  03/23/18 98.2 F (36.8 C) (Oral)  02/15/18 98.1 F (36.7 C) (Oral)  11/15/17 97.7 F (36.5 C) (Oral)   BP Readings from Last 3 Encounters:  03/23/18 129/67  02/15/18 (!) 143/73  12/03/17 130/78   Pulse Readings from Last 3 Encounters:  03/23/18 94  02/15/18 70  12/03/17 64   Constitutional: Well-developed, well-nourished, and in no distress.   HENT: Head: Normocephalic and atraumatic.  Mouth/Throat: white areas on tongue.  No thrush.  Likely due to RT and tongue cancer history.   Eyes: Pupils are equal, round, and reactive to light. Conjunctivae are normal. No scleral icterus.  Neck: Normal range of motion. Neck supple. No JVD present.  Cardiovascular: Normal rate, regular rhythm and normal heart sounds.  Exam reveals no gallop and no friction rub.   No murmur heard. Pulmonary/Chest: Effort normal and breath sounds normal. No respiratory distress. No wheezes.No rales.  Abdominal: Soft. Bowel sounds are normal. No distension. There is no tenderness. There is no guarding.  Musculoskeletal: No edema or tenderness.  Lymphadenopathy:    No cervical  or supraclavicular adenopathy.  Neurological: Alert and oriented to person, place, and time. No cranial nerve deficit.  Skin: Skin is warm and dry. No rash noted. No erythema. No pallor.  Psychiatric: Affect and judgment normal.   Labs No visits with results within 3 Day(s) from this visit.  Latest known visit with results is:  Appointment on 03/16/2018  Component Date Value Ref Range Status  . WBC 03/16/2018 1.8* 4.0 - 10.5 K/uL Final  .  RBC 03/16/2018 4.95  3.87 - 5.11 MIL/uL Final  . Hemoglobin 03/16/2018 14.7  12.0 - 15.0 g/dL Final  . HCT 03/16/2018 43.7  36.0 - 46.0 % Final  . MCV 03/16/2018 88.3  80.0 - 100.0 fL Final  . MCH 03/16/2018 29.7  26.0 - 34.0 pg Final  . MCHC 03/16/2018 33.6  30.0 - 36.0 g/dL Final  . RDW 03/16/2018 12.6  11.5 - 15.5 % Final  . Platelets 03/16/2018 112* 150 - 400 K/uL Final   Comment: PLATELET COUNT CONFIRMED BY SMEAR SPECIMEN CHECKED FOR CLOTS Immature Platelet Fraction may be clinically indicated, consider ordering this additional test [id number]   . nRBC 03/16/2018 0.0  0.0 - 0.2 % Final  . Neutrophils Relative % 03/16/2018 58  % Final  . Neutro Abs 03/16/2018 1.1* 1.7 - 7.7 K/uL Final  . Lymphocytes Relative 03/16/2018 25  % Final  . Lymphs Abs 03/16/2018 0.5* 0.7 - 4.0 K/uL Final  . Monocytes Relative 03/16/2018 13  % Final  . Monocytes Absolute 03/16/2018 0.2  0.1 - 1.0 K/uL Final  . Eosinophils Relative 03/16/2018 3  % Final  . Eosinophils Absolute 03/16/2018 0.1  0.0 - 0.5 K/uL Final  . Basophils Relative 03/16/2018 1  % Final  . Basophils Absolute 03/16/2018 0.0  0.0 - 0.1 K/uL Final  . Immature Granulocytes 03/16/2018 0  % Final  . Abs Immature Granulocytes 03/16/2018 0.00  0.00 - 0.07 K/uL Final   Performed at [facility], [address] [place], [place] [address]  . Sodium 03/16/2018 138  135 - 145 mmol/L Final  . Potassium 03/16/2018 3.8  3.5 - 5.1 mmol/L Final  . Chloride 03/16/2018 106  98 - 111 mmol/L Final  . CO2 03/16/2018  26  22 - 32 mmol/L Final  . Glucose, Bld 03/16/2018 98  70 - 99 mg/dL Final  . BUN 03/16/2018 20  8 - 23 mg/dL Final  . Creatinine, Ser 03/16/2018 0.83  0.44 - 1.00 mg/dL Final  . Calcium 03/16/2018 9.4  8.9 - 10.3 mg/dL Final  . Total Protein 03/16/2018 7.1  6.5 - 8.1 g/dL Final  . Albumin 03/16/2018 4.2  3.5 - 5.0 g/dL Final  . AST 03/16/2018 22  15 - 41 U/L Final  . ALT 03/16/2018 13  0 - 44 U/L Final  . Alkaline Phosphatase 03/16/2018 51  38 - 126 U/L Final  . Total Bilirubin 03/16/2018 1.1  0.3 - 1.2 mg/dL Final  . GFR calc non Af Amer 03/16/2018 >60  >60 mL/min Final  . GFR calc Af Amer 03/16/2018 >60  >60 mL/min Final   Comment: (NOTE) The eGFR has been calculated using the CKD EPI equation. This calculation has not been validated in all clinical situations. eGFR's persistently <60 mL/min signify possible Chronic Kidney Disease.   Georgiann Hahn gap 03/16/2018 6  5 - 15 Final   Performed at [facility], [address], [place], [place] [address]  . LDH 03/16/2018 142  98 - 192 U/L Final   Performed at [facility], [address], [place], [place] [address]  . Prothrombin Time 03/16/2018 12.6  11.4 - 15.2 seconds Final  . INR 03/16/2018 0.95   Final   Performed at [facility], [address], [place], [place] [address]  . aPTT 03/16/2018 30  24 - 36 seconds Final   Performed at [facility], [address] [place],  [address]     Pathology Orders Placed This Encounter  Procedures  . CT BONE  MARROW BIOPSY & ASPIRATION    Standing Status:   Future    Standing Expiration Date:   06/24/2019    Order Specific Question:   Reason for Exam (SYMPTOM  OR DIAGNOSIS REQUIRED)    Answer:   leukopenia and thrombocytopenia    Order Specific Question:   Preferred imaging location?    Answer:   [facility]    Order Specific Question:   Radiology Contrast Protocol - do NOT remove file path    Answer:   \\[email or web address].pdf  . CT BIOPSY    Standing  Status:   Future    Standing Expiration Date:   03/23/2019    Order Specific Question:   Lab orders requested (DO NOT place separate lab orders, these will be automatically ordered during procedure specimen collection):    Answer:   Surgical Pathology    Comments:   Fish for MDS    Order Specific Question:   Reason for Exam (SYMPTOM  OR DIAGNOSIS REQUIRED)    Answer:   leukopenia and thrombocytopenia    Order Specific Question:   Preferred imaging location?    Answer:   [facility]    Order Specific Question:   Radiology Contrast Protocol - do NOT remove file path    Answer:   \\[email or web address].pdf  . Hepatitis B surface antibody    Standing Status:   Future    Number of Occurrences:   1    Standing Expiration Date:   03/24/2019  . Hepatitis B surface antigen    Standing Status:   Future    Number of Occurrences:   1    Standing Expiration Date:   03/24/2019  . Hepatitis B core antibody, total    Standing Status:   Future    Number of Occurrences:   1    Standing Expiration Date:   03/24/2019  . Hepatitis panel, acute    Standing Status:   Future    Number of Occurrences:   1    Standing Expiration Date:   03/24/2019  . Hepatitis C RNA quantitative    Standing Status:   Future    Number of Occurrences:   1    Standing Expiration Date:   03/24/2019  . HIV antibody (with reflex)    Standing Status:   Future    Number of Occurrences:   1    Standing Expiration Date:   03/24/2019       Zoila Shutter MD

## 2018-03-24 ENCOUNTER — Ambulatory Visit: Payer: Medicare Other | Admitting: Gastroenterology

## 2018-03-24 LAB — HEPATITIS PANEL, ACUTE
HCV Ab: <0.1 s/co ratio (ref 0.0–0.9)
Hep A IgM: NEGATIVE
Hep B C IgM: NEGATIVE
Hepatitis B Surface Ag: NEGATIVE

## 2018-03-24 LAB — HIV ANTIBODY (ROUTINE TESTING W REFLEX): HIV Screen 4th Generation wRfx: NONREACTIVE

## 2018-03-24 LAB — HEPATITIS B SURFACE ANTIBODY,QUALITATIVE: Hep B S Ab: NONREACTIVE

## 2018-03-24 LAB — HEPATITIS B CORE ANTIBODY, TOTAL: Hep B Core Total Ab: NEGATIVE

## 2018-03-24 LAB — HEPATITIS B SURFACE ANTIGEN: Hepatitis B Surface Ag: NEGATIVE

## 2018-03-28 ENCOUNTER — Telehealth (HOSPITAL_COMMUNITY): Payer: Self-pay | Admitting: Internal Medicine

## 2018-03-28 NOTE — Telephone Encounter (Signed)
Asked to call pt regarding bone marrow biopsy scheduled for Wednesday 03/30/2018.    Spoke with pt and she reports she was on my chart and saw a CT biopsy of lung scheduled.  I reassured her she is scheduled for bone marrow biopsy and not lung biopsy.  Have spoken with schedulers also regarding procedure scheduled who will also contact pt for reassurance.

## 2018-03-29 ENCOUNTER — Other Ambulatory Visit: Payer: Self-pay | Admitting: Radiology

## 2018-03-30 ENCOUNTER — Other Ambulatory Visit: Payer: Self-pay

## 2018-03-30 ENCOUNTER — Encounter (HOSPITAL_COMMUNITY): Payer: Self-pay

## 2018-03-30 ENCOUNTER — Ambulatory Visit (HOSPITAL_COMMUNITY)
Admission: RE | Admit: 2018-03-30 | Discharge: 2018-03-30 | Disposition: A | Discharge status: Home / Self Care | Payer: Medicare Other | Source: Ambulatory Visit | Attending: Internal Medicine | Admitting: Internal Medicine

## 2018-03-30 DIAGNOSIS — E039 Hypothyroidism, unspecified: Secondary | ICD-10-CM | POA: Diagnosis not present

## 2018-03-30 DIAGNOSIS — Z91040 Latex allergy status: Secondary | ICD-10-CM | POA: Diagnosis not present

## 2018-03-30 DIAGNOSIS — D696 Thrombocytopenia, unspecified: Secondary | ICD-10-CM

## 2018-03-30 DIAGNOSIS — C029 Malignant neoplasm of tongue, unspecified: Secondary | ICD-10-CM

## 2018-03-30 DIAGNOSIS — H3530 Unspecified macular degeneration: Secondary | ICD-10-CM | POA: Insufficient documentation

## 2018-03-30 DIAGNOSIS — K219 Gastro-esophageal reflux disease without esophagitis: Secondary | ICD-10-CM | POA: Insufficient documentation

## 2018-03-30 DIAGNOSIS — Z79899 Other long term (current) drug therapy: Secondary | ICD-10-CM | POA: Insufficient documentation

## 2018-03-30 DIAGNOSIS — D72818 Other decreased white blood cell count: Secondary | ICD-10-CM | POA: Insufficient documentation

## 2018-03-30 DIAGNOSIS — Z85810 Personal history of malignant neoplasm of tongue: Secondary | ICD-10-CM | POA: Diagnosis not present

## 2018-03-30 DIAGNOSIS — Z79890 Hormone replacement therapy (postmenopausal): Secondary | ICD-10-CM | POA: Diagnosis not present

## 2018-03-30 LAB — CBC WITH DIFFERENTIAL/PLATELET
Abs Immature Granulocytes: 0.01 10*3/uL (ref 0.00–0.07)
Basophils Absolute: 0 10*3/uL (ref 0.0–0.1)
Basophils Relative: 0 %
Eosinophils Absolute: 0.1 10*3/uL (ref 0.0–0.5)
Eosinophils Relative: 2 %
HCT: 43.6 % (ref 36.0–46.0)
Hemoglobin: 14.8 g/dL (ref 12.0–15.0)
Immature Granulocytes: 0 %
Lymphocytes Relative: 22 %
Lymphs Abs: 0.5 10*3/uL — ABNORMAL LOW (ref 0.7–4.0)
MCH: 30.3 pg (ref 26.0–34.0)
MCHC: 33.9 g/dL (ref 30.0–36.0)
MCV: 89.3 fL (ref 80.0–100.0)
Monocytes Absolute: 0.3 10*3/uL (ref 0.1–1.0)
Monocytes Relative: 12 %
Neutro Abs: 1.5 10*3/uL — ABNORMAL LOW (ref 1.7–7.7)
Neutrophils Relative %: 64 %
Platelets: 111 10*3/uL — ABNORMAL LOW (ref 150–400)
RBC: 4.88 MIL/uL (ref 3.87–5.11)
RDW: 12.5 % (ref 11.5–15.5)
WBC: 2.4 10*3/uL — ABNORMAL LOW (ref 4.0–10.5)
nRBC: 0 % (ref 0.0–0.2)

## 2018-03-30 MED ORDER — FENTANYL CITRATE (PF) 100 MCG/2ML IJ SOLN
INTRAMUSCULAR | Status: AC | PRN
  Administered 2018-03-30 (×2): 50 ug via INTRAVENOUS

## 2018-03-30 MED ORDER — LIDOCAINE HCL (PF) 1 % IJ SOLN
INTRAMUSCULAR | Status: AC | PRN
  Administered 2018-03-30: 10 mL

## 2018-03-30 MED ORDER — MIDAZOLAM HCL 2 MG/2ML IJ SOLN
INTRAMUSCULAR | Status: AC
  Filled 2018-03-30: qty 2

## 2018-03-30 MED ORDER — MIDAZOLAM HCL 2 MG/2ML IJ SOLN
INTRAMUSCULAR | Status: AC | PRN
  Administered 2018-03-30 (×2): 1 mg via INTRAVENOUS

## 2018-03-30 MED ORDER — SODIUM CHLORIDE 0.9 % IV SOLN
INTRAVENOUS | Status: DC
  Administered 2018-03-30: 09:00:00 via INTRAVENOUS

## 2018-03-30 MED ORDER — FENTANYL CITRATE (PF) 100 MCG/2ML IJ SOLN
INTRAMUSCULAR | Status: AC
  Filled 2018-03-30: qty 2

## 2018-03-30 NOTE — Procedures (Signed)
Interventional Radiology Procedure Note  Procedure: CT guided bone marrow aspiration and biopsy  Complications: None  EBL: < 10 mL  Findings: Aspirate and core biopsy performed of bone marrow in right iliac bone.  Plan: Bedrest supine x 1 hrs  Whittley Carandang T. Munira Polson, M.D Pager:  [phone]   

## 2018-03-30 NOTE — H&P (Signed)
Chief Complaint: Patient was seen in consultation today for bone marrow biopsy at the request of Higgs,Vetta  Referring Physician(s): Higgs,Vetta  Supervising Physician: Aletta Edouard  Patient Status: [facility] - Out-pt  History of Present Illness: Candace Lewis is a 76 y.o. female with prior hx of tongue cancer. She is also being worked up for leukopenia and thrombocytopenia. She is referred for bone marrow biopsy. PMHx, meds, labs, imaging, allergies reviewed. Feels well, no recent fevers, chills, illness. Has been NPO today as directed.   Past Medical History:  Diagnosis Date  . Arthritis    lower back  . Constipation   . Diverticulitis   . Ectopic pregnancy 1977  . Esophageal cancer ([place]) 2015   tongue cancer, throat cancer  . GERD (gastroesophageal reflux disease)   . Headache    migraines  . Hypertension    no longer taking meds  . Hypokalemia 2015  . Hypothyroidism   . Lung nodule, multiple 01/08/2015  . Macular degeneration of right eye   . Malignant neoplasm of tongue ([place]) 01/24/2014  . Thyroid disease     Past Surgical History:  Procedure Laterality Date  . BIOPSY PHARYNX  2015  . BREAST BIOPSY Bilateral    benign  . CHOLECYSTECTOMY  2005  . ESOPHAGOGASTRODUODENOSCOPY  06/19/2014   Dr. Britta Mccreedy: LA Class B esophagitis. PEG tube in place. erythema in proximal esophagus.   . ESOPHAGOGASTRODUODENOSCOPY (EGD) WITH PROPOFOL N/A 03/11/2017   Web in upper third of esophagus s/p dilation with scope passage, small hiatal hernia, normal duodenum, no specimens collected  . FUDUCIAL PLACEMENT N/A 10/28/2015   Procedure: PLACEMENT OF FUDUCIAL TIMES THREE; LEFT UPPER LOBE BIOPSY;  Surgeon: Grace Isaac, MD;  Location: [facility];  Service: Thoracic;  Laterality: N/A;  . MASTECTOMY, PARTIAL Left 1984   no cancer, fibrocystic breast disease  . SKIN SURGERY  1960   moles removed from various areas per patient report  . TONSILLECTOMY  1950   per patient report  .  TUBAL LIGATION  1976  . VAGINAL HYSTERECTOMY  1981   vaginal hyst per patient report  . VIDEO BRONCHOSCOPY WITH ENDOBRONCHIAL NAVIGATION N/A 10/28/2015   Procedure: VIDEO BRONCHOSCOPY WITH ENDOBRONCHIAL NAVIGATION;  Surgeon: Grace Isaac, MD;  Location: [facility];  Service: Thoracic;  Laterality: N/A;    Allergies: Adhesive [tape] and Latex  Medications: Prior to Admission medications   Medication Sig Start Date End Date Taking? Authorizing Provider  acetaminophen-codeine (TYLENOL #3) 300-30 MG tablet Take 1 tablet by mouth every 6 (six) hours as needed for moderate pain.   Yes [provider]  dexlansoprazole (DEXILANT) 60 MG capsule Take 60 mg by mouth once a week.   Yes [provider]  docusate sodium (COLACE) 100 MG capsule Take 100 mg by mouth at bedtime.    Yes [provider]  levothyroxine (SYNTHROID) 88 MCG tablet Take 1 tablet (88 mcg total) by mouth daily before breakfast. 07/16/17  Yes Holley Bouche, NP  metoprolol tartrate (LOPRESSOR) 25 MG tablet Take 0.5 tablets (12.5 mg total) by mouth 2 (two) times daily. 08/26/17 03/30/18 Yes Herminio Commons, MD  ondansetron (ZOFRAN-ODT) 8 MG disintegrating tablet Take 8 mg by mouth daily. At 0700, 1500, and 2300 02/27/16  Yes [provider]     History reviewed. No pertinent family history.  Social History   Socioeconomic History  . Marital status: Married    Spouse name: Not on file  . Number of children: Not on file  .  Years of education: Not on file  . Highest education level: Not on file  Occupational History  . Not on file  Social Needs  . Financial resource strain: Not on file  . Food insecurity:    Worry: Not on file    Inability: Not on file  . Transportation needs:    Medical: Not on file    Non-medical: Not on file  Tobacco Use  . Smoking status: Former Smoker    Packs/day: 1.00    Years: 23.00    Pack years: 23.00    Types: Cigarettes    Last attempt to quit:  05/03/1982    Years since quitting: 35.9  . Smokeless tobacco: Never Used  Substance and Sexual Activity  . Alcohol use: No    Alcohol/week: 0.0 standard drinks  . Drug use: No  . Sexual activity: Never    Birth control/protection: None  Lifestyle  . Physical activity:    Days per week: Not on file    Minutes per session: Not on file  . Stress: Not on file  Relationships  . Social connections:    Talks on phone: Not on file    Gets together: Not on file    Attends religious service: Not on file    Active member of club or organization: Not on file    Attends meetings of clubs or organizations: Not on file    Relationship status: Not on file  Other Topics Concern  . Not on file  Social History Narrative  . Not on file    Review of Systems: A 12 point ROS discussed and pertinent positives are indicated in the HPI above.  All other systems are negative.  Review of Systems  Vital Signs: BP (!) 146/82   Pulse 88   Temp 98.2 F (36.8 C) (Oral)   Resp 16   SpO2 99%   Physical Exam  Constitutional: She is oriented to person, place, and time. She appears well-developed and well-nourished. No distress.  HENT:  Head: Normocephalic.  Mouth/Throat: Oropharynx is clear and moist.  Neck: Normal range of motion. No JVD present. No tracheal deviation present.  Cardiovascular: Normal rate, regular rhythm and normal heart sounds.  Pulmonary/Chest: Effort normal and breath sounds normal. No respiratory distress.  Neurological: She is alert and oriented to person, place, and time.  Skin: Skin is warm and dry.  Psychiatric: She has a normal mood and affect.     Imaging: No results found.  Labs:  CBC: Recent Labs    05/19/17 0808 07/16/17 1350 11/15/17 1301 03/16/18 1200  WBC 2.9* 2.6* 2.8* 1.8*  HGB 13.7 12.8 13.9 14.7  HCT 41.3 39.4 40.9 43.7  PLT 144* 127* 109* 112*    COAGS: Recent Labs    03/16/18 1200  INR 0.95  APTT 30    BMP: Recent Labs     05/19/17 0808 07/16/17 1350 11/15/17 1301 03/16/18 1200  NA 138 137 140 138  K 3.7 3.7 3.8 3.8  CL 101 102 106 106  CO2 27 27 29 26   GLUCOSE 117* 100* 104* 98  BUN 25* 25* 25* 20  CALCIUM 9.4 9.2 9.2 9.4  CREATININE 0.93 0.87 0.96 0.83  GFRNONAA 59* >60 56* >60  GFRAA >60 >60 >60 >60    LIVER FUNCTION TESTS: Recent Labs    05/19/17 0808 07/16/17 1350 11/15/17 1301 03/16/18 1200  BILITOT 0.6 0.7 0.8 1.1  AST 21 19 19 22   ALT 18 15 13 13   ALKPHOS  73 64 45 51  PROT 7.5 6.8 6.7 7.1  ALBUMIN 4.0 3.8 3.8 4.2    TUMOR MARKERS: No results for input(s): AFPTM, CEA, CA199, CHROMGRNA in the last 8760 hours.  Assessment and Plan: Hx tongue cancer Leukopenia and Thrombocytopenia For CT guided bone marrow biopsy. Labs ok Risks and benefits discussed with the patient including, but not limited to bleeding, infection, damage to adjacent structures or low yield requiring additional tests.  All of the patient's questions were answered, patient is agreeable to proceed. Consent signed and in chart.    Thank you for this interesting consult.  I greatly enjoyed meeting Candace Lewis and look forward to participating in their care.  A copy of this report was sent to the requesting provider on this date.  Electronically Signed: Ascencion Dike, PA-C 03/30/2018, 9:52 AM   I spent a total of 20 minutes in face to face in clinical consultation, greater than 50% of which was counseling/coordinating care for bone marrow bx

## 2018-03-30 NOTE — Discharge Instructions (Signed)
Bone Marrow Aspiration and Bone Marrow Biopsy, Adult, Care After °This sheet gives you information about how to care for yourself after your procedure. Your health care provider may also give you more specific instructions. If you have problems or questions, contact your health care provider. °What can I expect after the procedure? °After the procedure, it is common to have: °· Mild pain and tenderness. °· Swelling. °· Bruising. ° °Follow these instructions at home: °· Take over-the-counter or prescription medicines only as told by your health care provider. °· Do not take baths, swim, or use a hot tub until your health care provider approves. Ask if you can take a shower or have a sponge bath. °· Follow instructions from your health care provider about how to take care of the puncture site. Make sure you: °? Wash your hands with soap and water before you change your bandage (dressing). If soap and water are not available, use hand sanitizer. °? Change your dressing as told by your health care provider. °· Check your puncture site every day for signs of infection. Check for: °? More redness, swelling, or pain. °? More fluid or blood. °? Warmth. °? Pus or a bad smell. °· Return to your normal activities as told by your health care provider. Ask your health care provider what activities are safe for you. °· Do not drive for 24 hours if you were given a medicine to help you relax (sedative). °· Keep all follow-up visits as told by your health care provider. This is important. °Contact a health care provider if: °· You have more redness, swelling, or pain around the puncture site. °· You have more fluid or blood coming from the puncture site. °· Your puncture site feels warm to the touch. °· You have pus or a bad smell coming from the puncture site. °· You have a fever. °· Your pain is not controlled with medicine. °This information is not intended to replace advice given to you by your health care provider. Make sure  you discuss any questions you have with your health care provider. °Document Released: 11/07/2004 Document Revised: 11/08/2015 Document Reviewed: 10/02/2015 °Elsevier Interactive Patient Education © 2018 Elsevier Inc[employer] °Moderate Conscious Sedation, Adult, Care After °These instructions provide you with information about caring for yourself after your procedure. Your health care provider may also give you more specific instructions. Your treatment has been planned according to current medical practices, but problems sometimes occur. Call your health care provider if you have any problems or questions after your procedure. °What can I expect after the procedure? °After your procedure, it is common: °· To feel sleepy for several hours. °· To feel clumsy and have poor balance for several hours. °· To have poor judgment for several hours. °· To vomit if you eat too soon. ° °Follow these instructions at home: °For at least 24 hours after the procedure: ° °· Do not: °? Participate in activities where you could fall or become injured. °? Drive. °? Use heavy machinery. °? Drink alcohol. °? Take sleeping pills or medicines that cause drowsiness. °? Make important decisions or sign legal documents. °? Take care of children on your own. °· Rest. °Eating and drinking °· Follow the diet recommended by your health care provider. °· If you vomit: °? Drink water, juice, or soup when you can drink without vomiting. °? Make sure you have little or no nausea before eating solid foods. °General instructions °· Have a responsible adult stay with you until you are   awake and alert.  Take over-the-counter and prescription medicines only as told by your health care provider.  If you smoke, do not smoke without supervision.  Keep all follow-up visits as told by your health care provider. This is important. Contact a health care provider if:  You keep feeling nauseous or you keep vomiting.  You feel light-headed.  You develop a  rash.  You have a fever. Get help right away if:  You have trouble breathing. This information is not intended to replace advice given to you by your health care provider. Make sure you discuss any questions you have with your health care provider. Document Released: 02/08/2013 Document Revised: 09/23/2015 Document Reviewed: 08/10/2015 Elsevier Interactive Patient Education  [employer].

## 2018-04-15 ENCOUNTER — Encounter (HOSPITAL_COMMUNITY): Payer: Self-pay | Admitting: Internal Medicine

## 2018-04-15 ENCOUNTER — Other Ambulatory Visit (HOSPITAL_COMMUNITY): Payer: Self-pay | Admitting: *Deleted

## 2018-04-15 DIAGNOSIS — D696 Thrombocytopenia, unspecified: Secondary | ICD-10-CM

## 2018-04-18 ENCOUNTER — Encounter (HOSPITAL_COMMUNITY): Payer: Self-pay | Admitting: Internal Medicine

## 2018-04-18 ENCOUNTER — Inpatient Hospital Stay (HOSPITAL_COMMUNITY): Payer: Medicare Other | Attending: Internal Medicine | Admitting: Internal Medicine

## 2018-04-18 ENCOUNTER — Inpatient Hospital Stay (HOSPITAL_COMMUNITY): Payer: Medicare Other | Attending: Hematology

## 2018-04-18 ENCOUNTER — Other Ambulatory Visit: Payer: Self-pay

## 2018-04-18 VITALS — BP 139/88 | HR 63 | Temp 98.2°F | Resp 16 | Wt 171.6 lb

## 2018-04-18 DIAGNOSIS — R918 Other nonspecific abnormal finding of lung field: Secondary | ICD-10-CM | POA: Diagnosis not present

## 2018-04-18 DIAGNOSIS — E039 Hypothyroidism, unspecified: Secondary | ICD-10-CM | POA: Insufficient documentation

## 2018-04-18 DIAGNOSIS — D696 Thrombocytopenia, unspecified: Secondary | ICD-10-CM | POA: Insufficient documentation

## 2018-04-18 DIAGNOSIS — Z923 Personal history of irradiation: Secondary | ICD-10-CM | POA: Insufficient documentation

## 2018-04-18 DIAGNOSIS — D72819 Decreased white blood cell count, unspecified: Secondary | ICD-10-CM | POA: Insufficient documentation

## 2018-04-18 DIAGNOSIS — C029 Malignant neoplasm of tongue, unspecified: Secondary | ICD-10-CM | POA: Insufficient documentation

## 2018-04-18 DIAGNOSIS — I10 Essential (primary) hypertension: Secondary | ICD-10-CM | POA: Insufficient documentation

## 2018-04-18 DIAGNOSIS — R1310 Dysphagia, unspecified: Secondary | ICD-10-CM | POA: Insufficient documentation

## 2018-04-18 DIAGNOSIS — Z87891 Personal history of nicotine dependence: Secondary | ICD-10-CM

## 2018-04-18 LAB — CBC WITH DIFFERENTIAL/PLATELET
Abs Immature Granulocytes: 0.01 10*3/uL (ref 0.00–0.07)
Basophils Absolute: 0 10*3/uL (ref 0.0–0.1)
Basophils Relative: 0 %
Eosinophils Absolute: 0.1 10*3/uL (ref 0.0–0.5)
Eosinophils Relative: 3 %
HCT: 39.7 % (ref 36.0–46.0)
Hemoglobin: 13.5 g/dL (ref 12.0–15.0)
Immature Granulocytes: 0 %
Lymphocytes Relative: 20 %
Lymphs Abs: 0.5 10*3/uL — ABNORMAL LOW (ref 0.7–4.0)
MCH: 30.1 pg (ref 26.0–34.0)
MCHC: 34 g/dL (ref 30.0–36.0)
MCV: 88.4 fL (ref 80.0–100.0)
Monocytes Absolute: 0.3 10*3/uL (ref 0.1–1.0)
Monocytes Relative: 13 %
Neutro Abs: 1.7 10*3/uL (ref 1.7–7.7)
Neutrophils Relative %: 64 %
Platelets: 120 10*3/uL — ABNORMAL LOW (ref 150–400)
RBC: 4.49 MIL/uL (ref 3.87–5.11)
RDW: 12.6 % (ref 11.5–15.5)
WBC: 2.7 10*3/uL — ABNORMAL LOW (ref 4.0–10.5)
nRBC: 0 % (ref 0.0–0.2)

## 2018-04-18 LAB — COMPREHENSIVE METABOLIC PANEL
ALT: 14 U/L (ref 0–44)
AST: 21 U/L (ref 15–41)
Albumin: 3.9 g/dL (ref 3.5–5.0)
Alkaline Phosphatase: 46 U/L (ref 38–126)
Anion gap: 7 (ref 5–15)
BUN: 25 mg/dL — ABNORMAL HIGH (ref 8–23)
CO2: 26 mmol/L (ref 22–32)
Calcium: 9.2 mg/dL (ref 8.9–10.3)
Chloride: 105 mmol/L (ref 98–111)
Creatinine, Ser: 0.82 mg/dL (ref 0.44–1.00)
GFR calc Af Amer: >60 mL/min (ref >60)
GFR calc non Af Amer: >60 mL/min (ref >60)
Glucose, Bld: 108 mg/dL — ABNORMAL HIGH (ref 70–99)
Potassium: 3.9 mmol/L (ref 3.5–5.1)
Sodium: 138 mmol/L (ref 135–145)
Total Bilirubin: 0.7 mg/dL (ref 0.3–1.2)
Total Protein: 6.5 g/dL (ref 6.5–8.1)

## 2018-04-18 LAB — LACTATE DEHYDROGENASE: LDH: 140 U/L (ref 98–192)

## 2018-04-18 NOTE — Patient Instructions (Signed)
[facility]  Discharge Instructions: You saw Dr. Higgs today                               _______________________________________________________________  Thank you for choosing [facility] at [facility] to provide your oncology and hematology care.  To afford each patient quality time with our providers, please arrive at least 15 minutes before your scheduled appointment.  You need to re-schedule your appointment if you arrive 10 or more minutes late.  We strive to give you quality time with our providers, and arriving late affects you and other patients whose appointments are after yours.  Also, if you no show three or more times for appointments you may be dismissed from the clinic.  Again, thank you for choosing [facility]. Our hope is that these requests will allow you access to exceptional care and in a timely manner. _______________________________________________________________  If you have questions after your visit, please contact our office at [phone] between the hours of 8:30 a.m. and 5:00 p.m. Voicemails left after 4:30 p.m. will not be returned until the following business day. _______________________________________________________________  For prescription refill requests, have your pharmacy contact our office. _______________________________________________________________  Recommendations made by the consultant and any test results will be sent to your referring physician. _______________________________________________________________ 

## 2018-04-18 NOTE — Progress Notes (Signed)
Diagnosis Thrombocytopenia (HCC) - Plan: CBC with Differential/Platelet, Comprehensive metabolic panel, Lactate dehydrogenase  Abnormal findings on diagnostic imaging of lung - Plan: NM PET Image Restag (PS) Skull Base To Thigh  Staging Cancer Staging No matching staging information was found for the patient.  Assessment and Plan:   1.  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  Pt had PET scan done 08/02/2017 that showed IMPRESSION: 1. No evidence local head neck cancer recurrence. 2. No evidence of metastatic cervical lymphadenopathy. 3. No evidence of distant metastatic disease. 4. Stable post radiation change in the LEFT upper lobe.  She should continue to follow-up with Dr. Servando Snare as directed.  Pt is set up for PET scan in 08/2018 and will follow-up at that time to go over results.   2.  Pulmonary nodule.  The patient is followed by  Dr. Servando Snare.  Pet scan that was done 08/02/2017 showed no evidence of metastatic cervical lymphadenopathy; No evidence of distant metastatic disease; Stable post radiation change in the LEFT upper lobe.  She should follow-up with Dr. Servando Snare as directed.  Pt is set up for PET scan in 08/2018 for ongoing monitoring of pulmonary nodule.    3  Dry mouth and white patch on tongue.  Previously discussed with her this is due to prior therapy.  She has some white patches on tongue.  She was prescribed Diflucan 150 mg po x 1 and advised to use Biotene mouth wash. She reports no improvement.  Pt should continue to follow-up with ENT.  I have again discussed with her findings are likely due to diagnosis and prior therapy.  .  4.  Trouble swallowing.  Follow-up with GI as directed.    5.  Hypothyroidism.  Follow-up with PCP for ongoing management.    6.  Leukopenia/thrombocytopenia.  Labs done 04/18/2018 reviewed and showed WBC 2.7 HB 13.5 plts 120,000.  Chemistries WNL with K+ 3.9 Cr 0.82 and normal LFTs.    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 Overall, the findings in this marrow are largely non-specific. However, a low grade myelodysplastic syndrome with minimal morphologic atypia cannot be entirely excluded. Correlation with cytogenetics/FISH is recommended    Pt has normal FISH and Cytogenetics.  I have discussed with her overall picture is [place] marrow.  She will remain on observation and will continue to have labs monitored for any significant changes.  She is advised to notify the Office if she has any problems prior to her next visit.    7.  Health maintenance.  Continue GI follow-up and mammogram screenings as recommended.    25 minutes spent with more than 50% spent in counseling and coordination of care.    Interval history: Historical data obtained from note dated 07/16/2017.  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  Pt was diagnosed 01/2014; treated with concurrent chemoradiation; completed treatment on 04/03/14.  She reports she was seen by Dr. Benjamine Mola and had laryngoscopy performed that was negative.  She has noted evidence of a pulmonary nodule that was noted on PET scan done in June 2017.  Findings on PET are suspicious for an adenocarcinoma.  She reports she was being followed by Dr. Servando Snare.   Current status:   Patient  is seen today for follow-up.  She is here to go over bone marrow results.      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    01/19/2014 PET scan    Tongue mass with 2 level II Right neck nodes T2N2b, Stage IVA    02/08/2014 - 04/03/2014 Radiation Therapy    Initiation of radiation therapy with 70 Gy  delivered to the right base of tongue/tonsil 63 Gy to high risk nodal echelons, 56 Gy to the intermediate r base of tongue/tonsil/bilateral neck nodes    02/08/2014 - 03/15/2014 Chemotherapy    weekly cisplatin, only 4 doses given, severe toxicity with neutropenia, dehydration, nausea, vomiting, hospitalization and obstipation    10/24/2015 PET scan    low level non malig range hyper metab in L apical sub solid pulm nodule, suspicious for low grade adeno. diffuse tongue hypermetab without CT correlation    10/28/2015 Procedure    Bronchoscopy navigation, electromagnetic navigation, bronchoscopy with biopsy of LUL lung lesion, placement of fiducial markers X 3    10/28/2015 Pathology Results    Scant benign lung tissue, no tumor seen      Problem List Patient Active Problem List   Diagnosis Date Noted  . Palpitations [R00.2] 05/18/2017  . Constipation [K59.00] 01/14/2017  . Esophageal dysphagia [R13.10] 01/13/2017  . Oral thrush [B37.0] 01/13/2017  . Nodule of left lung [R91.1] 10/17/2015  . Allergy to adhesive tape [Z91.048] 10/15/2015  . Diverticulitis large intestine [K57.32] 10/08/2015  . 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 Lung nodule, multiple [R91.8] 01/08/2015  . N&V (nausea and vomiting) [R11.2] 06/12/2014  . Hypotension, postural [I95.1] 06/12/2014  . Malignant neoplasm of tongue ([place]) [C02.9] 01/24/2014    Past Medical History Past Medical History:  Diagnosis Date  . Arthritis    lower back  . Constipation   . Diverticulitis   . Ectopic pregnancy 1977  . Esophageal cancer ([place]) 2015   tongue cancer, throat cancer  . GERD (gastroesophageal reflux disease)   . Headache    migraines  . Hypertension    no longer taking meds  . Hypokalemia 2015  . Hypothyroidism   . Lung nodule, multiple 01/08/2015  . Macular degeneration of right eye   . Malignant neoplasm of tongue ([place]) 01/24/2014  . Thyroid disease     Past Surgical History Past Surgical History:   Procedure Laterality Date  . BIOPSY PHARYNX  2015  . BREAST BIOPSY Bilateral    benign  . CHOLECYSTECTOMY  2005  . ESOPHAGOGASTRODUODENOSCOPY  06/19/2014   Dr. Britta Mccreedy: LA Class B esophagitis. PEG tube in place. erythema in proximal esophagus.   . ESOPHAGOGASTRODUODENOSCOPY (EGD) WITH PROPOFOL N/A 03/11/2017   Web in upper third of esophagus s/p dilation with scope passage, small hiatal hernia, normal duodenum, no specimens collected  . FUDUCIAL PLACEMENT N/A 10/28/2015   Procedure: PLACEMENT OF FUDUCIAL TIMES THREE; LEFT UPPER LOBE BIOPSY;  Surgeon: Grace Isaac, MD;  Location: [facility];  Service: Thoracic;  Laterality: N/A;  . MASTECTOMY, PARTIAL Left 1984   no cancer, fibrocystic breast disease  . SKIN SURGERY  1960   moles removed from various areas per patient report  . TONSILLECTOMY  1950   per patient report  . TUBAL LIGATION  1976  . VAGINAL HYSTERECTOMY  1981   vaginal hyst per patient report  . VIDEO BRONCHOSCOPY WITH ENDOBRONCHIAL NAVIGATION N/A 10/28/2015   Procedure: VIDEO BRONCHOSCOPY WITH ENDOBRONCHIAL NAVIGATION;  Surgeon: Grace Isaac, MD;  Location: [facility];  Service: Thoracic;  Laterality: N/A;    Family History History reviewed. No pertinent family history.   Social History  reports that she quit smoking about 35 years ago. Her smoking use included cigarettes. She has a 23.00 pack-year smoking history. She has never used smokeless tobacco. She reports that she does not drink alcohol or use drugs.  Medications  Current Outpatient Medications:  .  acetaminophen-codeine (TYLENOL #3) 300-30 MG tablet, Take 1 tablet by mouth every 6 (six) hours as needed for moderate pain., Disp: , Rfl:  .  dexlansoprazole (DEXILANT) 60 MG capsule, Take 60 mg by mouth once a week., Disp: , Rfl:  .  docusate sodium (COLACE) 100 MG capsule, Take 100 mg by mouth at bedtime. , Disp: , Rfl:  .  levothyroxine (SYNTHROID) 88 MCG tablet, Take 1 tablet (88 mcg total) by mouth daily before  breakfast., Disp: 90 tablet, Rfl: 2 .  metoprolol tartrate (LOPRESSOR) 25 MG tablet, Take 0.5 tablets (12.5 mg total) by mouth 2 (two) times daily., Disp: 90 tablet, Rfl: 3 .  ondansetron (ZOFRAN-ODT) 8 MG disintegrating tablet, Take 8 mg by mouth daily. Pt only takes once a day, Disp: , Rfl:   Allergies Adhesive [tape] and Latex  Review of Systems Review of Systems - Oncology ROS negative other than trouble swallowing.     Physical Exam  Vitals Wt Readings from Last 3 Encounters:  04/18/18 171 lb 9.6 oz (77.8 kg)  03/23/18 170 lb (77.1 kg)  12/03/17 170 lb 12.8 oz (77.5 kg)   Temp Readings from Last 3 Encounters:  04/18/18 98.2 F (36.8 C) (Oral)  03/30/18 97.7 F (36.5 C)  03/23/18 98.2 F (36.8 C) (Oral)   BP Readings from Last 3 Encounters:  04/18/18 139/88  03/30/18 125/61  03/23/18 129/67   Pulse Readings from Last 3 Encounters:  04/18/18 63  03/30/18 64  03/23/18 94   Constitutional: Well-developed, well-nourished, and in no distress.   HENT: Head: Normocephalic and atraumatic.  Mouth/Throat: No oropharyngeal exudate. Mucosa moist.White areas on tongue unchanged.   Eyes: Pupils are equal, round, and reactive to light. Conjunctivae are normal. No scleral icterus.  Neck: Normal range of motion. Neck supple. No JVD present.  Cardiovascular: Normal rate, regular rhythm and normal heart sounds.  Exam reveals no gallop and no friction rub.   No murmur heard. Pulmonary/Chest: Effort normal and breath sounds normal. No respiratory distress. No wheezes.No rales.  Abdominal: Soft. Bowel sounds are normal. No distension. There is no tenderness. There is no guarding.  Musculoskeletal: No edema or tenderness.  Lymphadenopathy: No cervical, axillary or supraclavicular adenopathy.  Neurological: Alert and oriented to person, place, and time. No cranial nerve deficit.  Skin: Skin is warm and dry. No rash noted. No erythema. No pallor.  Psychiatric: Affect and judgment  normal.   Labs Appointment on 04/18/2018  Component Date Value Ref Range Status  . WBC 04/18/2018 2.7* 4.0 - 10.5 K/uL Final  . RBC 04/18/2018 4.49  3.87 - 5.11 MIL/uL Final  . Hemoglobin 04/18/2018 13.5  12.0 - 15.0 g/dL Final  . HCT 04/18/2018 39.7  36.0 - 46.0 % Final  . MCV 04/18/2018 88.4  80.0 - 100.0 fL Final  . MCH 04/18/2018 30.1  26.0 - 34.0 pg Final  . MCHC 04/18/2018 34.0  30.0 - 36.0 g/dL Final  . RDW 04/18/2018 12.6  11.5 - 15.5 % Final  . Platelets 04/18/2018 120* 150 - 400 K/uL  Final   Comment: SPECIMEN CHECKED FOR CLOTS Immature Platelet Fraction may be clinically indicated, consider ordering this additional test [id number] CONSISTENT WITH PREVIOUS RESULT   . nRBC 04/18/2018 0.0  0.0 - 0.2 % Final  . Neutrophils Relative % 04/18/2018 64  % Final  . Neutro Abs 04/18/2018 1.7  1.7 - 7.7 K/uL Final  . Lymphocytes Relative 04/18/2018 20  % Final  . Lymphs Abs 04/18/2018 0.5* 0.7 - 4.0 K/uL Final  . Monocytes Relative 04/18/2018 13  % Final  . Monocytes Absolute 04/18/2018 0.3  0.1 - 1.0 K/uL Final  . Eosinophils Relative 04/18/2018 3  % Final  . Eosinophils Absolute 04/18/2018 0.1  0.0 - 0.5 K/uL Final  . Basophils Relative 04/18/2018 0  % Final  . Basophils Absolute 04/18/2018 0.0  0.0 - 0.1 K/uL Final  . Immature Granulocytes 04/18/2018 0  % Final  . Abs Immature Granulocytes 04/18/2018 0.01  0.00 - 0.07 K/uL Final   Performed at [facility], [address], [place], [place] [address]  . Sodium 04/18/2018 138  135 - 145 mmol/L Final  . Potassium 04/18/2018 3.9  3.5 - 5.1 mmol/L Final  . Chloride 04/18/2018 105  98 - 111 mmol/L Final  . CO2 04/18/2018 26  22 - 32 mmol/L Final  . Glucose, Bld 04/18/2018 108* 70 - 99 mg/dL Final  . BUN 04/18/2018 25* 8 - 23 mg/dL Final  . Creatinine, Ser 04/18/2018 0.82  0.44 - 1.00 mg/dL Final  . Calcium 04/18/2018 9.2  8.9 - 10.3 mg/dL Final  . Total Protein 04/18/2018 6.5  6.5 - 8.1 g/dL Final  . Albumin 04/18/2018 3.9  3.5 -  5.0 g/dL Final  . AST 04/18/2018 21  15 - 41 U/L Final  . ALT 04/18/2018 14  0 - 44 U/L Final  . Alkaline Phosphatase 04/18/2018 46  38 - 126 U/L Final  . Total Bilirubin 04/18/2018 0.7  0.3 - 1.2 mg/dL Final  . GFR calc non Af Amer 04/18/2018 >60  >60 mL/min Final  . GFR calc Af Amer 04/18/2018 >60  >60 mL/min Final  . Anion gap 04/18/2018 7  5 - 15 Final   Performed at [facility], [address], [place], [place] [address]  . LDH 04/18/2018 140  98 - 192 U/L Final   Performed at [facility], [address], [place], [place] [address]     Pathology Orders Placed This Encounter  Procedures  . NM PET Image Restag (PS) Skull Base To Thigh    Standing Status:   Future    Standing Expiration Date:   04/18/2019    Order Specific Question:   If indicated for the ordered procedure, I authorize the administration of a radiopharmaceutical per Radiology protocol    Answer:   Yes    Order Specific Question:   Preferred imaging location?    Answer:   [facility]    Order Specific Question:   Radiology Contrast Protocol - do NOT remove file path    Answer:   [email or web address]  . CBC with Differential/Platelet    Standing Status:   Future    Standing Expiration Date:   04/18/2020  . Comprehensive metabolic panel    Standing Status:   Future    Standing Expiration Date:   04/18/2020  . Lactate dehydrogenase    Standing Status:   Future    Standing Expiration Date:   04/18/2020       Zoila Shutter MD

## 2018-05-26 ENCOUNTER — Telehealth: Payer: Self-pay | Admitting: Cardiovascular Disease

## 2018-05-26 ENCOUNTER — Ambulatory Visit: Payer: Medicare Other | Admitting: Cardiovascular Disease

## 2018-05-26 ENCOUNTER — Encounter: Payer: Self-pay | Admitting: *Deleted

## 2018-05-26 ENCOUNTER — Encounter: Payer: Self-pay | Admitting: Cardiovascular Disease

## 2018-05-26 VITALS — BP 124/78 | HR 72 | Ht 67.5 in | Wt 171.0 lb

## 2018-05-26 DIAGNOSIS — R000 Tachycardia, unspecified: Secondary | ICD-10-CM

## 2018-05-26 DIAGNOSIS — I700 Atherosclerosis of aorta: Secondary | ICD-10-CM | POA: Diagnosis not present

## 2018-05-26 DIAGNOSIS — R002 Palpitations: Secondary | ICD-10-CM

## 2018-05-26 DIAGNOSIS — R5383 Other fatigue: Secondary | ICD-10-CM

## 2018-05-26 NOTE — Patient Instructions (Signed)
Medication Instructions:  Continue all current medications.  Labwork: none  Testing/Procedures:  Your physician has requested that you have an exercise stress myoview. For further information please visit [email or web address]. Please follow instruction sheet, as given.  Office will contact with results via phone or letter.    Follow-Up: 3 months   Any Other Special Instructions Will Be Listed Below (If Applicable).  If you need a refill on your cardiac medications before your next appointment, please call your pharmacy.  

## 2018-05-26 NOTE — Telephone Encounter (Signed)
Pre-cert Verification for the following procedure    Exercise stress myoview - scheduled for 06/08/2018 at [facility]

## 2018-05-26 NOTE — Progress Notes (Signed)
SUBJECTIVE: Patient presents for follow-up of palpitations. 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  Echocardiogram 07/07/2017 showed normal left ventricular systolic function and normal regional wall motion, LVEF 55 to 60%.  I started metoprolol 25 mg twice daily at a prior visit.  She developed dizziness so I reduced the dose to 12.5 mg twice daily at her visit on 08/26/2017.  She has a history of leukopenia and thrombocytopenia.  She also has hypertriglyceridemia.  She has been feeling fatigued over the last several months.  She denies chest pain and exertional dyspnea.  She denies a history of snoring and sleep apnea.  Palpitations are well controlled.  ECG performed in the office today which I ordered and personally interpreted demonstrates normal sinus rhythm with no ischemic ST segment or T-wave abnormalities, nor any arrhythmias.  Low voltage was noted.     Review of Systems: As per "subjective", otherwise negative.  Allergies  Allergen Reactions  . Adhesive [Tape] Rash    Reports rash and blistering if any adhesive tape  . Latex Rash    Rash and skin blistering per patient report    Current Outpatient Medications  Medication Sig Dispense Refill  . acetaminophen-codeine (TYLENOL #3) 300-30 MG tablet Take 1 tablet by mouth every 6 (six) hours as needed for moderate pain.    Marland Kitchen dexlansoprazole (DEXILANT) 60 MG capsule Take 60 mg by mouth once a week.    . docusate sodium (COLACE) 100 MG capsule Take 100 mg by mouth at bedtime.     Marland Kitchen levothyroxine (SYNTHROID) 88 MCG tablet Take 1 tablet (88 mcg total) by mouth daily before breakfast. 90 tablet 2  . metoprolol tartrate (LOPRESSOR) 25 MG tablet Take 0.5 tablets (12.5 mg total) by mouth 2 (two) times daily. 90 tablet 3  . ondansetron (ZOFRAN-ODT) 8 MG disintegrating tablet Take 8 mg by mouth daily. Pt only takes once a day     No current facility-administered medications for this visit.     Past Medical  History:  Diagnosis Date  . Arthritis    lower back  . Constipation   . Diverticulitis   . Ectopic pregnancy 1977  . Esophageal cancer ([place]) 2015   tongue cancer, throat cancer  . GERD (gastroesophageal reflux disease)   . Headache    migraines  . Hypertension    no longer taking meds  . Hypokalemia 2015  . Hypothyroidism   . Lung nodule, multiple 01/08/2015  . Macular degeneration of right eye   . Malignant neoplasm of tongue ([place]) 01/24/2014  . Thyroid disease     Past Surgical History:  Procedure Laterality Date  . BIOPSY PHARYNX  2015  . BREAST BIOPSY Bilateral    benign  . CHOLECYSTECTOMY  2005  . ESOPHAGOGASTRODUODENOSCOPY  06/19/2014   Dr. Britta Mccreedy: LA Class B esophagitis. PEG tube in place. erythema in proximal esophagus.   . ESOPHAGOGASTRODUODENOSCOPY (EGD) WITH PROPOFOL N/A 03/11/2017   Web in upper third of esophagus s/p dilation with scope passage, small hiatal hernia, normal duodenum, no specimens collected  . FUDUCIAL PLACEMENT N/A 10/28/2015   Procedure: PLACEMENT OF FUDUCIAL TIMES THREE; LEFT UPPER LOBE BIOPSY;  Surgeon: Grace Isaac, MD;  Location: [facility];  Service: Thoracic;  Laterality: N/A;  . MASTECTOMY, PARTIAL Left 1984   no cancer, fibrocystic breast disease  . SKIN SURGERY  1960   moles removed from various areas per patient report  . TONSILLECTOMY  1950   per patient report  . TUBAL  LIGATION  1976  . VAGINAL HYSTERECTOMY  1981   vaginal hyst per patient report  . VIDEO BRONCHOSCOPY WITH ENDOBRONCHIAL NAVIGATION N/A 10/28/2015   Procedure: VIDEO BRONCHOSCOPY WITH ENDOBRONCHIAL NAVIGATION;  Surgeon: Grace Isaac, MD;  Location[facility];  Service: Thoracic;  Laterality: N/A;    Social History   Socioeconomic History  . Marital status: Married    Spouse name: Not on file  . Number of children: Not on file  . Years of education: Not on file  . Highest education level: Not on file  Occupational History  . Not on file  Social Needs  .  Financial resource strain: Not on file  . Food insecurity:    Worry: Not on file    Inability: Not on file  . Transportation needs:    Medical: Not on file    Non-medical: Not on file  Tobacco Use  . Smoking status: Former Smoker    Packs/day: 1.00    Years: 23.00    Pack years: 23.00    Types: Cigarettes    Last attempt to quit: 05/03/1982    Years since quitting: 36.0  . Smokeless tobacco: Never Used  Substance and Sexual Activity  . Alcohol use: No    Alcohol/week: 0.0 standard drinks  . Drug use: No  . Sexual activity: Never    Birth control/protection: None  Lifestyle  . Physical activity:    Days per week: Not on file    Minutes per session: Not on file  . Stress: Not on file  Relationships  . Social connections:    Talks on phone: Not on file    Gets together: Not on file    Attends religious service: Not on file    Active member of club or organization: Not on file    Attends meetings of clubs or organizations: Not on file    Relationship status: Not on file  . Intimate partner violence:    Fear of current or ex partner: Not on file    Emotionally abused: Not on file    Physically abused: Not on file    Forced sexual activity: Not on file  Other Topics Concern  . Not on file  Social History Narrative  . Not on file     Vitals:   05/26/18 1328  BP: 124/78  Pulse: 72  SpO2: 98%  Weight: 171 lb (77.6 kg)  Height: 5' 7.5" (1.715 m)    Wt Readings from Last 3 Encounters:  05/26/18 171 lb (77.6 kg)  04/18/18 171 lb 9.6 oz (77.8 kg)  03/23/18 170 lb (77.1 kg)     PHYSICAL EXAM General: NAD HEENT: Normal. Neck: No JVD, no thyromegaly. Lungs: Clear to auscultation bilaterally with normal respiratory effort. CV: Regular rate and rhythm, normal S1/S2, no S3/S4, no murmur. No pretibial or periankle edema.  No carotid bruit.   Abdomen: Soft, nontender, no distention.  Neurologic: Alert and oriented.  Psych: Normal affect. Skin:  Normal. Musculoskeletal: No gross deformities.    ECG: Reviewed above under Subjective   Labs: Lab Results  Component Value Date/Time   K 3.9 04/18/2018 12:05 PM   BUN 25 (H) 04/18/2018 12:05 PM   CREATININE 0.82 04/18/2018 12:05 PM   ALT 14 04/18/2018 12:05 PM   TSH 2.146 07/16/2017 01:49 PM   HGB 13.5 04/18/2018 12:05 PM     Lipids: No results found for: LDLCALC, LDLDIRECT, CHOL, TRIG, HDL     ASSESSMENT AND PLAN: 1.  Fatigue: Unclear  etiology.  No evidence of anemia and TSH is normal.  She does have aortic atherosclerosis noted on a PET scan on 08/02/2017.  I will obtain an exercise Myoview stress test to evaluate for occult ischemic heart disease.  I will have her hold Lopressor the morning of the study.  2. Palpitations/inappropriate sinus tachycardia with dizziness: Symptomatically improved on metoprolol tartrate 12.5 mg twice daily.  3. Aortic atherosclerosis: This was an incidental finding on a PET scan on 08/02/2017 which I previously reviewed with her. Given her fatigue, I will obtain an exercise Myoview to evaluate for occult ischemic heart disease.   Disposition: Follow up 3 months   Kate Sable, M.D., F.A.C.C.

## 2018-06-08 ENCOUNTER — Encounter (HOSPITAL_COMMUNITY): Payer: Medicare Other

## 2018-06-08 ENCOUNTER — Ambulatory Visit (HOSPITAL_COMMUNITY): Payer: Medicare Other

## 2018-08-10 ENCOUNTER — Other Ambulatory Visit (HOSPITAL_COMMUNITY): Payer: Self-pay | Admitting: *Deleted

## 2018-08-10 DIAGNOSIS — C029 Malignant neoplasm of tongue, unspecified: Secondary | ICD-10-CM

## 2018-08-15 ENCOUNTER — Other Ambulatory Visit (HOSPITAL_COMMUNITY): Payer: Medicare Other

## 2018-08-15 ENCOUNTER — Ambulatory Visit (HOSPITAL_COMMUNITY): Payer: Medicare Other

## 2018-08-17 ENCOUNTER — Ambulatory Visit (HOSPITAL_COMMUNITY): Payer: Medicare Other | Admitting: Hematology

## 2018-08-22 ENCOUNTER — Ambulatory Visit (HOSPITAL_COMMUNITY): Payer: Medicare Other | Admitting: Hematology

## 2018-09-05 ENCOUNTER — Ambulatory Visit: Payer: Medicare Other | Admitting: Cardiovascular Disease

## 2018-09-29 ENCOUNTER — Other Ambulatory Visit (HOSPITAL_COMMUNITY): Payer: Self-pay | Admitting: *Deleted

## 2018-09-29 DIAGNOSIS — Z1231 Encounter for screening mammogram for malignant neoplasm of breast: Secondary | ICD-10-CM

## 2018-10-28 ENCOUNTER — Other Ambulatory Visit (HOSPITAL_COMMUNITY): Payer: Self-pay | Admitting: Hematology

## 2018-10-28 DIAGNOSIS — C029 Malignant neoplasm of tongue, unspecified: Secondary | ICD-10-CM

## 2018-11-07 ENCOUNTER — Other Ambulatory Visit (HOSPITAL_COMMUNITY): Payer: Medicare Other

## 2018-11-07 ENCOUNTER — Other Ambulatory Visit: Payer: Self-pay | Admitting: Cardiovascular Disease

## 2018-11-09 ENCOUNTER — Ambulatory Visit (HOSPITAL_COMMUNITY): Payer: Medicare Other | Admitting: Hematology

## 2018-11-15 ENCOUNTER — Other Ambulatory Visit (HOSPITAL_COMMUNITY): Payer: Self-pay | Admitting: *Deleted

## 2018-11-15 DIAGNOSIS — C029 Malignant neoplasm of tongue, unspecified: Secondary | ICD-10-CM

## 2018-11-15 DIAGNOSIS — D696 Thrombocytopenia, unspecified: Secondary | ICD-10-CM

## 2018-11-16 ENCOUNTER — Inpatient Hospital Stay (HOSPITAL_COMMUNITY): Payer: Medicare Other | Attending: Hematology

## 2018-11-16 ENCOUNTER — Ambulatory Visit (HOSPITAL_COMMUNITY)
Admission: RE | Admit: 2018-11-16 | Discharge: 2018-11-16 | Disposition: A | Discharge status: Home / Self Care | Payer: Medicare Other | Source: Ambulatory Visit | Attending: Hematology | Admitting: Hematology

## 2018-11-16 ENCOUNTER — Other Ambulatory Visit: Payer: Self-pay

## 2018-11-16 ENCOUNTER — Ambulatory Visit (HOSPITAL_COMMUNITY)
Admission: RE | Admit: 2018-11-16 | Discharge: 2018-11-16 | Disposition: A | Discharge status: Home / Self Care | Payer: Medicare Other | Source: Ambulatory Visit | Attending: *Deleted | Admitting: *Deleted

## 2018-11-16 DIAGNOSIS — C029 Malignant neoplasm of tongue, unspecified: Secondary | ICD-10-CM

## 2018-11-16 DIAGNOSIS — Z79899 Other long term (current) drug therapy: Secondary | ICD-10-CM | POA: Insufficient documentation

## 2018-11-16 DIAGNOSIS — Z923 Personal history of irradiation: Secondary | ICD-10-CM | POA: Diagnosis not present

## 2018-11-16 DIAGNOSIS — D61818 Other pancytopenia: Secondary | ICD-10-CM | POA: Diagnosis not present

## 2018-11-16 DIAGNOSIS — D696 Thrombocytopenia, unspecified: Secondary | ICD-10-CM

## 2018-11-16 DIAGNOSIS — Z87891 Personal history of nicotine dependence: Secondary | ICD-10-CM | POA: Diagnosis not present

## 2018-11-16 DIAGNOSIS — Z1231 Encounter for screening mammogram for malignant neoplasm of breast: Secondary | ICD-10-CM | POA: Diagnosis present

## 2018-11-16 DIAGNOSIS — R1310 Dysphagia, unspecified: Secondary | ICD-10-CM | POA: Diagnosis not present

## 2018-11-16 DIAGNOSIS — Z9221 Personal history of antineoplastic chemotherapy: Secondary | ICD-10-CM | POA: Insufficient documentation

## 2018-11-16 DIAGNOSIS — M549 Dorsalgia, unspecified: Secondary | ICD-10-CM | POA: Insufficient documentation

## 2018-11-16 LAB — CBC WITH DIFFERENTIAL/PLATELET
Abs Immature Granulocytes: 0 10*3/uL (ref 0.00–0.07)
Basophils Absolute: 0 10*3/uL (ref 0.0–0.1)
Basophils Relative: 1 %
Eosinophils Absolute: 0.1 10*3/uL (ref 0.0–0.5)
Eosinophils Relative: 3 %
HCT: 40.5 % (ref 36.0–46.0)
Hemoglobin: 13.7 g/dL (ref 12.0–15.0)
Immature Granulocytes: 0 %
Lymphocytes Relative: 23 %
Lymphs Abs: 0.6 10*3/uL — ABNORMAL LOW (ref 0.7–4.0)
MCH: 30.1 pg (ref 26.0–34.0)
MCHC: 33.8 g/dL (ref 30.0–36.0)
MCV: 89 fL (ref 80.0–100.0)
Monocytes Absolute: 0.3 10*3/uL (ref 0.1–1.0)
Monocytes Relative: 10 %
Neutro Abs: 1.5 10*3/uL — ABNORMAL LOW (ref 1.7–7.7)
Neutrophils Relative %: 63 %
Platelets: 117 10*3/uL — ABNORMAL LOW (ref 150–400)
RBC: 4.55 MIL/uL (ref 3.87–5.11)
RDW: 12.7 % (ref 11.5–15.5)
WBC: 2.5 10*3/uL — ABNORMAL LOW (ref 4.0–10.5)
nRBC: 0 % (ref 0.0–0.2)

## 2018-11-16 LAB — COMPREHENSIVE METABOLIC PANEL
ALT: 17 U/L (ref 0–44)
AST: 22 U/L (ref 15–41)
Albumin: 4 g/dL (ref 3.5–5.0)
Alkaline Phosphatase: 49 U/L (ref 38–126)
Anion gap: 6 (ref 5–15)
BUN: 25 mg/dL — ABNORMAL HIGH (ref 8–23)
CO2: 28 mmol/L (ref 22–32)
Calcium: 9.5 mg/dL (ref 8.9–10.3)
Chloride: 104 mmol/L (ref 98–111)
Creatinine, Ser: 0.91 mg/dL (ref 0.44–1.00)
GFR calc Af Amer: >60 mL/min (ref >60)
GFR calc non Af Amer: >60 mL/min (ref >60)
Glucose, Bld: 126 mg/dL — ABNORMAL HIGH (ref 70–99)
Potassium: 3.7 mmol/L (ref 3.5–5.1)
Sodium: 138 mmol/L (ref 135–145)
Total Bilirubin: 0.5 mg/dL (ref 0.3–1.2)
Total Protein: 6.7 g/dL (ref 6.5–8.1)

## 2018-11-16 LAB — LACTATE DEHYDROGENASE: LDH: 155 U/L (ref 98–192)

## 2018-11-16 MED ORDER — IOHEXOL 300 MG/ML  SOLN
75.0000 mL | Freq: Once | INTRAMUSCULAR | Status: AC | PRN
  Administered 2018-11-16: 75 mL via INTRAVENOUS

## 2018-11-21 ENCOUNTER — Other Ambulatory Visit: Payer: Self-pay

## 2018-11-21 ENCOUNTER — Inpatient Hospital Stay (HOSPITAL_COMMUNITY): Payer: Medicare Other | Admitting: Hematology

## 2018-11-21 ENCOUNTER — Other Ambulatory Visit (HOSPITAL_COMMUNITY): Payer: Self-pay | Admitting: Internal Medicine

## 2018-11-21 ENCOUNTER — Encounter (HOSPITAL_COMMUNITY): Payer: Self-pay | Admitting: Hematology

## 2018-11-21 VITALS — BP 106/53 | HR 75 | Temp 98.2°F | Resp 20 | Wt 168.0 lb

## 2018-11-21 DIAGNOSIS — Z79899 Other long term (current) drug therapy: Secondary | ICD-10-CM

## 2018-11-21 DIAGNOSIS — R928 Other abnormal and inconclusive findings on diagnostic imaging of breast: Secondary | ICD-10-CM

## 2018-11-21 DIAGNOSIS — Z923 Personal history of irradiation: Secondary | ICD-10-CM

## 2018-11-21 DIAGNOSIS — C029 Malignant neoplasm of tongue, unspecified: Secondary | ICD-10-CM | POA: Diagnosis not present

## 2018-11-21 DIAGNOSIS — D61818 Other pancytopenia: Secondary | ICD-10-CM | POA: Diagnosis not present

## 2018-11-21 DIAGNOSIS — R1310 Dysphagia, unspecified: Secondary | ICD-10-CM | POA: Diagnosis not present

## 2018-11-21 DIAGNOSIS — M549 Dorsalgia, unspecified: Secondary | ICD-10-CM | POA: Diagnosis not present

## 2018-11-21 DIAGNOSIS — Z9221 Personal history of antineoplastic chemotherapy: Secondary | ICD-10-CM

## 2018-11-21 DIAGNOSIS — Z87891 Personal history of nicotine dependence: Secondary | ICD-10-CM

## 2018-11-21 NOTE — Assessment & Plan Note (Addendum)
1.  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  No enlarged lymph nodes. -Physical examination did not reveal any oropharyngeal masses.  There is a white coating of the tongue which is stable.  Her neck has signs of fibrosis. -We will see her back in 1 year for follow-up with labs including TSH.  2.  Nutrition: - She is not able to eat any solid foods since she completed chemoradiation therapy in December 2015. -She is drinking 8 ounces of Ensure with 8 ounces of milk 3 times a day.  Her weight has been stable.  3.  Pancytopenia: -She has leukopenia and thrombocytopenia since she finished chemoradiation therapy. - Latest blood counts show white count of 2.5 with ANC of 1500.  Platelet count is 117. -She does not have any recurrent infections or bleeding issues. -Bone marrow biopsy on 03/30/2018 shows normocellular marrow with erythroid hyperplasia and minimal dyspoiesis.  4.  Health maintenance: -Last mammogram on 11/16/2018 was BI-RADS 0. -Ultrasound was recommended.

## 2018-11-21 NOTE — Progress Notes (Signed)
[address] [place], [place] [address]   CLINIC:  Medical Oncology/Hematology  PCP:  Octavio Graves, DO (Inactive) [place] [address] [phone]   REASON FOR VISIT:  Follow-up for tongue cancer and pancytopenia.   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   01/19/2014 PET scan   Tongue mass with 2 level II Right neck nodes T2N2b, Stage IVA   02/08/2014 - 04/03/2014 Radiation Therapy   Initiation of radiation therapy with 70 Gy delivered to the right base of tongue/tonsil 63 Gy to high risk nodal echelons, 56 Gy to the intermediate r base of tongue/tonsil/bilateral neck nodes   02/08/2014 - 03/15/2014 Chemotherapy   weekly cisplatin, only 4 doses given, severe toxicity with neutropenia, dehydration, nausea, vomiting, hospitalization and obstipation   10/24/2015 PET scan   low level non malig range hyper metab in L apical sub solid pulm nodule, suspicious for low grade adeno. diffuse tongue hypermetab without CT correlation   10/28/2015 Procedure   Bronchoscopy navigation, electromagnetic navigation, bronchoscopy with biopsy of LUL lung lesion, placement of fiducial markers X 3   10/28/2015 Pathology Results   Scant benign lung tissue, no tumor seen      CANCER STAGING: Cancer Staging No matching staging information was found for the patient.   INTERVAL HISTORY:  Ms. Flaming 78 y.o. female returns for follow-up of tongue cancer and pancytopenia.  Denies any fevers, night sweats or weight loss in the last 6 months.  She is continuing to drink 8 ounces can of Ensure with 8 ounces of milk 3 times a day.  She cannot eat any solid food since chemoradiation therapy was completed.  Denies any infections or fevers in the last 6 months.  No ER visits or hospitalizations.  Energy levels are 75%.  Pain in the lower back is  rated as 1 out of 10 which is chronic.    REVIEW OF SYSTEMS:  Review of Systems  HENT:   Positive for trouble swallowing.   Musculoskeletal: Positive for back pain.  All other systems reviewed and are negative.    PAST MEDICAL/SURGICAL HISTORY:  Past Medical History:  Diagnosis Date  . Arthritis    lower back  . Constipation   . Diverticulitis   . Ectopic pregnancy 1977  . Esophageal cancer ([place]) 2015   tongue cancer, throat cancer  . GERD (gastroesophageal reflux disease)   . Headache    migraines  . Hypertension    no longer taking meds  . Hypokalemia 2015  . Hypothyroidism   . Lung nodule, multiple 01/08/2015  . Macular degeneration of right eye   . Malignant neoplasm of tongue ([facility]) 01/24/2014  . Thyroid disease    Past Surgical History:  Procedure Laterality Date  . BIOPSY PHARYNX  2015  . BREAST BIOPSY Bilateral    benign  . CHOLECYSTECTOMY  2005  . ESOPHAGOGASTRODUODENOSCOPY  06/19/2014   Dr. Britta Mccreedy: LA Class B esophagitis. PEG tube in place. erythema in proximal esophagus.   . ESOPHAGOGASTRODUODENOSCOPY (EGD) WITH PROPOFOL N/A 03/11/2017   Web in upper third of esophagus s/p dilation with scope passage, small hiatal hernia, normal duodenum, no specimens collected  . FUDUCIAL PLACEMENT N/A 10/28/2015   Procedure: PLACEMENT OF FUDUCIAL TIMES THREE; LEFT UPPER LOBE BIOPSY;  Surgeon: Grace Isaac, MD;  Location: [facility];  Service: Thoracic;  Laterality: N/A;  .  MASTECTOMY, PARTIAL Left 1984   no cancer, fibrocystic breast disease  . SKIN SURGERY  1960   moles removed from various areas per patient report  . TONSILLECTOMY  1950   per patient report  . TUBAL LIGATION  1976  . VAGINAL HYSTERECTOMY  1981   vaginal hyst per patient report  . VIDEO BRONCHOSCOPY WITH ENDOBRONCHIAL NAVIGATION N/A 10/28/2015   Procedure: VIDEO BRONCHOSCOPY WITH ENDOBRONCHIAL NAVIGATION;  Surgeon: Grace Isaac, MD;  Location: [facility];  Service: Thoracic;  Laterality: N/A;      SOCIAL HISTORY:  Social History   Socioeconomic History  . Marital status: Married    Spouse name: Not on file  . Number of children: Not on file  . Years of education: Not on file  . Highest education level: Not on file  Occupational History  . Not on file  Social Needs  . Financial resource strain: Not on file  . Food insecurity    Worry: Not on file    Inability: Not on file  . Transportation needs    Medical: Not on file    Non-medical: Not on file  Tobacco Use  . Smoking status: Former Smoker    Packs/day: 1.00    Years: 23.00    Pack years: 23.00    Types: Cigarettes    Quit date: 05/03/1982    Years since quitting: 36.5  . Smokeless tobacco: Never Used  Substance and Sexual Activity  . Alcohol use: No    Alcohol/week: 0.0 standard drinks  . Drug use: No  . Sexual activity: Never    Birth control/protection: None  Lifestyle  . Physical activity    Days per week: Not on file    Minutes per session: Not on file  . Stress: Not on file  Relationships  . Social Herbalist on phone: Not on file    Gets together: Not on file    Attends religious service: Not on file    Active member of club or organization: Not on file    Attends meetings of clubs or organizations: Not on file    Relationship status: Not on file  . Intimate partner violence    Fear of current or ex partner: Not on file    Emotionally abused: Not on file    Physically abused: Not on file    Forced sexual activity: Not on file  Other Topics Concern  . Not on file  Social History Narrative  . Not on file    FAMILY HISTORY:  History reviewed. No pertinent family history.  CURRENT MEDICATIONS:  Outpatient Encounter Medications as of 11/21/2018  Medication Sig  . levothyroxine (SYNTHROID) 88 MCG tablet Take 1 tablet (88 mcg total) by mouth daily before breakfast.  . metoprolol tartrate (LOPRESSOR) 25 MG tablet Take 1 tablet by mouth twice daily (Patient taking differently: Pt states  she takes 1/2 in am and 1/2 in pm)  . ondansetron (ZOFRAN-ODT) 8 MG disintegrating tablet Take 8 mg by mouth daily. Pt only takes once a day  . [DISCONTINUED] acetaminophen-codeine (TYLENOL #3) 300-30 MG tablet Take 1 tablet by mouth every 6 (six) hours as needed for moderate pain.  . [DISCONTINUED] dexlansoprazole (DEXILANT) 60 MG capsule Take 60 mg by mouth once a week.  . [DISCONTINUED] docusate sodium (COLACE) 100 MG capsule Take 100 mg by mouth at bedtime.    No facility-administered encounter medications on file as of 11/21/2018.     ALLERGIES:  Allergies  Allergen  Reactions  . Adhesive [Tape] Rash    Reports rash and blistering if any adhesive tape  . Latex Rash    Rash and skin blistering per patient report     PHYSICAL EXAM:  ECOG Performance status: 1  Vitals:   11/21/18 1150 11/21/18 1152  BP:  (!) 106/53  Pulse: 75   Resp: 20   Temp: 98.2 F (36.8 C)   SpO2: 98%    Filed Weights   11/21/18 1150  Weight: 168 lb (76.2 kg)    Physical Exam Vitals signs reviewed.  Constitutional:      Appearance: Normal appearance.  HENT:     Mouth/Throat:     Mouth: Mucous membranes are moist.     Pharynx: Oropharyngeal exudate present.  Cardiovascular:     Rate and Rhythm: Normal rate and regular rhythm.     Heart sounds: Normal heart sounds.  Pulmonary:     Effort: Pulmonary effort is normal.     Breath sounds: Normal breath sounds.  Abdominal:     General: There is no distension.     Palpations: Abdomen is soft. There is no mass.  Musculoskeletal:        General: No swelling.  Lymphadenopathy:     Cervical: No cervical adenopathy.  Skin:    General: Skin is warm.  Neurological:     General: No focal deficit present.     Mental Status: She is alert and oriented to person, place, and time.  Psychiatric:        Mood and Affect: Mood normal.        Behavior: Behavior normal.      LABORATORY DATA:  I have reviewed the labs as listed.  CBC    Component  Value Date/Time   WBC 2.5 (L) 11/16/2018 1147   RBC 4.55 11/16/2018 1147   HGB 13.7 11/16/2018 1147   HCT 40.5 11/16/2018 1147   PLT 117 (L) 11/16/2018 1147   MCV 89.0 11/16/2018 1147   MCH 30.1 11/16/2018 1147   MCHC 33.8 11/16/2018 1147   RDW 12.7 11/16/2018 1147   LYMPHSABS 0.6 (L) 11/16/2018 1147   MONOABS 0.3 11/16/2018 1147   EOSABS 0.1 11/16/2018 1147   BASOSABS 0.0 11/16/2018 1147   CMP Latest Ref Rng & Units 11/16/2018 04/18/2018 03/16/2018  Glucose 70 - 99 mg/dL 126(H) 108(H) 98  BUN 8 - 23 mg/dL 25(H) 25(H) 20  Creatinine 0.44 - 1.00 mg/dL 0.91 0.82 0.83  Sodium 135 - 145 mmol/L 138 138 138  Potassium 3.5 - 5.1 mmol/L 3.7 3.9 3.8  Chloride 98 - 111 mmol/L 104 105 106  CO2 22 - 32 mmol/L 28 26 26   Calcium 8.9 - 10.3 mg/dL 9.5 9.2 9.4  Total Protein 6.5 - 8.1 g/dL 6.7 6.5 7.1  Total Bilirubin 0.3 - 1.2 mg/dL 0.5 0.7 1.1  Alkaline Phos 38 - 126 U/L 49 46 51  AST 15 - 41 U/L 22 21 22   ALT 0 - 44 U/L 17 14 13        DIAGNOSTIC IMAGING:  I have independently reviewed the scans and discussed with the patient.   I have reviewed Venita Lick LPN's note and agree with the documentation.  I personally performed a face-to-face visit, made revisions and my assessment and plan is as follows.    ASSESSMENT & PLAN:   Malignant neoplasm of tongue ([facility]) 1.  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  No enlarged lymph nodes. -Physical examination did not reveal any oropharyngeal masses.  There is a white coating of the tongue which is stable.  Her neck has signs of fibrosis. -We will see her back in 1 year for follow-up with labs including TSH.  2.  Nutrition: - She is not able to eat any solid foods since she completed chemoradiation therapy in December 2015.  -She is drinking 8 ounces of Ensure with 8 ounces of milk 3 times a day.  Her weight has been stable.  3.  Pancytopenia: -She has leukopenia and thrombocytopenia since she finished chemoradiation therapy. - Latest blood counts show white count of 2.5 with ANC of 1500.  Platelet count is 117. -She does not have any recurrent infections or bleeding issues. -Bone marrow biopsy on 03/30/2018 shows normocellular marrow with erythroid hyperplasia and minimal dyspoiesis.  4.  Health maintenance: -Last mammogram on 11/16/2018 was BI-RADS 0. -Ultrasound was recommended.   Total time spent is 25 minutes with more than 50% of the time spent face-to-face discussing surveillance plan, counseling and coordination of care.  Orders placed this encounter:  Orders Placed This Encounter  Procedures  . CBC with Differential/Platelet  . Comprehensive metabolic panel  . TSH      Derek Jack, [place] [phone]

## 2018-11-21 NOTE — Patient Instructions (Signed)
[facility] Discharge Instructions  You were seen today by Dr. Katragadda. He went over your recent lab results. He will see you back in 1 year for labs and follow up.   Thank you for choosing [facility] at [facility] to provide your oncology and hematology care.  To afford each patient quality time with our provider, please arrive at least 15 minutes before your scheduled appointment time.   If you have a lab appointment with the Cancer Center please come in thru the  Main Entrance and check in at the main information desk  You need to re-schedule your appointment should you arrive 10 or more minutes late.  We strive to give you quality time with our providers, and arriving late affects you and other patients whose appointments are after yours.  Also, if you no show three or more times for appointments you may be dismissed from the clinic at the providers discretion.     Again, thank you for choosing [facility].  Our hope is that these requests will decrease the amount of time that you wait before being seen by our physicians.       _____________________________________________________________  Should you have questions after your visit to [facility], please contact our office at [phone] between the hours of 8:00 a.m. and 4:30 p.m.  Voicemails left after 4:00 p.m. will not be returned until the following business day.  For prescription refill requests, have your pharmacy contact our office and allow 72 hours.    Cancer Center Support Programs:   > Cancer Support Group  2nd Tuesday of the month 1pm-2pm, Journey Room    

## 2018-12-06 ENCOUNTER — Other Ambulatory Visit: Payer: Self-pay

## 2018-12-06 ENCOUNTER — Ambulatory Visit (HOSPITAL_COMMUNITY)
Admission: RE | Admit: 2018-12-06 | Discharge: 2018-12-06 | Disposition: A | Discharge status: Home / Self Care | Payer: Medicare Other | Source: Ambulatory Visit | Attending: Internal Medicine | Admitting: Internal Medicine

## 2018-12-06 DIAGNOSIS — R928 Other abnormal and inconclusive findings on diagnostic imaging of breast: Secondary | ICD-10-CM

## 2018-12-13 ENCOUNTER — Telehealth: Payer: Self-pay | Admitting: Cardiovascular Disease

## 2018-12-13 NOTE — Telephone Encounter (Signed)
Virtual Visit Pre-Appointment Phone Call  "(Name), I am calling you today to discuss your upcoming appointment. We are currently trying to limit exposure to the virus that causes COVID-19 by seeing patients at home rather than in the office."  1. "What is the BEST phone number to call the day of the visit?" - include this in appointment notes  2. Do you have or have access to (through a family member/friend) a smartphone with video capability that we can use for your visit?" a. If yes - list this number in appt notes as cell (if different from BEST phone #) and list the appointment type as a VIDEO visit in appointment notes b. If no - list the appointment type as a PHONE visit in appointment notes  Confirm consent - "In the setting of the current Covid19 crisis, you are scheduled for a (phone or video) visit with your provider on (date) at (time).  Just as we do with many in-office visits, in order for you to participate in this visit, we must obtain consent.  If you'd like, I can send this to your mychart (if signed up) or email for you to review.  Otherwise, I can obtain your verbal consent now.  All virtual visits are billed to your insurance company just like a normal visit would be.  By agreeing to a virtual visit, we'd like you to understand that the technology does not allow for your provider to perform an examination, and thus may limit your provider's ability to fully assess your condition. If your provider identifies any concerns that need to be evaluated in person, we will make arrangements to do so.  Finally, though the technology is pretty good, we cannot assure that it will always work on either your or our end, and in the setting of a video visit, we may have to convert it to a phone-only visit.  In either situation, we cannot ensure that we have a secure connection.  Are you willing to proceed?" STAFF: Did the patient verbally acknowledge consent to telehealth visit? Document  YES/NO here: yes 3. Advise patient to be prepared - "Two hours prior to your appointment, go ahead and check your blood pressure, pulse, oxygen saturation, and your weight (if you have the equipment to check those) and write them all down. When your visit starts, your provider will ask you for this information. If you have an Apple Watch or Kardia device, please plan to have heart rate information ready on the day of your appointment. Please have a pen and paper handy nearby the day of the visit as well."  4. Give patient instructions for MyChart download to smartphone OR Doximity/Doxy.me as below if video visit (depending on what platform provider is using)  5. Inform patient they will receive a phone call 15 minutes prior to their appointment time (may be from unknown caller ID) so they should be prepared to answer    TELEPHONE CALL NOTE  Candace Lewis has been deemed a candidate for a follow-up tele-health visit to limit community exposure during the Covid-19 pandemic. I spoke with the patient via phone to ensure availability of phone/video source, confirm preferred email & phone number, and discuss instructions and expectations.  I reminded Candace Lewis to be prepared with any vital sign and/or heart rhythm information that could potentially be obtained via home monitoring, at the time of her visit. I reminded Candace Lewis to expect a phone call prior to her visit.  Candace Lewis 12/13/2018 1:54 PM   INSTRUCTIONS FOR DOWNLOADING THE MYCHART APP TO SMARTPHONE  - The patient must first make sure to have activated MyChart and know their login information - If Apple, go to [employer] and type in MyChart in the search bar and download the app. If Android, ask patient to go to [employer] and type in [place] in the search bar and download the app. The app is free but as with any other app downloads, their phone may require them to verify saved payment information or  Apple/Android password.  - The patient will need to then log into the app with their MyChart username and password, and select [facility] as their healthcare provider to link the account. When it is time for your visit, go to the MyChart app, find appointments, and click Begin Video Visit. Be sure to Select Allow for your device to access the Microphone and Camera for your visit. You will then be connected, and your provider will be with you shortly.  **If they have any issues connecting, or need assistance please contact MyChart service desk [phone])**  **If using a computer, in order to ensure the best quality for their visit they will need to use either of the following Internet Browsers: [employer], or Google Chrome**  IF USING DOXIMITY or DOXY.ME - The patient will receive a link just prior to their visit by text.     FULL LENGTH CONSENT FOR TELE-HEALTH VISIT   I hereby voluntarily request, consent and authorize [facility] and its employed or contracted physicians, physician assistants, nurse practitioners or other licensed health care professionals (the Practitioner), to provide me with telemedicine health care services (the Services") as deemed necessary by the treating Practitioner. I acknowledge and consent to receive the Services by the Practitioner via telemedicine. I understand that the telemedicine visit will involve communicating with the Practitioner through live audiovisual communication technology and the disclosure of certain medical information by electronic transmission. I acknowledge that I have been given the opportunity to request an in-person assessment or other available alternative prior to the telemedicine visit and am voluntarily participating in the telemedicine visit.  I understand that I have the right to withhold or withdraw my consent to the use of telemedicine in the course of my care at any time, without affecting my right to future care  or treatment, and that the Practitioner or I may terminate the telemedicine visit at any time. I understand that I have the right to inspect all information obtained and/or recorded in the course of the telemedicine visit and may receive copies of available information for a reasonable fee.  I understand that some of the potential risks of receiving the Services via telemedicine include:   Delay or interruption in medical evaluation due to technological equipment failure or disruption;  Information transmitted may not be sufficient (e.g. poor resolution of images) to allow for appropriate medical decision making by the Practitioner; and/or   In rare instances, security protocols could fail, causing a breach of personal health information.  Furthermore, I acknowledge that it is my responsibility to provide information about my medical history, conditions and care that is complete and accurate to the best of my ability. I acknowledge that Practitioner's advice, recommendations, and/or decision may be based on factors not within their control, such as incomplete or inaccurate data provided by me or distortions of diagnostic images or specimens that may result from electronic transmissions. I understand that the  practice of medicine is not an Chief Strategy Officer and that Practitioner makes no warranties or guarantees regarding treatment outcomes. I acknowledge that I will receive a copy of this consent concurrently upon execution via email to the email address I last provided but may also request a printed copy by calling the office of [facility].    I understand that my insurance will be billed for this visit.   I have read or had this consent read to me.  I understand the contents of this consent, which adequately explains the benefits and risks of the Services being provided via telemedicine.   I have been provided ample opportunity to ask questions regarding this consent and the Services and have had  my questions answered to my satisfaction.  I give my informed consent for the services to be provided through the use of telemedicine in my medical care  By participating in this telemedicine visit I agree to the above.

## 2018-12-19 ENCOUNTER — Encounter: Payer: Self-pay | Admitting: Cardiovascular Disease

## 2018-12-19 ENCOUNTER — Telehealth (INDEPENDENT_AMBULATORY_CARE_PROVIDER_SITE_OTHER): Payer: Medicare Other | Admitting: Cardiovascular Disease

## 2018-12-19 VITALS — BP 112/75 | HR 65 | Ht 67.5 in | Wt 168.0 lb

## 2018-12-19 DIAGNOSIS — R002 Palpitations: Secondary | ICD-10-CM | POA: Diagnosis not present

## 2018-12-19 DIAGNOSIS — R000 Tachycardia, unspecified: Secondary | ICD-10-CM

## 2018-12-19 NOTE — Progress Notes (Signed)
Virtual Visit via Telephone Note   This visit type was conducted due to national recommendations for restrictions regarding the COVID-19 Pandemic (e.g. social distancing) in an effort to limit this patient's exposure and mitigate transmission in our community.  Due to her co-morbid illnesses, this patient is at least at moderate risk for complications without adequate follow up.  This format is felt to be most appropriate for this patient at this time.  The patient did not have access to video technology/had technical difficulties with video requiring transitioning to audio format only (telephone).  All issues noted in this document were discussed and addressed.  No physical exam could be performed with this format.  Please refer to the patient's chart for her  consent to telehealth for [facility].   Date:  12/19/2018   ID:  Candace Lewis, DOB 1940-10-04, MRN [id number]  Patient Location: Home Provider Location: Office  PCP:  Octavio Graves, DO (Inactive)  Cardiologist:  Kate Sable, MD  Electrophysiologist:  None   Evaluation Performed:  Follow-Up Visit  Chief Complaint: Palpitations  History of Present Illness:    Candace Lewis is a 78 y.o. female with palpitations. 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  Echocardiogram 07/07/2017 showed normal left ventricular systolic function and normal regional wall motion, LVEF 55 to 60%.  I started metoprolol 25 mg twice daily ata priorvisit.She developed dizziness so I reduced the dose to 12.5 mg twice daily at her visit on 08/26/2017.  She has a history of leukopenia and thrombocytopenia.  She also has hypertriglyceridemia.  Due to complaints of fatigue at an office visit in January 2020, I ordered a stress test.  She did not pursue it.  She started vitamin D in January 2020.  The patient denies any symptoms of chest pain, palpitations, shortness of breath, lightheadedness, dizziness, leg swelling,  orthopnea, PND, and syncope.  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  The patient does not have symptoms concerning for COVID-19 infection (fever, chills, cough, or new shortness of breath).    Past Medical History:  Diagnosis Date  . Arthritis    lower back  . Constipation   . Diverticulitis   . Ectopic pregnancy 1977  . Esophageal cancer ([place]) 2015   tongue cancer, throat cancer  . GERD (gastroesophageal reflux disease)   . Headache    migraines  . Hypertension    no longer taking meds  . Hypokalemia 2015  . Hypothyroidism   . Lung nodule, multiple 01/08/2015  . Macular degeneration of right eye   . Malignant neoplasm of tongue ([place]) 01/24/2014  . Thyroid disease    Past Surgical History:  Procedure Laterality Date  . BIOPSY PHARYNX  2015  . BREAST BIOPSY Bilateral    benign  . CHOLECYSTECTOMY  2005  . ESOPHAGOGASTRODUODENOSCOPY  06/19/2014   Dr. Britta Mccreedy: LA Class B esophagitis. PEG tube in place. erythema in proximal esophagus.   . ESOPHAGOGASTRODUODENOSCOPY (EGD) WITH PROPOFOL N/A 03/11/2017   Web in upper third of esophagus s/p dilation with scope passage, small hiatal hernia, normal duodenum, no specimens collected  . FUDUCIAL PLACEMENT N/A 10/28/2015   Procedure: PLACEMENT OF FUDUCIAL TIMES THREE; LEFT UPPER LOBE BIOPSY;  Surgeon: Grace Isaac, MD;  Location: [facility];  Service: Thoracic;  Laterality: N/A;  . MASTECTOMY, PARTIAL Left 1984   no cancer, fibrocystic breast disease  .  SKIN SURGERY  1960   moles removed from various areas per patient report  . TONSILLECTOMY  1950   per patient report  . TUBAL LIGATION  1976  . VAGINAL HYSTERECTOMY  1981   vaginal hyst per patient report  . VIDEO BRONCHOSCOPY WITH ENDOBRONCHIAL NAVIGATION N/A 10/28/2015   Procedure: VIDEO BRONCHOSCOPY WITH ENDOBRONCHIAL NAVIGATION;  Surgeon: Grace Isaac, MD;  [facility];  Service: Thoracic;  Laterality: N/A;     Current Meds  Medication Sig  . docusate sodium (COLACE) 100 MG capsule Take 100 mg by mouth daily.  Marland Kitchen levothyroxine (SYNTHROID) 88 MCG tablet Take 1 tablet (88 mcg total) by mouth daily before breakfast.  . metoprolol tartrate (LOPRESSOR) 25 MG tablet Take 1 tablet by mouth twice daily (Patient taking differently: Pt states she takes 1/2 in am and 1/2 in pm)  . ondansetron (ZOFRAN-ODT) 8 MG disintegrating tablet Take 8 mg by mouth daily. Pt only takes once a day     Allergies:   Adhesive [tape] and Latex   Social History   Tobacco Use  . Smoking status: Former Smoker    Packs/day: 1.00    Years: 23.00    Pack years: 23.00    Types: Cigarettes    Quit date: 05/03/1982    Years since quitting: 36.6  . Smokeless tobacco: Never Used  Substance Use Topics  . Alcohol use: No    Alcohol/week: 0.0 standard drinks  . Drug use: No     Family Hx: The patient's family history is not on file.  ROS:   Please see the history of present illness.     All other systems reviewed and are negative.   Prior CV studies:   The following studies were reviewed today:    Labs/Other Tests and Data Reviewed:    EKG:  No ECG reviewed.  Recent Labs: 11/16/2018: ALT 17; BUN 25; Creatinine, Ser 0.91; Hemoglobin 13.7; Platelets 117; Potassium 3.7; Sodium 138   Recent Lipid Panel No results found for: CHOL, TRIG, HDL, CHOLHDL, LDLCALC, LDLDIRECT  Wt Readings from Last 3 Encounters:  12/19/18 168 lb (76.2 kg)  11/21/18 168 lb (76.2 kg)  05/26/18 171 lb (77.6 kg)     Objective:    Vital Signs:  BP 112/75   Pulse 65   Ht 5' 7.5" (1.715 m)   Wt 168 lb (76.2 kg)   BMI 25.92 kg/m    VITAL SIGNS:  reviewed  ASSESSMENT & PLAN:    1.  Palpitations/inappropriate sinus tachycardia: Symptomatically stable on Lopressor 12.5 mg twice daily.  She experienced dizziness with higher doses in the past.   COVID-19 Education: The signs and symptoms  of COVID-19 were discussed with the patient and how to seek care for testing (follow up with PCP or arrange E-visit).  The importance of social distancing was discussed today.  Time:   Today, I have spent 10 minutes with the patient with telehealth technology discussing the above problems.     Medication Adjustments/Labs and Tests Ordered: Current medicines are reviewed at length with the patient today.  Concerns regarding medicines are outlined above.   Tests Ordered: No orders of the defined types were placed in this encounter.   Medication Changes: No orders of the defined types were placed in this encounter.   Follow Up:  Virtual Visit or In Person in 1 year(s)  Signed, Kate Sable, MD  12/19/2018 10:05 AM    [place]

## 2018-12-19 NOTE — Patient Instructions (Signed)
Your physician wants you to follow-up in: 1 YEAR WITH DR KONESWARAN You will receive a reminder letter in the mail two months in advance. If you don't receive a letter, please call our office to schedule the follow-up appointment.  Your physician recommends that you continue on your current medications as directed. Please refer to the Current Medication list given to you today.  Thank you for choosing [facility]!!    

## 2019-07-26 ENCOUNTER — Encounter: Payer: Self-pay | Admitting: Cardiovascular Disease

## 2019-07-26 ENCOUNTER — Telehealth (INDEPENDENT_AMBULATORY_CARE_PROVIDER_SITE_OTHER): Payer: Medicare Other | Admitting: Cardiovascular Disease

## 2019-07-26 VITALS — BP 145/83 | HR 72 | Ht 67.5 in | Wt 165.0 lb

## 2019-07-26 DIAGNOSIS — R002 Palpitations: Secondary | ICD-10-CM

## 2019-07-26 DIAGNOSIS — R000 Tachycardia, unspecified: Secondary | ICD-10-CM

## 2019-07-26 DIAGNOSIS — I10 Essential (primary) hypertension: Secondary | ICD-10-CM | POA: Diagnosis not present

## 2019-07-26 MED ORDER — CARVEDILOL 3.125 MG PO TABS: 3.1250 mg | ORAL_TABLET | Freq: Two times a day (BID) | ORAL | 3 refills | Status: DC

## 2019-07-26 NOTE — Progress Notes (Signed)
Virtual Visit via Telephone Note   This visit type was conducted due to national recommendations for restrictions regarding the COVID-19 Pandemic (e.g. social distancing) in an effort to limit this patient's exposure and mitigate transmission in our community.  Due to her co-morbid illnesses, this patient is at least at moderate risk for complications without adequate follow up.  This format is felt to be most appropriate for this patient at this time.  The patient did not have access to video technology/had technical difficulties with video requiring transitioning to audio format only (telephone).  All issues noted in this document were discussed and addressed.  No physical exam could be performed with this format.  Please refer to the patient's chart for her  consent to telehealth for [facility].   The patient was identified using 2 identifiers.  Date:  07/26/2019   ID:  Candace Lewis, DOB [birth year], MRN [id number]  Patient Location: Home Provider Location: Office  PCP:  Scotty Court, DO  Cardiologist:  Kate Sable, MD  Electrophysiologist:  None   Evaluation Performed:  Follow-Up Visit  Chief Complaint:  Hypertension and palpitations  History of Present Illness:    KASELYNN Lewis is a 79 y.o. female with a history of palpitations and hypertension.  She appears to have an inappropriate sinus tachycardia.  Echocardiogram 07/07/2017 showed normal left ventricular systolic function and normal regional wall motion, LVEF 55 to 60%.  I started metoprolol 25 mg twice daily ata priorvisit.She developed dizziness so I reduced the dose to 12.5 mg twice daily at her visit on 08/26/2017.  She has a history of leukopenia and thrombocytopenia. She also has hypertriglyceridemia.  Due to complaints of fatigue at an office visit in January 2020, I ordered a stress test.  She did not pursue it.  She started vitamin D in January 2020.  She told me SBP's have been  running in 150 range over the last few days. She replaced the batteries in her monitor.  She denies chest pain. She has had back pain. This improves with belching. She denies leg swelling.  Her husband passed away on 25-Jan-2019. Since then, she has had a lot of stressors.  Labs on 11/16/18: BUN 25, creatinine 0.91.     Past Medical History:  Diagnosis Date  . Arthritis    lower back  . Constipation   . Diverticulitis   . Ectopic pregnancy 1977  . Esophageal cancer ([place]) 2015   tongue cancer, throat cancer  . GERD (gastroesophageal reflux disease)   . Headache    migraines  . Hypertension    no longer taking meds  . Hypokalemia 2015  . Hypothyroidism   . Lung nodule, multiple 01/08/2015  . Macular degeneration of right eye   . Malignant neoplasm of tongue ([place]) 01/24/2014  . Thyroid disease    Past Surgical History:  Procedure Laterality Date  . BIOPSY PHARYNX  2015  . BREAST BIOPSY Bilateral    benign  . CHOLECYSTECTOMY  2005  . ESOPHAGOGASTRODUODENOSCOPY  06/19/2014   Dr. Britta Mccreedy: LA Class B esophagitis. PEG tube in place. erythema in proximal esophagus.   . ESOPHAGOGASTRODUODENOSCOPY (EGD) WITH PROPOFOL N/A 03/11/2017   Web in upper third of esophagus s/p dilation with scope passage, small hiatal hernia, normal duodenum, no specimens collected  . FUDUCIAL PLACEMENT N/A 10/28/2015   Procedure: PLACEMENT OF FUDUCIAL TIMES THREE; LEFT UPPER LOBE BIOPSY;  Surgeon: Grace Isaac, MD;  Location: [facility];  Service: Thoracic;  Laterality:  N/A;  . MASTECTOMY, PARTIAL Left 1984   no cancer, fibrocystic breast disease  . SKIN SURGERY  1960   moles removed from various areas per patient report  . TONSILLECTOMY  1950   per patient report  . TUBAL LIGATION  1976  . VAGINAL HYSTERECTOMY  1981   vaginal hyst per patient report  . VIDEO BRONCHOSCOPY WITH ENDOBRONCHIAL NAVIGATION N/A 10/28/2015   Procedure: VIDEO BRONCHOSCOPY WITH ENDOBRONCHIAL NAVIGATION;  Surgeon: Grace Isaac, MD;  [facility];  Service: Thoracic;  Laterality: N/A;     Current Meds  Medication Sig  . docusate sodium (COLACE) 100 MG capsule Take 100 mg by mouth daily.  Marland Kitchen levothyroxine (SYNTHROID) 88 MCG tablet Take 1 tablet (88 mcg total) by mouth daily before breakfast.  . metoprolol tartrate (LOPRESSOR) 25 MG tablet Take 1 tablet by mouth twice daily (Patient taking differently: Pt states she takes 1/2 in am and 1/2 in pm)  . ondansetron (ZOFRAN-ODT) 8 MG disintegrating tablet Take 8 mg by mouth daily. Pt only takes once a day     Allergies:   Adhesive [tape] and Latex   Social History   Tobacco Use  . Smoking status: Former Smoker    Packs/day: 1.00    Years: 23.00    Pack years: 23.00    Types: Cigarettes    Quit date: 05/03/1982    Years since quitting: 37.2  . Smokeless tobacco: Never Used  Substance Use Topics  . Alcohol use: No    Alcohol/week: 0.0 standard drinks  . Drug use: No     Family Hx: The patient's family history is not on file.  ROS:   Please see the history of present illness.     All other systems reviewed and are negative.   Prior CV studies:   The following studies were reviewed today:  Reviewed above  Labs/Other Tests and Data Reviewed:    EKG:  No ECG reviewed.  Recent Labs: 11/16/2018: ALT 17; BUN 25; Creatinine, Ser 0.91; Hemoglobin 13.7; Platelets 117; Potassium 3.7; Sodium 138   Recent Lipid Panel No results found for: CHOL, TRIG, HDL, CHOLHDL, LDLCALC, LDLDIRECT  Wt Readings from Last 3 Encounters:  07/26/19 165 lb (74.8 kg)  12/19/18 168 lb (76.2 kg)  11/21/18 168 lb (76.2 kg)     Objective:    Vital Signs:  BP (!) 145/83   Pulse 72   Ht 5' 7.5" (1.715 m)   Wt 165 lb (74.8 kg)   BMI 25.46 kg/m    VITAL SIGNS:  reviewed  ASSESSMENT & PLAN:    1.  Palpitations: She has an inappropriate sinus tachycardia.  Symptomatically stable on Lopressor 12.5 mg twice daily.  Given her hypertension, I will switch Lopressor  to carvedilol 3.125 mg twice daily.  She developed dizziness with higher doses of Lopressor.  2.  Hypertension: Blood pressure is mildly elevated. SBP running in 150 range at home.  I will switch Lopressor to carvedilol 3.125 mg twice daily.  After she has been on this medication for at least 2 weeks, I asked her to start checking her blood pressure about 3 times per week.    COVID-19 Education: The signs and symptoms of COVID-19 were discussed with the patient and how to seek care for testing (follow up with PCP or arrange E-visit).  The importance of social distancing was discussed today.  Time:   Today, I have spent 20 minutes with the patient with telehealth technology discussing the above problems.  Medication Adjustments/Labs and Tests Ordered: Current medicines are reviewed at length with the patient today.  Concerns regarding medicines are outlined above.   Tests Ordered: No orders of the defined types were placed in this encounter.   Medication Changes: No orders of the defined types were placed in this encounter.   Follow Up:  Virtual Visit  3 months  Signed, Kate Sable, MD  07/26/2019 11:34 AM    [place]

## 2019-07-26 NOTE — Patient Instructions (Signed)
Medication Instructions:  STOP lopressor   START Coreg 3.125 mg twice a day   *If you need a refill on your cardiac medications before your next appointment, please call your pharmacy*   Lab Work: None toady If you have labs (blood work) drawn today and your tests are completely normal, you will receive your results only by: Marland Kitchen MyChart Message (if you have MyChart) OR . A paper copy in the mail If you have any lab test that is abnormal or we need to change your treatment, we will call you to review the results.   Testing/Procedures: None today   Follow-Up: At [facility], you and your health needs are our priority.  As part of our continuing mission to provide you with exceptional heart care, we have created designated Provider Care Teams.  These Care Teams include your primary Cardiologist (physician) and Advanced Practice Providers (APPs -  Physician Assistants and Nurse Practitioners) who all work together to provide you with the care you need, when you need it.  We recommend signing up for the patient portal called "MyChart".  Sign up information is provided on this After Visit Summary.  MyChart is used to connect with patients for Virtual Visits (Telemedicine).  Patients are able to view lab/test results, encounter notes, upcoming appointments, etc.  Non-urgent messages can be sent to your provider as well.   To learn more about what you can do with MyChart, go to [email or web address].    Your next appointment:   3 month(s)  The format for your next appointment:   Virtual Visit   Provider:   Kate Sable, MD   Other Instructions None        Thank you for choosing [facility] !

## 2019-07-26 NOTE — Addendum Note (Signed)
Addended by: Barbarann Ehlers A on: 07/26/2019 11:54 AM   Modules accepted: Orders

## 2019-10-29 NOTE — Progress Notes (Signed)
Cardiology Office Note  Date: 10/30/2019   ID: Lewis, Candace [birth year], MRN [id number]  PCP:  Adaline Sill, NP  Cardiologist:  Kate Sable, MD Electrophysiologist:  None   Chief Complaint: F/U HTN  History of Present Illness: Candace Lewis is a 79 y.o. female with a history of HTN, leukopenia, thrombocytopenia, hypertriglyceridemia, fatigue.  Last saw Dr. Bronson Ing 07/26/2019 via telemedicine visit.  She complained of fatigue at an office visit in January.  Stress test was ordered but patient did not pursue it.  She started vitamin D in January 2020.  Her blood pressures had been running in the 150 range over the previous 2 days.  She denied any chest pain.  She did have back pain which improved with belching.  She had several life stressors including death of her husband in 2019-01-29.  Her Lopressor was switched to carvedilol 3.125 mg p.o. twice daily.  She was asked to start checking her blood pressures at least 3 times per week.  She is here for 25-month follow-up.  She states she had to stop the carvedilol due to it making her feel dizzy.  States she restarted her metoprolol at half the dose.  Currently taking metoprolol 12.5 mg p.o. twice daily.  States she has been having some issues with her throat and swollen lymph glands secondary to radiation therapy.  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  States she currently is cancer free.  She can only take liquid nutrition in the form of protein drinks due to her narrow esophagus.  She denies any anginal or exertional symptoms, palpitations or arrhythmias, orthostatic symptoms (although she had 2 episodes in the recent past where she fell which were associated with dizziness) she has had no further episodes since that time.  Denies any.  PND, orthopnea, lower extremity edema.  Denies any current palpitations  Past Medical History:  Diagnosis Date  . Arthritis    lower back   . Constipation   . Diverticulitis   . Ectopic pregnancy 1977  . 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 GERD (gastroesophageal reflux disease)   . Headache    migraines  . Hypertension    no longer taking meds  . Hypokalemia 2015  . Hypothyroidism   . Lung nodule, multiple 01/08/2015  . Macular degeneration of right eye   . Malignant neoplasm of tongue ([place]) 01/24/2014  . Thyroid disease     Past Surgical History:  Procedure Laterality Date  . BIOPSY PHARYNX  2015  . BREAST BIOPSY Bilateral    benign  . CHOLECYSTECTOMY  2005  . ESOPHAGOGASTRODUODENOSCOPY  06/19/2014   Dr. Britta Mccreedy: LA Class B esophagitis. PEG tube in place. erythema in proximal esophagus.   . ESOPHAGOGASTRODUODENOSCOPY (EGD) WITH PROPOFOL N/A 03/11/2017   Web in upper third of esophagus s/p dilation with scope passage, small hiatal hernia, normal duodenum, no specimens collected  . FUDUCIAL PLACEMENT N/A 10/28/2015   Procedure: PLACEMENT OF FUDUCIAL TIMES THREE; LEFT UPPER LOBE BIOPSY;  Surgeon: Grace Isaac, MD;  Location: [facility];  Service: Thoracic;  Laterality: N/A;  . MASTECTOMY, PARTIAL Left 1984   no cancer, fibrocystic breast disease  . SKIN SURGERY  1960   moles removed from various areas per patient report  . TONSILLECTOMY  1950   per patient report  . TUBAL LIGATION  1976  . VAGINAL HYSTERECTOMY  1981   vaginal hyst per patient  report  . VIDEO BRONCHOSCOPY WITH ENDOBRONCHIAL NAVIGATION N/A 10/28/2015   Procedure: VIDEO BRONCHOSCOPY WITH ENDOBRONCHIAL NAVIGATION;  Surgeon: Grace Isaac, MD;  Location: [facility];  Service: Thoracic;  Laterality: N/A;    Current Outpatient Medications  Medication Sig Dispense Refill  . levothyroxine (EUTHYROX) 88 MCG tablet Take 88 mcg by mouth daily before breakfast.    . metoprolol tartrate (LOPRESSOR) 25 MG tablet Take 12.5 mg by mouth 2 (two) times daily.    . ondansetron (ZOFRAN-ODT) 8 MG disintegrating tablet Take 8 mg by mouth daily.  Pt only takes once a day     No current facility-administered medications for this visit.   Allergies:  Adhesive [tape] and Latex   Social History: The patient  reports that she quit smoking about 37 years ago. Her smoking use included cigarettes. She has a 23.00 pack-year smoking history. She has never used smokeless tobacco. She reports that she does not drink alcohol and does not use drugs.   Family History: The patient's family history is not on file.   ROS:  Please see the history of present illness. Otherwise, complete review of systems is positive for none.  All other systems are reviewed and negative.   Physical Exam: VS:  BP 132/76   Pulse 93   Ht 5' 7.5" (1.715 m)   Wt 169 lb (76.7 kg)   SpO2 95%   BMI 26.08 kg/m , BMI Body mass index is 26.08 kg/m.  Wt Readings from Last 3 Encounters:  10/30/19 169 lb (76.7 kg)  07/26/19 165 lb (74.8 kg)  12/19/18 168 lb (76.2 kg)    General: Patient appears comfortable at rest.Neck: Supple, no elevated JVP or carotid bruits, no thyromegaly. Lungs: Clear to auscultation, nonlabored breathing at rest. Cardiac: Regular rate and rhythm, no S3 or significant systolic murmur, no pericardial rub. Extremities: No pitting edema, distal pulses 2+. Skin: Warm and dry. Musculoskeletal: No kyphosis. Neuropsychiatric: Alert and oriented x3, affect grossly appropriate.  ECG:  An ECG dated 10/30/2019 was personally reviewed today and demonstrated:  Sinus rhythm rate of 93,   Recent Labwork: 11/16/2018: ALT 17; AST 22; BUN 25; Creatinine, Ser 0.91; Hemoglobin 13.7; Platelets 117; Potassium 3.7; Sodium 138  No results found for: CHOL, TRIG, HDL, CHOLHDL, VLDL, LDLCALC, LDLDIRECT  Other Studies Reviewed Today:  Echocardiogram 07/07/2017 Study Conclusions  - Left ventricle: The cavity size was normal. Wall thickness was  normal. Systolic function was normal. The estimated ejection  fraction was in the range of 55% to 60%. Wall motion was  normal;  there were no regional wall motion abnormalities. The study is  not technically sufficient to allow evaluation of LV diastolic  function.  - Aortic valve: Valve area (VTI): 2.29 cm^2. Valve area (Vmax):  2.23 cm^2. Valve area (Vmean): 1.97 cm^2.  - Systemic veins: IVC is small, suggesting low RA pressure and and  hypovolemia.  - Technically adequate study.   Assessment and Plan:  1. Palpitations   2. Essential hypertension    1. Palpitations Currently denies any palpitations or arrhythmias.  Continue metoprolol 12.5 mg p.o. twice daily.  2. Essential hypertension Blood pressure well controlled on current medications.  Continue metoprolol 12.5 mg daily.   Medication Adjustments/Labs and Tests Ordered: Current medicines are reviewed at length with the patient today.  Concerns regarding medicines are outlined above.    Disposition: Follow-up with Dr. Bronson Ing or APP 6 months  signed, Levell July, NP 10/30/2019 10:43 AM    [facility]  [facility], [place], [place] [address] Phone: [phone]; Fax: [phone]

## 2019-10-30 ENCOUNTER — Other Ambulatory Visit (HOSPITAL_COMMUNITY): Payer: Self-pay | Admitting: Internal Medicine

## 2019-10-30 ENCOUNTER — Telehealth: Payer: Medicare Other | Admitting: Cardiovascular Disease

## 2019-10-30 ENCOUNTER — Ambulatory Visit: Payer: Medicare Other | Admitting: Family Medicine

## 2019-10-30 ENCOUNTER — Encounter: Payer: Self-pay | Admitting: Family Medicine

## 2019-10-30 VITALS — BP 132/76 | HR 93 | Ht 67.5 in | Wt 169.0 lb

## 2019-10-30 DIAGNOSIS — I10 Essential (primary) hypertension: Secondary | ICD-10-CM

## 2019-10-30 DIAGNOSIS — Z1231 Encounter for screening mammogram for malignant neoplasm of breast: Secondary | ICD-10-CM

## 2019-10-30 DIAGNOSIS — R002 Palpitations: Secondary | ICD-10-CM | POA: Diagnosis not present

## 2019-10-30 NOTE — Patient Instructions (Addendum)

## 2019-11-15 ENCOUNTER — Inpatient Hospital Stay (HOSPITAL_COMMUNITY): Payer: Medicare Other | Attending: Hematology

## 2019-11-15 ENCOUNTER — Other Ambulatory Visit: Payer: Self-pay

## 2019-11-15 DIAGNOSIS — R519 Headache, unspecified: Secondary | ICD-10-CM | POA: Diagnosis not present

## 2019-11-15 DIAGNOSIS — Z8501 Personal history of malignant neoplasm of esophagus: Secondary | ICD-10-CM | POA: Insufficient documentation

## 2019-11-15 DIAGNOSIS — Z87891 Personal history of nicotine dependence: Secondary | ICD-10-CM | POA: Insufficient documentation

## 2019-11-15 DIAGNOSIS — D61818 Other pancytopenia: Secondary | ICD-10-CM | POA: Insufficient documentation

## 2019-11-15 DIAGNOSIS — Z888 Allergy status to other drugs, medicaments and biological substances status: Secondary | ICD-10-CM | POA: Diagnosis not present

## 2019-11-15 DIAGNOSIS — Z79899 Other long term (current) drug therapy: Secondary | ICD-10-CM | POA: Insufficient documentation

## 2019-11-15 DIAGNOSIS — C029 Malignant neoplasm of tongue, unspecified: Secondary | ICD-10-CM | POA: Diagnosis not present

## 2019-11-15 DIAGNOSIS — R42 Dizziness and giddiness: Secondary | ICD-10-CM | POA: Diagnosis not present

## 2019-11-15 DIAGNOSIS — K5900 Constipation, unspecified: Secondary | ICD-10-CM | POA: Diagnosis not present

## 2019-11-15 DIAGNOSIS — Z931 Gastrostomy status: Secondary | ICD-10-CM | POA: Insufficient documentation

## 2019-11-15 DIAGNOSIS — Z8759 Personal history of other complications of pregnancy, childbirth and the puerperium: Secondary | ICD-10-CM | POA: Insufficient documentation

## 2019-11-15 LAB — CBC WITH DIFFERENTIAL/PLATELET
Abs Immature Granulocytes: 0 10*3/uL (ref 0.00–0.07)
Basophils Absolute: 0 10*3/uL (ref 0.0–0.1)
Basophils Relative: 1 %
Eosinophils Absolute: 0.1 10*3/uL (ref 0.0–0.5)
Eosinophils Relative: 3 %
HCT: 40.2 % (ref 36.0–46.0)
Hemoglobin: 13.4 g/dL (ref 12.0–15.0)
Immature Granulocytes: 0 %
Lymphocytes Relative: 26 %
Lymphs Abs: 0.8 10*3/uL (ref 0.7–4.0)
MCH: 28.8 pg (ref 26.0–34.0)
MCHC: 33.3 g/dL (ref 30.0–36.0)
MCV: 86.5 fL (ref 80.0–100.0)
Monocytes Absolute: 0.3 10*3/uL (ref 0.1–1.0)
Monocytes Relative: 10 %
Neutro Abs: 1.8 10*3/uL (ref 1.7–7.7)
Neutrophils Relative %: 60 %
Platelets: 161 10*3/uL (ref 150–400)
RBC: 4.65 MIL/uL (ref 3.87–5.11)
RDW: 13 % (ref 11.5–15.5)
WBC: 2.9 10*3/uL — ABNORMAL LOW (ref 4.0–10.5)
nRBC: 0 % (ref 0.0–0.2)

## 2019-11-15 LAB — COMPREHENSIVE METABOLIC PANEL
ALT: 20 U/L (ref 0–44)
AST: 26 U/L (ref 15–41)
Albumin: 3.9 g/dL (ref 3.5–5.0)
Alkaline Phosphatase: 63 U/L (ref 38–126)
Anion gap: 9 (ref 5–15)
BUN: 24 mg/dL — ABNORMAL HIGH (ref 8–23)
CO2: 26 mmol/L (ref 22–32)
Calcium: 9.3 mg/dL (ref 8.9–10.3)
Chloride: 102 mmol/L (ref 98–111)
Creatinine, Ser: 0.93 mg/dL (ref 0.44–1.00)
GFR calc Af Amer: >60 mL/min (ref >60)
GFR calc non Af Amer: 59 mL/min — ABNORMAL LOW (ref >60)
Glucose, Bld: 106 mg/dL — ABNORMAL HIGH (ref 70–99)
Potassium: 3.6 mmol/L (ref 3.5–5.1)
Sodium: 137 mmol/L (ref 135–145)
Total Bilirubin: 0.6 mg/dL (ref 0.3–1.2)
Total Protein: 6.8 g/dL (ref 6.5–8.1)

## 2019-11-15 LAB — TSH: TSH: 4.217 u[IU]/mL (ref 0.350–4.500)

## 2019-11-22 ENCOUNTER — Ambulatory Visit (HOSPITAL_COMMUNITY)
Admission: RE | Admit: 2019-11-22 | Discharge: 2019-11-22 | Disposition: A | Discharge status: Home / Self Care | Payer: Medicare Other | Source: Ambulatory Visit | Attending: Internal Medicine | Admitting: Internal Medicine

## 2019-11-22 ENCOUNTER — Inpatient Hospital Stay (HOSPITAL_COMMUNITY): Payer: Medicare Other | Admitting: Nurse Practitioner

## 2019-11-22 ENCOUNTER — Other Ambulatory Visit: Payer: Self-pay

## 2019-11-22 DIAGNOSIS — C029 Malignant neoplasm of tongue, unspecified: Secondary | ICD-10-CM | POA: Diagnosis not present

## 2019-11-22 DIAGNOSIS — Z1231 Encounter for screening mammogram for malignant neoplasm of breast: Secondary | ICD-10-CM | POA: Diagnosis not present

## 2019-11-22 NOTE — Progress Notes (Signed)
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   01/19/2014 PET scan   Tongue mass with 2 level II Right neck nodes T2N2b, Stage IVA   02/08/2014 - 04/03/2014 Radiation Therapy   Initiation of radiation therapy with 70 Gy delivered to the right base of tongue/tonsil 63 Gy to high risk nodal echelons, 56 Gy to the intermediate r base of tongue/tonsil/bilateral neck nodes   02/08/2014 - 03/15/2014 Chemotherapy   weekly cisplatin, only 4 doses given, severe toxicity with neutropenia, dehydration, nausea, vomiting, hospitalization and obstipation   10/24/2015 PET scan   low level non malig range hyper metab in L apical sub solid pulm nodule, suspicious for low grade adeno. diffuse tongue hypermetab without CT correlation   10/28/2015 Procedure   Bronchoscopy navigation, electromagnetic navigation, bronchoscopy with biopsy of LUL lung lesion, placement of fiducial markers X 3   10/28/2015 Pathology Results   Scant benign lung tissue, no tumor seen      INTERVAL HISTORY:  Candace Lewis 79 y.o. female returns for routine follow-up for cancer of the tongue.  She is recently been to visit Dr. Benjamine Mola he scoped her and told her there is no signs of active cancer.  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  She was unable to turn her neck and had very limited movement until starting physical therapy.  This has improved since then.  She does report she is still unable to eat anything solid.  She also has problems swallowing pills. Denies any nausea, vomiting, or diarrhea. Denies any new  pains. Had not noticed any recent bleeding such as epistaxis, hematuria or hematochezia. Denies recent chest pain on exertion, shortness of breath on minimal exertion, pre-syncopal episodes, or palpitations. Denies any numbness or tingling in hands or feet. Denies any recent fevers, infections, or recent hospitalizations. Patient reports appetite at 50% and energy level at 75%.  She is maintain her weight at this time.     REVIEW OF SYSTEMS:  Review of Systems  Gastrointestinal: Positive for constipation.  Neurological: Positive for dizziness and headaches.  All other systems reviewed and are negative.    PAST MEDICAL/SURGICAL HISTORY:  Past Medical History:  Diagnosis Date  . Arthritis    lower back  . Constipation   . Diverticulitis   . Ectopic pregnancy 1977  . 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 GERD (gastroesophageal reflux disease)   . Headache    migraines  . Hypertension    no longer taking meds  . Hypokalemia 2015  . Hypothyroidism   . Lung nodule, multiple 01/08/2015  . Macular degeneration of right eye   . Malignant neoplasm of tongue ([facility]) 01/24/2014  . Thyroid disease    Past Surgical History:  Procedure Laterality Date  . BIOPSY PHARYNX  2015  . BREAST BIOPSY Bilateral    benign  . CHOLECYSTECTOMY  2005  . ESOPHAGOGASTRODUODENOSCOPY  06/19/2014   Dr. Britta Mccreedy: LA Class B esophagitis. PEG tube in place. erythema in proximal esophagus.   . ESOPHAGOGASTRODUODENOSCOPY (EGD) WITH PROPOFOL N/A 03/11/2017  Web in upper third of esophagus s/p dilation with scope passage, small hiatal hernia, normal duodenum, no specimens collected  . FUDUCIAL PLACEMENT N/A 10/28/2015   Procedure: PLACEMENT OF FUDUCIAL TIMES THREE; LEFT UPPER LOBE BIOPSY;  Surgeon: Grace Isaac, MD;  Location: [facility];  Service: Thoracic;  Laterality: N/A;  . MASTECTOMY, PARTIAL Left 1984   no cancer, fibrocystic breast disease  . SKIN SURGERY  1960   moles removed from  various areas per patient report  . TONSILLECTOMY  1950   per patient report  . TUBAL LIGATION  1976  . VAGINAL HYSTERECTOMY  1981   vaginal hyst per patient report  . VIDEO BRONCHOSCOPY WITH ENDOBRONCHIAL NAVIGATION N/A 10/28/2015   Procedure: VIDEO BRONCHOSCOPY WITH ENDOBRONCHIAL NAVIGATION;  Surgeon: Grace Isaac, MD;  Location: [facility];  Service: Thoracic;  Laterality: N/A;     SOCIAL HISTORY:  Social History   Socioeconomic History  . Marital status: Married    Spouse name: Not on file  . Number of children: Not on file  . Years of education: Not on file  . Highest education level: Not on file  Occupational History  . Not on file  Tobacco Use  . Smoking status: Former Smoker    Packs/day: 1.00    Years: 23.00    Pack years: 23.00    Types: Cigarettes    Quit date: 05/03/1982    Years since quitting: 37.5  . Smokeless tobacco: Never Used  Vaping Use  . Vaping Use: Never used  Substance and Sexual Activity  . Alcohol use: No    Alcohol/week: 0.0 standard drinks  . Drug use: No  . Sexual activity: Never    Birth control/protection: None  Other Topics Concern  . Not on file  Social History Narrative  . Not on file   Social Determinants of Health   Financial Resource Strain:   . Difficulty of Paying Living Expenses:   Food Insecurity:   . Worried About Charity fundraiser in the Last Year:   . Arboriculturist in the Last Year:   Transportation Needs:   . Film/video editor (Medical):   Marland Kitchen Lack of Transportation (Non-Medical):   Physical Activity:   . Days of Exercise per Week:   . Minutes of Exercise per Session:   Stress:   . Feeling of Stress :   Social Connections:   . Frequency of Communication with Friends and Family:   . Frequency of Social Gatherings with Friends and Family:   . Attends Religious Services:   . Active Member of Clubs or Organizations:   . Attends Archivist Meetings:   Marland Kitchen Marital Status:   Intimate Partner  Violence:   . Fear of Current or Ex-Partner:   . Emotionally Abused:   Marland Kitchen Physically Abused:   . Sexually Abused:     FAMILY HISTORY:  No family history on file.  CURRENT MEDICATIONS:  Outpatient Encounter Medications as of 11/22/2019  Medication Sig  . levothyroxine (EUTHYROX) 88 MCG tablet Take 88 mcg by mouth daily before breakfast.  . metoprolol tartrate (LOPRESSOR) 25 MG tablet Take 12.5 mg by mouth 2 (two) times daily.  . ondansetron (ZOFRAN-ODT) 8 MG disintegrating tablet Take 8 mg by mouth daily. Pt only takes once a day   No facility-administered encounter medications on file as of 11/22/2019.    ALLERGIES:  Allergies  Allergen Reactions  . Adhesive [Tape] Rash    Reports rash and blistering if any  adhesive tape  . Latex Rash    Rash and skin blistering per patient report     PHYSICAL EXAM:  ECOG Performance status: 1  Vitals:   11/22/19 1206  BP: 133/78  Pulse: 67  Resp: 18  Temp: (!) 96.8 F (36 C)  SpO2: 99%   Filed Weights   11/22/19 1206  Weight: 167 lb 12.3 oz (76.1 kg)   Physical Exam Constitutional:      Appearance: Normal appearance. She is normal weight.  Cardiovascular:     Rate and Rhythm: Normal rate and regular rhythm.     Heart sounds: Normal heart sounds.  Pulmonary:     Effort: Pulmonary effort is normal.     Breath sounds: Normal breath sounds.  Abdominal:     General: Bowel sounds are normal.     Palpations: Abdomen is soft.  Musculoskeletal:        General: Normal range of motion.  Skin:    General: Skin is warm.  Neurological:     Mental Status: She is alert and oriented to person, place, and time. Mental status is at baseline.  Psychiatric:        Mood and Affect: Mood normal.        Behavior: Behavior normal.        Thought Content: Thought content normal.        Judgment: Judgment normal.      LABORATORY DATA:  I have reviewed the labs as listed.  CBC    Component Value Date/Time   WBC 2.9 (L) 11/15/2019 1040    RBC 4.65 11/15/2019 1040   HGB 13.4 11/15/2019 1040   HCT 40.2 11/15/2019 1040   PLT 161 11/15/2019 1040   MCV 86.5 11/15/2019 1040   MCH 28.8 11/15/2019 1040   MCHC 33.3 11/15/2019 1040   RDW 13.0 11/15/2019 1040   LYMPHSABS 0.8 11/15/2019 1040   MONOABS 0.3 11/15/2019 1040   EOSABS 0.1 11/15/2019 1040   BASOSABS 0.0 11/15/2019 1040   CMP Latest Ref Rng & Units 11/15/2019 11/16/2018 04/18/2018  Glucose 70 - 99 mg/dL 106(H) 126(H) 108(H)  BUN 8 - 23 mg/dL 24(H) 25(H) 25(H)  Creatinine 0.44 - 1.00 mg/dL 0.93 0.91 0.82  Sodium 135 - 145 mmol/L 137 138 138  Potassium 3.5 - 5.1 mmol/L 3.6 3.7 3.9  Chloride 98 - 111 mmol/L 102 104 105  CO2 22 - 32 mmol/L 26 28 26   Calcium 8.9 - 10.3 mg/dL 9.3 9.5 9.2  Total Protein 6.5 - 8.1 g/dL 6.8 6.7 6.5  Total Bilirubin 0.3 - 1.2 mg/dL 0.6 0.5 0.7  Alkaline Phos 38 - 126 U/L 63 49 46  AST 15 - 41 U/L 26 22 21   ALT 0 - 44 U/L 20 17 14     All questions were answered to patient's stated satisfaction. Encouraged patient to call with any new concerns or questions before his next visit to the cancer center and we can certain see him sooner, if needed.     ASSESSMENT & PLAN:  Malignant neoplasm of tongue ([facility]) 1.  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 -CT soft tissue of neck on 11/16/2018 shows chronic scarring in the right tonsillar region with no evidence of progression findings.  No enlarged lymph nodes. -Physical examination did not reveal any oropharyngeal masses.  There is a white coating on the tongue which is stable.  She scraped her tongue once daily.  Her neck  has signs of fibrosis. -She is currently under physical therapy for her neck fibrosis and movement issues. -Labs done on 11/15/2019 showed WBC 2.9.  All other labs were WNL. -We will see her back in 1 year with repeat labs including TSH.  2.  Pancytopenia: -She has leukopenia and thrombocytopenia since  she finished chemo radiation therapy. -She does not have any recurrent infections or bleeding issues. -Bone marrow biopsy on 03/30/2018 showed normocellular marrow with erythroid hyperplasia and minimal dyspoiesis. -Labs done on 11/15/2019 showed WBC 2.9 and platelets 161  3.  Nutrition: -She is not able to eat any solid food since she completed chemoradiation therapy in December 2015. -She is drinking 8 ounces of Ensure with 8 ounces of milk 3 times a day. -Her weight has been stable with this.  4.  Right sided headache: -She reports she has had a right-sided headache for 4 days now.  She report she has recently been seen by ENT doctor and he reports no signs of cancer. -She has taken Goody powders and other pain relievers with no relief. -She denies any vision problems. -We will refer her to neurology.      Orders placed this encounter:  Orders Placed This Encounter  Procedures  . Lupus anticoagulant panel  . CBC with Differential/Platelet  . Comprehensive metabolic panel  . TSH  . Vitamin B12  . VITAMIN D 25 Hydroxy (Vit-D Deficiency, Fractures)  . Folate      Francene Finders, FNP-C [facility] [phone]

## 2019-11-22 NOTE — Assessment & Plan Note (Addendum)
1.  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 -CT soft tissue of neck on 11/16/2018 shows chronic scarring in the right tonsillar region with no evidence of progression findings.  No enlarged lymph nodes. -Physical examination did not reveal any oropharyngeal masses.  There is a white coating on the tongue which is stable.  She scraped her tongue once daily.  Her neck has signs of fibrosis. -She is currently under physical therapy for her neck fibrosis and movement issues. -Labs done on 11/15/2019 showed WBC 2.9.  All other labs were WNL. -We will see her back in 1 year with repeat labs including TSH.  2.  Pancytopenia: -She has leukopenia and thrombocytopenia since she finished chemo radiation therapy. -She does not have any recurrent infections or bleeding issues. -Bone marrow biopsy on 03/30/2018 showed normocellular marrow with erythroid hyperplasia and minimal dyspoiesis. -Labs done on 11/15/2019 showed WBC 2.9 and platelets 161  3.  Nutrition: -She is not able to eat any solid food since she completed chemoradiation therapy in December 2015. -She is drinking 8 ounces of Ensure with 8 ounces of milk 3 times a day. -Her weight has been stable with this.  4.  Right sided headache: -She reports she has had a right-sided headache for 4 days now.  She report she has recently been seen by ENT doctor and he reports no signs of cancer. -She has taken Goody powders and other pain relievers with no relief. -She denies any vision problems. -We will refer her to neurology.

## 2019-12-21 ENCOUNTER — Ambulatory Visit: Payer: Medicare Other | Admitting: Cardiovascular Disease

## 2020-03-11 ENCOUNTER — Telehealth: Payer: Self-pay | Admitting: Family Medicine

## 2020-03-11 NOTE — Telephone Encounter (Signed)
Pt called stating that her heart is beating really hard and is a little out of breath. When she took her BP it was 132/75 and pulse was 73  Please call [phone]

## 2020-03-11 NOTE — Telephone Encounter (Signed)
Pt voiced understanding and will contact us back if symptoms continue

## 2020-03-11 NOTE — Telephone Encounter (Signed)
Tell her if she has another episode of palpitations or fast pounding heartbeat to come to the office and get an EKG.  If it persists, we will put her on a monitor

## 2020-03-11 NOTE — Telephone Encounter (Signed)
Pt says for the last week or so feels like her heart is pounding and SOB every other day lasting most of the day - denies dizziness/chest pain/swelling - HR remains in the 70s when this happens - denies any medication changes and is taking Lopressor 12.5 mg bid

## 2020-06-04 ENCOUNTER — Ambulatory Visit: Payer: Medicare Other | Admitting: Orthopaedic Surgery

## 2020-06-04 ENCOUNTER — Ambulatory Visit: Payer: Medicare Other

## 2020-06-04 ENCOUNTER — Other Ambulatory Visit: Payer: Self-pay

## 2020-06-04 ENCOUNTER — Encounter: Payer: Self-pay | Admitting: Orthopaedic Surgery

## 2020-06-04 VITALS — BP 170/98 | HR 80 | Ht 67.5 in | Wt 165.0 lb

## 2020-06-04 DIAGNOSIS — M79642 Pain in left hand: Secondary | ICD-10-CM

## 2020-06-04 NOTE — Progress Notes (Signed)
Subjective:    Patient ID: Candace Lewis, female    DOB: [birth year], 80 y.o.   MRN: [id number]  HPI She has had pain and swelling of the left hand over the third metacarpal area dorsally.  It has been hurting for six weeks or so.  She has no trauma.  She has some swelling but no redness. She has tried tylenol but no help.  She is post throat cancer and radiation.  She can only take liquids and was told to avoid NSAIDs.  She is now five years post treatment.  She is on a total liquid diet.   Review of Systems  Constitutional: Positive for activity change.  HENT:       Post throat cancer, on total liquid diet.  Cannot take pills or capsules.  Musculoskeletal: Positive for arthralgias and joint swelling.  All other systems reviewed and are negative.  For Review of Systems, all other systems reviewed and are negative.  The following is a summary of the past history medically, past history surgically, known current medicines, social history and family history.  This information is gathered electronically by the computer from prior information and documentation.  I review this each visit and have found including this information at this point in the chart is beneficial and informative.   Past Medical History:  Diagnosis Date  . Arthritis    lower back  . Constipation   . Diverticulitis   . Ectopic pregnancy 1977  . 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 GERD (gastroesophageal reflux disease)   . Headache    migraines  . Hypertension    no longer taking meds  . Hypokalemia 2015  . Hypothyroidism   . Lung nodule, multiple 01/08/2015  . Macular degeneration of right eye   . 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 Thyroid disease     Past Surgical History:  Procedure Laterality Date  . BIOPSY PHARYNX  2015  . BREAST BIOPSY Bilateral    benign  . CHOLECYSTECTOMY  2005  . ESOPHAGOGASTRODUODENOSCOPY  06/19/2014   Dr. Teena Dunk: [place] Class B  esophagitis. PEG tube in place. erythema in proximal esophagus.   . ESOPHAGOGASTRODUODENOSCOPY (EGD) WITH PROPOFOL N/A 03/11/2017   Web in upper third of esophagus s/p dilation with scope passage, small hiatal hernia, normal duodenum, no specimens collected  . FUDUCIAL PLACEMENT N/A 10/28/2015   Procedure: PLACEMENT OF FUDUCIAL TIMES THREE; LEFT UPPER LOBE BIOPSY;  Surgeon: Delight Ovens, MD;  Location: [facility];  Service: Thoracic;  Laterality: N/A;  . MASTECTOMY, PARTIAL Left 1984   no cancer, fibrocystic breast disease  . SKIN SURGERY  1960   moles removed from various areas per patient report  . TONSILLECTOMY  1950   per patient report  . TUBAL LIGATION  1976  . VAGINAL HYSTERECTOMY  1981   vaginal hyst per patient report  . VIDEO BRONCHOSCOPY WITH ENDOBRONCHIAL NAVIGATION N/A 10/28/2015   Procedure: VIDEO BRONCHOSCOPY WITH ENDOBRONCHIAL NAVIGATION;  Surgeon: Delight Ovens, MD;  Location: [facility];  Service: Thoracic;  Laterality: N/A;    Current Outpatient Medications on File Prior to Visit  Medication Sig Dispense Refill  . levothyroxine (SYNTHROID) 88 MCG tablet Take 88 mcg by mouth daily before breakfast.    . metoprolol tartrate (LOPRESSOR) 25 MG tablet Take 12.5 mg by mouth 2 (two) times daily.    . rosuvastatin (CRESTOR) 20 MG tablet Take 20 mg by mouth at bedtime.  No current facility-administered medications on file prior to visit.    Social History   Socioeconomic History  . Marital status: Married    Spouse name: Not on file  . Number of children: Not on file  . Years of education: Not on file  . Highest education level: Not on file  Occupational History  . Not on file  Tobacco Use  . Smoking status: Former Smoker    Packs/day: 1.00    Years: 23.00    Pack years: 23.00    Types: Cigarettes    Quit date: 05/03/1982    Years since quitting: 38.1  . Smokeless tobacco: Never Used  Vaping Use  . Vaping Use: Never used  Substance and Sexual Activity  .  Alcohol use: No    Alcohol/week: 0.0 standard drinks  . Drug use: No  . Sexual activity: Never    Birth control/protection: None  Other Topics Concern  . Not on file  Social History Narrative  . Not on file   Social Determinants of Health   Financial Resource Strain: Not on file  Food Insecurity: Not on file  Transportation Needs: Not on file  Physical Activity: Not on file  Stress: Not on file  Social Connections: Not on file  Intimate Partner Violence: Not on file    History reviewed. No pertinent family history.  BP (!) 170/98   Pulse 80   Ht 5' 7.5" (1.715 m)   Wt 165 lb (74.8 kg)   BMI 25.46 kg/m   Body mass index is 25.46 kg/m.      Objective:   Physical Exam Vitals and nursing note reviewed. Exam conducted with a chaperone present.  Constitutional:      Appearance: She is well-developed and well-nourished.  HENT:     Head: Normocephalic and atraumatic.  Eyes:     Extraocular Movements: EOM normal.     Conjunctiva/sclera: Conjunctivae normal.     Pupils: Pupils are equal, round, and reactive to light.  Cardiovascular:     Rate and Rhythm: Normal rate and regular rhythm.     Pulses: Intact distal pulses.  Pulmonary:     Effort: Pulmonary effort is normal.  Abdominal:     Palpations: Abdomen is soft.  Musculoskeletal:       Hands:     Cervical back: Normal range of motion and neck supple.  Skin:    General: Skin is warm and dry.  Neurological:     Mental Status: She is alert and oriented to person, place, and time.     Cranial Nerves: No cranial nerve deficit.     Motor: No abnormal muscle tone.     Coordination: Coordination normal.     Deep Tendon Reflexes: Reflexes are normal and symmetric. Reflexes normal.  Psychiatric:        Mood and Affect: Mood and affect normal.        Behavior: Behavior normal.        Thought Content: Thought content normal.        Judgment: Judgment normal.   X-rays were done of the left hand, reported  separately.        Assessment & Plan:   Encounter Diagnosis  Name Primary?  . Pain in left hand Yes   Procedure note: After permission from the patient, the dorsum of the left long finger at the MTP joint was prepped.  I injected 1 % Xylocaine and 1 cc DepoMedrol by sterile technique tolerated well.  Return in two  weeks.  Call if any problem.  Precautions discussed.   Electronically Signed Sanjuana Kava, MD 2/1/20228:49 AM

## 2020-06-18 ENCOUNTER — Ambulatory Visit: Payer: Medicare Other | Admitting: Orthopaedic Surgery

## 2020-06-20 ENCOUNTER — Encounter: Payer: Self-pay | Admitting: Orthopaedic Surgery

## 2020-06-20 ENCOUNTER — Other Ambulatory Visit: Payer: Self-pay

## 2020-06-20 ENCOUNTER — Ambulatory Visit (INDEPENDENT_AMBULATORY_CARE_PROVIDER_SITE_OTHER): Payer: Medicare Other | Admitting: Orthopaedic Surgery

## 2020-06-20 VITALS — BP 154/94 | HR 94 | Ht 67.5 in | Wt 168.0 lb

## 2020-06-20 DIAGNOSIS — M79642 Pain in left hand: Secondary | ICD-10-CM | POA: Diagnosis not present

## 2020-06-20 NOTE — Progress Notes (Signed)
I am much better  Her left hand third metacarpal area is not hurting today.  The injection really helped.  She has no pain, no redness, full ROM.  Encounter Diagnosis  Name Primary?  . Pain in left hand Yes   I will see her as needed.  Call if any problem.  Precautions discussed.   Electronically Signed Sanjuana Kava, MD 2/17/202210:12 AM

## 2020-09-03 ENCOUNTER — Encounter: Payer: Self-pay | Admitting: Orthopaedic Surgery

## 2020-09-03 ENCOUNTER — Other Ambulatory Visit: Payer: Self-pay

## 2020-09-03 ENCOUNTER — Ambulatory Visit: Payer: Medicare Other | Admitting: Orthopaedic Surgery

## 2020-09-03 VITALS — BP 120/77 | HR 85 | Ht 67.5 in | Wt 174.0 lb

## 2020-09-03 DIAGNOSIS — M79642 Pain in left hand: Secondary | ICD-10-CM | POA: Diagnosis not present

## 2020-09-03 NOTE — Progress Notes (Signed)
The pain of the ulnar side of the third metacarpal distally has recurred.  She has some swelling.  The injection in February helped and she would like a new injection today.  She used a weed eater and the pain returned.  She has no numbness.  Procedure note: After permission from the patient and prep of the left hand dorsally over the ulnar side of the third metacarpal distally, the area was injected with 1% plain Xylocaine and 1 cc of Celestone 6 mgm by sterile technique tolerated well.  I will see her as needed.  She may need to see hand surgeon.  Call if any problem.  Precautions discussed.   Electronically Signed Sanjuana Kava, MD 5/3/20223:13 PM

## 2020-10-09 ENCOUNTER — Other Ambulatory Visit (HOSPITAL_COMMUNITY): Payer: Self-pay | Admitting: Internal Medicine

## 2020-10-09 DIAGNOSIS — Z1231 Encounter for screening mammogram for malignant neoplasm of breast: Secondary | ICD-10-CM

## 2020-11-05 NOTE — Progress Notes (Signed)
Cardiology Office Note  Date: 11/06/2020   ID: Candace Lewis, Candace Lewis 1940/08/13, MRN [id number]  PCP:  Adaline Sill, NP  Cardiologist:  Kate Sable, MD (Inactive) Electrophysiologist:  None   Chief Complaint: F/U HTN  History of Present Illness: Candace Lewis is a 80 y.o. female with a history of HTN, leukopenia, thrombocytopenia, hypertriglyceridemia, fatigue.  Last saw Dr. Bronson Ing 07/26/2019 via telemedicine visit.  She complained of fatigue at an office visit in January.  Stress test was ordered but patient did not pursue it.  She started vitamin D in January 2020.  Her blood pressures had been running in the 150 range over the previous 2 days.  She denied any chest pain.  She did have back pain which improved with belching.  She had several life stressors including death of her husband in 2019/02/17.  Her Lopressor was switched to carvedilol 3.125 mg p.o. twice daily.  She was asked to start checking her blood pressures at least 3 times per week.  At last follow-up she stated she had to stop the carvedilol due to it making her feel dizzy.  She had restarted her metoprolol at half the dose.  She was taking metoprolol 12.5 mg p.o. twice daily.  She was having some issues with her throat and swollen lymph glands secondary to radiation therapy for esophageal cancer.  She was having issues with swallowing secondary to scar tissue.  She stated she was currently cancer free.  She could only take liquid nutrition in the form of protein drinks due to her narrow esophagus.  She denied any anginal or exertional symptoms, palpitations or arrhythmias, orthostatic symptoms (although she had 2 episodes in the recent past where she fell which were associated with dizziness).  She had no further episodes since that time.  She denied PND, orthopnea, lower extremity edema.  Denied any palpitations.  She is here for follow-up today states she is feeling well when doing several  labor-intensive projects at home without any difficulty.  She states she states busy all the time.  She states she is recently seen her ENT physician, gastroenterologist.  She follows with Dr. Delton Coombes for history of esophageal cancer.  Currently denies any anginal or exertional symptoms, palpitations or arrhythmias, orthostatic symptoms, CVA or TIA-like symptoms, PND, orthopnea, bleeding.  Denies any claudication-like symptoms, DVT or PE-like symptoms.  EKG today shows normal sinus rhythm with a rate of 93.  Blood pressure is well controlled today at 130/70.  She continues metoprolol 12.5 mg p.o. twice daily for history of palpitations.  She is continuing Crestor.  Continuing Synthroid for hypothyroidism.  Past Medical History:  Diagnosis Date   Arthritis    lower back   Constipation    Diverticulitis    Ectopic pregnancy 1977   Esophageal cancer ([place]) 2015   tongue cancer, throat cancer   GERD (gastroesophageal reflux disease)    Headache    migraines   Hypertension    no longer taking meds   Hypokalemia 2015   Hypothyroidism    Lung nodule, multiple 01/08/2015   Macular degeneration of right eye    Malignant neoplasm of tongue ([place]) 01/24/2014   Thyroid disease     Past Surgical History:  Procedure Laterality Date   BIOPSY PHARYNX  2015   BREAST BIOPSY Bilateral    benign   CHOLECYSTECTOMY  2005   ESOPHAGOGASTRODUODENOSCOPY  06/19/2014   Dr. Britta Mccreedy: LA Class B esophagitis. PEG tube in place. erythema in proximal esophagus.  ESOPHAGOGASTRODUODENOSCOPY (EGD) WITH PROPOFOL N/A 03/11/2017   Web in upper third of esophagus s/p dilation with scope passage, small hiatal hernia, normal duodenum, no specimens collected   FUDUCIAL PLACEMENT N/A 10/28/2015   Procedure: PLACEMENT OF FUDUCIAL TIMES THREE; LEFT UPPER LOBE BIOPSY;  Surgeon: Grace Isaac, MD;  Location: [facility];  Service: Thoracic;  Laterality: N/A;   MASTECTOMY, PARTIAL Left 1984   no cancer, fibrocystic breast disease    SKIN SURGERY  1960   moles removed from various areas per patient report   TONSILLECTOMY  1950   per patient report   [place]   vaginal hyst per patient report   [place] N/A 10/28/2015   Procedure: [place];  Surgeon: Grace Isaac, MD;  Location: [facility];  Service: Thoracic;  Laterality: N/A;    Current Outpatient Medications  Medication Sig Dispense Refill   levothyroxine (SYNTHROID) 88 MCG tablet Take 88 mcg by mouth daily before breakfast.     metoprolol tartrate (LOPRESSOR) 25 MG tablet Take 12.5 mg by mouth 2 (two) times daily.     omeprazole (PRILOSEC) 20 MG capsule Take 20 mg by mouth daily.     rosuvastatin (CRESTOR) 20 MG tablet Take 20 mg by mouth at bedtime.     No current facility-administered medications for this visit.   Allergies:  Adhesive [tape] and Latex   Social History: The patient  reports that she quit smoking about 38 years ago. Her smoking use included cigarettes. She has a 23.00 pack-year smoking history. She has never used smokeless tobacco. She reports that she does not drink alcohol and does not use drugs.   Family History: The patient's family history is not on file.   ROS:  Please see the history of present illness. Otherwise, complete review of systems is positive for none.  All other systems are reviewed and negative.   Physical Exam: VS:  BP 130/70   Pulse 92   Ht 5' 7.5" (1.715 m)   Wt 177 lb 8 oz (80.5 kg)   SpO2 98%   BMI 27.39 kg/m , BMI Body mass index is 27.39 kg/m.  Wt Readings from Last 3 Encounters:  11/06/20 177 lb 8 oz (80.5 kg)  09/03/20 174 lb (78.9 kg)  06/20/20 168 lb (76.2 kg)    General: Patient appears comfortable at rest. Neck: Supple, no elevated JVP or carotid bruits, no thyromegaly. Lungs: Clear to auscultation, nonlabored breathing at rest. Cardiac: Regular rate and rhythm, no S3 or  significant systolic murmur, no pericardial rub. Extremities: No pitting edema, distal pulses 2+. Skin: Warm and dry. Musculoskeletal: No kyphosis. Neuropsychiatric: Alert and oriented x3, affect grossly appropriate.  ECG:  An ECG dated 10/30/2019 was personally reviewed today and demonstrated:  Sinus rhythm rate of 93,   Recent Labwork: 11/15/2019: ALT 20; AST 26; BUN 24; Creatinine, Ser 0.93; Hemoglobin 13.4; Platelets 161; Potassium 3.6; Sodium 137; TSH 4.217  No results found for: CHOL, TRIG, HDL, CHOLHDL, VLDL, LDLCALC, LDLDIRECT  Other Studies Reviewed Today:  Echocardiogram 07/07/2017 Study Conclusions  - Left ventricle: The cavity size was normal. Wall thickness was    normal. Systolic function was normal. The estimated ejection    fraction was in the range of 55% to 60%. Wall motion was normal;    there were no regional wall motion abnormalities. The study is    not technically sufficient to allow evaluation of LV diastolic  function.  - Aortic valve: Valve area (VTI): 2.29 cm^2. Valve area (Vmax):    2.23 cm^2. Valve area (Vmean): 1.97 cm^2.  - Systemic veins: IVC is small, suggesting low RA pressure and and    hypovolemia.  - Technically adequate study.   Assessment and Plan:  1. Palpitations   2. Essential hypertension     1. Palpitations Currently denies any palpitations or arrhythmias.  Continue metoprolol 12.5 mg p.o. twice daily.  She states sometimes she will skip a dose of metoprolol if her blood pressure gets too low.  EKG today shows normal sinus rhythm with a rate of 93.  No ectopy or ST/T wave changes noted.  2. Essential hypertension Blood pressure well controlled on current medications.  BP today 130/70.   continue metoprolol 12.5 mg daily.   Medication Adjustments/Labs and Tests Ordered: Current medicines are reviewed at length with the patient today.  Concerns regarding medicines are outlined above.    Disposition: Follow-up with Dr. Harl Bowie or APP 6  months  signed, Levell July, NP 11/06/2020 2:46 PM    [facility] at [place], [place], [place] [address] Phone: [phone]; Fax: [phone]

## 2020-11-06 ENCOUNTER — Encounter: Payer: Self-pay | Admitting: Family Medicine

## 2020-11-06 ENCOUNTER — Ambulatory Visit: Payer: Medicare Other | Admitting: Family Medicine

## 2020-11-06 VITALS — BP 130/70 | HR 92 | Ht 67.5 in | Wt 177.5 lb

## 2020-11-06 DIAGNOSIS — R002 Palpitations: Secondary | ICD-10-CM

## 2020-11-06 DIAGNOSIS — I10 Essential (primary) hypertension: Secondary | ICD-10-CM | POA: Diagnosis not present

## 2020-11-06 NOTE — Patient Instructions (Signed)
Medication Instructions:  Your physician recommends that you continue on your current medications as directed. Please refer to the Current Medication list given to you today.  *If you need a refill on your cardiac medications before your next appointment, please call your pharmacy*   Lab Work: None If you have labs (blood work) drawn today and your tests are completely normal, you will receive your results only by: [place] (if you have MyChart) OR A paper copy in the mail If you have any lab test that is abnormal or we need to change your treatment, we will call you to review the results.   Testing/Procedures: None   Follow-Up: At [facility], you and your health needs are our priority.  As part of our continuing mission to provide you with exceptional heart care, we have created designated Provider Care Teams.  These Care Teams include your primary Cardiologist (physician) and Advanced Practice Providers (APPs -  Physician Assistants and Nurse Practitioners) who all work together to provide you with the care you need, when you need it.  We recommend signing up for the patient portal called "MyChart".  Sign up information is provided on this After Visit Summary.  MyChart is used to connect with patients for Virtual Visits (Telemedicine).  Patients are able to view lab/test results, encounter notes, upcoming appointments, etc.  Non-urgent messages can be sent to your provider as well.   To learn more about what you can do with MyChart, go to [email or web address].    Your next appointment:   6 month(s)  The format for your next appointment:   In Person  Provider:   Katina Dung, NP   Other Instructions

## 2020-11-19 ENCOUNTER — Other Ambulatory Visit (HOSPITAL_COMMUNITY): Payer: Self-pay | Admitting: Surgery

## 2020-11-19 DIAGNOSIS — C029 Malignant neoplasm of tongue, unspecified: Secondary | ICD-10-CM

## 2020-11-21 ENCOUNTER — Inpatient Hospital Stay (HOSPITAL_COMMUNITY): Payer: Medicare Other | Attending: Hematology

## 2020-11-21 ENCOUNTER — Other Ambulatory Visit: Payer: Self-pay

## 2020-11-21 DIAGNOSIS — Z85810 Personal history of malignant neoplasm of tongue: Secondary | ICD-10-CM | POA: Diagnosis present

## 2020-11-21 DIAGNOSIS — Z9221 Personal history of antineoplastic chemotherapy: Secondary | ICD-10-CM | POA: Diagnosis not present

## 2020-11-21 DIAGNOSIS — Z923 Personal history of irradiation: Secondary | ICD-10-CM | POA: Diagnosis not present

## 2020-11-21 DIAGNOSIS — E039 Hypothyroidism, unspecified: Secondary | ICD-10-CM | POA: Insufficient documentation

## 2020-11-21 DIAGNOSIS — D61818 Other pancytopenia: Secondary | ICD-10-CM | POA: Diagnosis present

## 2020-11-21 DIAGNOSIS — I10 Essential (primary) hypertension: Secondary | ICD-10-CM | POA: Diagnosis not present

## 2020-11-21 DIAGNOSIS — C029 Malignant neoplasm of tongue, unspecified: Secondary | ICD-10-CM

## 2020-11-21 DIAGNOSIS — Z79899 Other long term (current) drug therapy: Secondary | ICD-10-CM | POA: Diagnosis not present

## 2020-11-21 LAB — COMPREHENSIVE METABOLIC PANEL
ALT: 19 U/L (ref 0–44)
AST: 23 U/L (ref 15–41)
Albumin: 3.7 g/dL (ref 3.5–5.0)
Alkaline Phosphatase: 67 U/L (ref 38–126)
Anion gap: 8 (ref 5–15)
BUN: 25 mg/dL — ABNORMAL HIGH (ref 8–23)
CO2: 25 mmol/L (ref 22–32)
Calcium: 9.3 mg/dL (ref 8.9–10.3)
Chloride: 103 mmol/L (ref 98–111)
Creatinine, Ser: 0.85 mg/dL (ref 0.44–1.00)
GFR, Estimated: >60 mL/min (ref >60)
Glucose, Bld: 114 mg/dL — ABNORMAL HIGH (ref 70–99)
Potassium: 3.9 mmol/L (ref 3.5–5.1)
Sodium: 136 mmol/L (ref 135–145)
Total Bilirubin: 0.5 mg/dL (ref 0.3–1.2)
Total Protein: 7 g/dL (ref 6.5–8.1)

## 2020-11-21 LAB — CBC WITH DIFFERENTIAL/PLATELET
Abs Immature Granulocytes: 0.01 10*3/uL (ref 0.00–0.07)
Basophils Absolute: 0 10*3/uL (ref 0.0–0.1)
Basophils Relative: 1 %
Eosinophils Absolute: 0.1 10*3/uL (ref 0.0–0.5)
Eosinophils Relative: 4 %
HCT: 41 % (ref 36.0–46.0)
Hemoglobin: 14 g/dL (ref 12.0–15.0)
Immature Granulocytes: 0 %
Lymphocytes Relative: 19 %
Lymphs Abs: 0.6 10*3/uL — ABNORMAL LOW (ref 0.7–4.0)
MCH: 29.5 pg (ref 26.0–34.0)
MCHC: 34.1 g/dL (ref 30.0–36.0)
MCV: 86.5 fL (ref 80.0–100.0)
Monocytes Absolute: 0.4 10*3/uL (ref 0.1–1.0)
Monocytes Relative: 12 %
Neutro Abs: 2 10*3/uL (ref 1.7–7.7)
Neutrophils Relative %: 64 %
Platelets: 168 10*3/uL (ref 150–400)
RBC: 4.74 MIL/uL (ref 3.87–5.11)
RDW: 13 % (ref 11.5–15.5)
WBC: 3.1 10*3/uL — ABNORMAL LOW (ref 4.0–10.5)
nRBC: 0 % (ref 0.0–0.2)

## 2020-11-21 LAB — TSH: TSH: 2.759 u[IU]/mL (ref 0.350–4.500)

## 2020-11-21 LAB — VITAMIN D 25 HYDROXY (VIT D DEFICIENCY, FRACTURES): Vit D, 25-Hydroxy: 32.02 ng/mL (ref 30–100)

## 2020-11-21 LAB — FOLATE: Folate: 28.3 ng/mL (ref >5.9)

## 2020-11-21 LAB — VITAMIN B12: Vitamin B-12: 354 pg/mL (ref 180–914)

## 2020-11-23 LAB — DRVVT MIX: dRVVT Mix: 52.4 s — ABNORMAL HIGH (ref 0.0–40.4)

## 2020-11-23 LAB — LUPUS ANTICOAGULANT PANEL
DRVVT: 62.2 s — ABNORMAL HIGH (ref 0.0–47.0)
PTT Lupus Anticoagulant: 38.3 s (ref 0.0–51.9)

## 2020-11-23 LAB — DRVVT CONFIRM: dRVVT Confirm: 1.6 ratio — ABNORMAL HIGH (ref 0.8–1.2)

## 2020-11-25 ENCOUNTER — Other Ambulatory Visit: Payer: Self-pay

## 2020-11-25 ENCOUNTER — Ambulatory Visit (HOSPITAL_COMMUNITY)
Admission: RE | Admit: 2020-11-25 | Discharge: 2020-11-25 | Disposition: A | Discharge status: Home / Self Care | Payer: Medicare Other | Source: Ambulatory Visit | Attending: Internal Medicine | Admitting: Internal Medicine

## 2020-11-25 DIAGNOSIS — Z1231 Encounter for screening mammogram for malignant neoplasm of breast: Secondary | ICD-10-CM | POA: Diagnosis not present

## 2020-11-27 NOTE — Progress Notes (Signed)
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    01/19/2014 PET scan   Tongue mass with 2 level II Right neck nodes T2N2b, Stage IVA    02/08/2014 - 04/03/2014 Radiation Therapy   Initiation of radiation therapy with 70 Gy delivered to the right base of tongue/tonsil 63 Gy to high risk nodal echelons, 56 Gy to the intermediate r base of tongue/tonsil/bilateral neck nodes    02/08/2014 - 03/15/2014 Chemotherapy   weekly cisplatin, only 4 doses given, severe toxicity with neutropenia, dehydration, nausea, vomiting, hospitalization and obstipation    10/24/2015 PET scan   low level non malig range hyper metab in L apical sub solid pulm nodule, suspicious for low grade adeno. diffuse tongue hypermetab without CT correlation    10/28/2015 Procedure   Bronchoscopy navigation, electromagnetic navigation, bronchoscopy with biopsy of LUL lung lesion, placement of fiducial markers X 3    10/28/2015 Pathology Results   Scant benign lung tissue, no tumor seen      CANCER STAGING: Cancer Staging No matching staging information was found for the patient.  INTERVAL HISTORY:  Candace Lewis, a 80 y.o. female, returns for routine follow-up of her tongue cancer and pancytopenia. Candace Lewis was last seen on 11/21/18.   Today she reports feeling well. She reports a lump on the right side of her neck which has caused some tightness that  has limited her ROM in her neck, but she denies any trouble swallowing. She also complains of SOB upon exertion. She denies history of blood clots, but she reports that she bruises easily.   REVIEW OF SYSTEMS:  Review of Systems  Constitutional:  Negative for appetite change and fatigue.  HENT:   Negative for trouble swallowing.   Respiratory:  Positive for shortness of breath.   Cardiovascular:  Positive for palpitations.  Neurological:  Positive for headaches (R side 2/10).  Hematological:  Bruises/bleeds easily.  Psychiatric/Behavioral:  Positive for depression and sleep disturbance. The patient is nervous/anxious.   All other systems reviewed and are negative.  PAST MEDICAL/SURGICAL HISTORY:  Past Medical History:  Diagnosis Date   Arthritis    lower back   Constipation    Diverticulitis    Ectopic pregnancy 1977   Esophageal cancer ([place]) 2015   tongue cancer, throat cancer   GERD (gastroesophageal reflux disease)    Headache    migraines   Hypertension    no longer taking meds   Hypokalemia 2015   Hypothyroidism    Lung nodule, multiple 01/08/2015   Macular degeneration of right eye    Malignant neoplasm of tongue ([place]) 01/24/2014   Thyroid disease    Past Surgical History:  Procedure Laterality Date   BIOPSY PHARYNX  2015   BREAST BIOPSY Bilateral    benign   CHOLECYSTECTOMY  2005   ESOPHAGOGASTRODUODENOSCOPY  06/19/2014   Dr. Britta Mccreedy: LA Class B esophagitis. PEG tube in place. erythema in proximal esophagus.    ESOPHAGOGASTRODUODENOSCOPY (  EGD) WITH PROPOFOL N/A 03/11/2017   Web in upper third of esophagus s/p dilation with scope passage, small hiatal hernia, normal duodenum, no specimens collected   FUDUCIAL PLACEMENT N/A 10/28/2015   Procedure: PLACEMENT OF FUDUCIAL TIMES THREE; LEFT UPPER LOBE BIOPSY;  Surgeon: Grace Isaac, MD;  Location: [facility];  Service: Thoracic;  Laterality: N/A;   MASTECTOMY, PARTIAL Left 1984   no cancer, fibrocystic breast disease    SKIN SURGERY  1960   moles removed from various areas per patient report   TONSILLECTOMY  1950   per patient report   [place]   vaginal hyst per patient report   [place] N/A 10/28/2015   Procedure: VIDEO BRONCHOSCOPY WITH ENDOBRONCHIAL NAVIGATION;  Surgeon: Grace Isaac, MD;  Location: [facility];  Service: Thoracic;  Laterality: N/A;    SOCIAL HISTORY:  Social History   Socioeconomic History   Marital status: Widowed    Spouse name: Not on file   Number of children: Not on file   Years of education: Not on file   Highest education level: Not on file  Occupational History   Not on file  Tobacco Use   Smoking status: Former    Packs/day: 1.00    Years: 23.00    Pack years: 23.00    Types: Cigarettes    Quit date: 05/03/1982    Years since quitting: 38.6   Smokeless tobacco: Never  Vaping Use   Vaping Use: Never used  Substance and Sexual Activity   Alcohol use: No    Alcohol/week: 0.0 standard drinks   Drug use: No   Sexual activity: Never    Birth control/protection: None  Other Topics Concern   Not on file  Social History Narrative   Not on file   Social Determinants of Health   Financial Resource Strain: Not on file  Food Insecurity: Not on file  Transportation Needs: Not on file  Physical Activity: Not on file  Stress: Not on file  Social Connections: Not on file  Intimate Partner Violence: Not on file    FAMILY HISTORY:  No family history on file.  CURRENT MEDICATIONS:  Current Outpatient Medications  Medication Sig Dispense Refill   levothyroxine (SYNTHROID) 88 MCG tablet Take 88 mcg by mouth daily before breakfast.     metoprolol tartrate (LOPRESSOR) 25 MG tablet Take 12.5 mg by mouth 2 (two) times daily.     omeprazole (PRILOSEC) 20 MG capsule Take 20 mg by mouth daily.     rosuvastatin (CRESTOR) 20 MG tablet Take 20 mg by mouth at bedtime.     No current  facility-administered medications for this visit.    ALLERGIES:  Allergies  Allergen Reactions   Adhesive [Tape] Rash    Reports rash and blistering if any adhesive tape   Latex Rash    Rash and skin blistering per patient report    PHYSICAL EXAM:  Performance status (ECOG): 1 - Symptomatic but completely ambulatory  There were no vitals filed for this visit. Wt Readings from Last 3 Encounters:  11/06/20 177 lb 8 oz (80.5 kg)  09/03/20 174 lb (78.9 kg)  06/20/20 168 lb (76.2 kg)   Physical Exam Vitals reviewed.  Constitutional:      Appearance: Normal appearance.  HENT:     Mouth/Throat:     Comments: Thrush present on tongue Neck:     Comments: Bilateral fibrosis Cardiovascular:  Rate and Rhythm: Normal rate and regular rhythm.     Pulses: Normal pulses.     Heart sounds: Normal heart sounds.  Pulmonary:     Effort: Pulmonary effort is normal.     Breath sounds: Normal breath sounds.  Neurological:     General: No focal deficit present.     Mental Status: She is alert and oriented to person, place, and time.  Psychiatric:        Mood and Affect: Mood normal.        Behavior: Behavior normal.     LABORATORY DATA:  I have reviewed the labs as listed.  CBC Latest Ref Rng & Units 11/21/2020 11/15/2019 11/16/2018  WBC 4.0 - 10.5 K/uL 3.1(L) 2.9(L) 2.5(L)  Hemoglobin 12.0 - 15.0 g/dL 14.0 13.4 13.7  Hematocrit 36.0 - 46.0 % 41.0 40.2 40.5  Platelets 150 - 400 K/uL 168 161 117(L)   CMP Latest Ref Rng & Units 11/21/2020 11/15/2019 11/16/2018  Glucose 70 - 99 mg/dL 114(H) 106(H) 126(H)  BUN 8 - 23 mg/dL 25(H) 24(H) 25(H)  Creatinine 0.44 - 1.00 mg/dL 0.85 0.93 0.91  Sodium 135 - 145 mmol/L 136 137 138  Potassium 3.5 - 5.1 mmol/L 3.9 3.6 3.7  Chloride 98 - 111 mmol/L 103 102 104  CO2 22 - 32 mmol/L 25 26 28   Calcium 8.9 - 10.3 mg/dL 9.3 9.3 9.5  Total Protein 6.5 - 8.1 g/dL 7.0 6.8 6.7  Total Bilirubin 0.3 - 1.2 mg/dL 0.5 0.6 0.5  Alkaline Phos 38 - 126 U/L 67 63  49  AST 15 - 41 U/L 23 26 22   ALT 0 - 44 U/L 19 20 17     DIAGNOSTIC IMAGING:  I have independently reviewed the scans and discussed with the patient. MM 3D SCREEN BREAST BILATERAL  Result Date: 11/26/2020 CLINICAL DATA:  Screening. EXAM: DIGITAL SCREENING BILATERAL MAMMOGRAM WITH TOMOSYNTHESIS AND CAD TECHNIQUE: Bilateral screening digital craniocaudal and mediolateral oblique mammograms were obtained. Bilateral screening digital breast tomosynthesis was performed. The images were evaluated with computer-aided detection. COMPARISON:  None. ACR Breast Density Category c: The breast tissue is heterogeneously dense, which may obscure small masses FINDINGS: There are no findings suspicious for malignancy. IMPRESSION: No mammographic evidence of malignancy. A result letter of this screening mammogram will be mailed directly to the patient. RECOMMENDATION: Screening mammogram in one year. (Code:SM-B-01Y) BI-RADS CATEGORY  1: Negative. Electronically Signed   By: Everlean Alstrom M.D.   On: 11/26/2020 15:32     ASSESSMENT:  1.  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  No enlarged lymph nodes.    2.  Nutrition: - She is not able to eat any solid foods since she completed chemoradiation therapy in December 2015. - She drinks 8 ounces of Ensure with 8 ounces of milk 4 times daily.   3.  Pancytopenia: -She had leukopenia and thrombocytopenia since she finished chemoradiation therapy. -Bone marrow biopsy on 03/30/2018 shows normocellular marrow with erythroid hyperplasia and minimal dyspoiesis.   PLAN:  1.  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  Thrush on the tongue was noted.  Severe radiation fibrosis of the neck present.  No palpable  adenopathy. - No evidence of recurrence.  Reviewed labs which showed normal LFTs and creatinine.  TSH was normal. - She has positive lupus anticoagulant which was checked for unknown reason.  She does not have any history of thrombosis. - Recommend follow-up in 1 year.   2.  Nutrition: -She is not able to eat any solid food since radiation. - Continue nutritional supplement along with milk 4 times daily.  Weight is stable.   3.  Pancytopenia: -Hemoglobin and platelet count are in the normal range.  White count is mildly low at 3.1 with normal ANC.  She does not have any recurrent infections.   4.  Health maintenance: -Mammogram on 11/25/2020 reviewed by me was BI-RADS Category 1.   Orders placed this encounter:  No orders of the defined types were placed in this encounter.    Derek Jack, MD [place] [phone]   I, Thana Ates, am acting as a scribe for Dr. Derek Jack.  I, Derek Jack MD, have reviewed the above documentation for accuracy and completeness, and I agree with the above.

## 2020-11-28 ENCOUNTER — Inpatient Hospital Stay (HOSPITAL_COMMUNITY): Payer: Medicare Other | Admitting: Hematology

## 2020-11-28 ENCOUNTER — Other Ambulatory Visit: Payer: Self-pay

## 2020-11-28 VITALS — BP 126/80 | HR 100 | Temp 98.1°F | Resp 19 | Wt 179.6 lb

## 2020-11-28 DIAGNOSIS — D61818 Other pancytopenia: Secondary | ICD-10-CM | POA: Diagnosis not present

## 2020-11-28 DIAGNOSIS — C029 Malignant neoplasm of tongue, unspecified: Secondary | ICD-10-CM | POA: Diagnosis not present

## 2020-11-28 NOTE — Patient Instructions (Addendum)
[facility] at [facility] Discharge Instructions  You were seen today by Dr. Katragadda. He went over your recent results. Dr. Katragadda will see you back in 1 year for labs and follow up.   Thank you for choosing [facility] at [facility] to provide your oncology and hematology care.  To afford each patient quality time with our provider, please arrive at least 15 minutes before your scheduled appointment time.   If you have a lab appointment with the Cancer Center please come in thru the Main Entrance and check in at the main information desk  You need to re-schedule your appointment should you arrive 10 or more minutes late.  We strive to give you quality time with our providers, and arriving late affects you and other patients whose appointments are after yours.  Also, if you no show three or more times for appointments you may be dismissed from the clinic at the providers discretion.     Again, thank you for choosing [facility].  Our hope is that these requests will decrease the amount of time that you wait before being seen by our physicians.       _____________________________________________________________  Should you have questions after your visit to [facility], please contact our office at [phone] between the hours of 8:00 a.m. and 4:30 p.m.  Voicemails left after 4:00 p.m. will not be returned until the following business day.  For prescription refill requests, have your pharmacy contact our office and allow 72 hours.    Cancer Center Support Programs:   > Cancer Support Group  2nd Tuesday of the month 1pm-2pm, Journey Room    

## 2020-12-17 ENCOUNTER — Other Ambulatory Visit: Payer: Self-pay

## 2020-12-17 ENCOUNTER — Encounter: Payer: Self-pay | Admitting: Orthopaedic Surgery

## 2020-12-17 ENCOUNTER — Ambulatory Visit: Payer: Medicare Other | Admitting: Orthopaedic Surgery

## 2020-12-17 DIAGNOSIS — M79645 Pain in left finger(s): Secondary | ICD-10-CM | POA: Diagnosis not present

## 2020-12-17 DIAGNOSIS — M79642 Pain in left hand: Secondary | ICD-10-CM

## 2020-12-17 NOTE — Progress Notes (Signed)
My hand is hurting again.  She has pain of the left long finger MCP joint just ulnarward.  She has swelling at times.  She has declined to see Copy.  I have injected it in the past and it has helped.  She wants another injection today.  She has some slight swelling of the area just ulnarward of the MCP joint dorsally of the long finger on the left.  NV intact. ROM is full.  Encounter Diagnosis  Name Primary?   Pain in left hand Yes   Procedure note: After permission from the patient and prep of the area, the ulnar side of the MCP joint of the left long finger was injected by sterile technique with 1% xylocaine and 6 mgm of Celestone tolerated well.  Return prn.  Call if any problem.  Precautions discussed.  Electronically Signed Sanjuana Kava, MD 8/16/202210:04 AM

## 2021-01-09 ENCOUNTER — Other Ambulatory Visit (HOSPITAL_COMMUNITY): Payer: Self-pay

## 2021-01-17 ENCOUNTER — Telehealth: Payer: Self-pay | Admitting: Family Medicine

## 2021-01-17 NOTE — Telephone Encounter (Signed)
Patient's  husbands death anniversary is tomorrow and she has become anxious. I asked her to take her ativan she has as directed. She agrees and will monitor.She will go to the ED over the weekend if symptoms worsen.

## 2021-01-17 NOTE — Telephone Encounter (Signed)
STAT if HR is under 50 or over 120 (normal HR is 60-100 beats per minute)  What is your heart rate? 98   Do you have a log of your heart rate readings (document readings)?  Couple hours ago  hr is 98  bp is 125/85   Do you have any other symptoms? Feeling agitated, sob

## 2021-06-01 NOTE — Progress Notes (Signed)
Cardiology Office Note:   Date:  06/02/2021  NAME:  Candace Lewis    MRN: [id number] DOB:  06-28-1940   PCP:  Adaline Sill, NP  Cardiologist:  Kate Sable, MD (Inactive)  Electrophysiologist:  None   Referring MD: Adaline Sill, NP   Chief Complaint  Patient presents with   Follow-up   History of Present Illness:   Candace Lewis is a 81 y.o. female with a hx of HTN, palpitations who presents for follow-up.  She reports he is doing well.  Denies any palpitations.  Blood pressure slightly elevated but she is going through difficulties with her home Internet service as well as living in a remote location.  She reports she does not like going to areas with lots of traffic. BP is well controlled at home. No structured exercise but no limitations.  She does activity around the house.  No chest pain or trouble breathing.  She does have a history of esophageal cancer. She cannot have food but does a liquid supplement. No CP or SOB.   Past Medical History: Past Medical History:  Diagnosis Date   Arthritis    lower back   Constipation    Diverticulitis    Ectopic pregnancy 1977   Esophageal cancer ([place]) 2015   tongue cancer, throat cancer   GERD (gastroesophageal reflux disease)    Headache    migraines   Hypertension    no longer taking meds   Hypokalemia 2015   Hypothyroidism    Lung nodule, multiple 01/08/2015   Macular degeneration of right eye    Malignant neoplasm of tongue ([place]) 01/24/2014   Thyroid disease     Past Surgical History: Past Surgical History:  Procedure Laterality Date   BIOPSY PHARYNX  2015   BREAST BIOPSY Bilateral    benign   CHOLECYSTECTOMY  2005   ESOPHAGOGASTRODUODENOSCOPY  06/19/2014   Dr. Britta Mccreedy: LA Class B esophagitis. PEG tube in place. erythema in proximal esophagus.    ESOPHAGOGASTRODUODENOSCOPY (EGD) WITH PROPOFOL N/A 03/11/2017   Web in upper third of esophagus s/p dilation with scope passage, small hiatal hernia, normal  duodenum, no specimens collected   FUDUCIAL PLACEMENT N/A 10/28/2015   Procedure: PLACEMENT OF FUDUCIAL TIMES THREE; LEFT UPPER LOBE BIOPSY;  Surgeon: Grace Isaac, MD;  Location: [facility];  Service: Thoracic;  Laterality: N/A;   MASTECTOMY, PARTIAL Left 1984   no cancer, fibrocystic breast disease   SKIN SURGERY  1960   moles removed from various areas per patient report   TONSILLECTOMY  1950   per patient report   [place]   vaginal hyst per patient report   [place] N/A 10/28/2015   Procedure: [place];  Surgeon: Grace Isaac, MD;  Location: [facility];  Service: Thoracic;  Laterality: N/A;    Current Medications: Current Meds  Medication Sig   levothyroxine (SYNTHROID) 88 MCG tablet Take 88 mcg by mouth daily before breakfast.   LORazepam (ATIVAN) 0.5 MG tablet Take 0.5 mg by mouth 2 (two) times daily as needed.   metoprolol tartrate (LOPRESSOR) 25 MG tablet Take 12.5 mg by mouth 2 (two) times daily.   omeprazole (PRILOSEC) 20 MG capsule Take 20 mg by mouth daily.   rosuvastatin (CRESTOR) 20 MG tablet Take 20 mg by mouth at bedtime.     Allergies:    Adhesive [tape] and Latex   Social History: Social  History   Socioeconomic History   Marital status: Widowed    Spouse name: Not on file   Number of children: 2   Years of education: Not on file   Highest education level: Not on file  Occupational History   Occupation: retired  Tobacco Use   Smoking status: Former    Packs/day: 1.00    Years: 23.00    Pack years: 23.00    Types: Cigarettes    Quit date: 05/03/1982    Years since quitting: 39.1   Smokeless tobacco: Never  Vaping Use   Vaping Use: Never used  Substance and Sexual Activity   Alcohol use: No    Alcohol/week: 0.0 standard drinks   Drug use: No   Sexual activity: Never    Birth control/protection: None  Other Topics Concern   Not  on file  Social History Narrative   Not on file   Social Determinants of Health   Financial Resource Strain: Not on file  Food Insecurity: Not on file  Transportation Needs: Not on file  Physical Activity: Not on file  Stress: Not on file  Social Connections: Not on file     Family History: The patient's family history includes Heart disease in her mother.  ROS:   All other ROS reviewed and negative. Pertinent positives noted in the HPI.     EKGs/Labs/Other Studies Reviewed:   The following studies were personally reviewed by me today:  TTE 07/07/2017 - Left ventricle: The cavity size was normal. Wall thickness was    normal. Systolic function was normal. The estimated ejection    fraction was in the range of 55% to 60%. Wall motion was normal;    there were no regional wall motion abnormalities. The study is    not technically sufficient to allow evaluation of LV diastolic    function.  - Aortic valve: Valve area (VTI): 2.29 cm^2. Valve area (Vmax):    2.23 cm^2. Valve area (Vmean): 1.97 cm^2.  - Systemic veins: IVC is small, suggesting low RA pressure and and    hypovolemia.  - Technically adequate study.   Recent Labs: 11/21/2020: ALT 19; BUN 25; Creatinine, Ser 0.85; Hemoglobin 14.0; Platelets 168; Potassium 3.9; Sodium 136; TSH 2.759   Recent Lipid Panel No results found for: CHOL, TRIG, HDL, CHOLHDL, VLDL, LDLCALC, LDLDIRECT  Physical Exam:   VS:  BP (!) 150/92    Pulse 87    Ht 5' 7.5" (1.715 m)    Wt 185 lb 12.8 oz (84.3 kg)    SpO2 100%    BMI 28.67 kg/m    Wt Readings from Last 3 Encounters:  06/02/21 185 lb 12.8 oz (84.3 kg)  11/28/20 179 lb 9.6 oz (81.5 kg)  11/06/20 177 lb 8 oz (80.5 kg)    General: Well nourished, well developed, in no acute distress Head: Atraumatic, normal size  Eyes: PEERLA, EOMI  Neck: Supple, no JVD Endocrine: No thryomegaly Cardiac: Normal S1, S2; RRR; no murmurs, rubs, or gallops Lungs: Clear to auscultation bilaterally, no  wheezing, rhonchi or rales  Abd: Soft, nontender, no hepatomegaly  Ext: No edema, pulses 2+ Musculoskeletal: No deformities, BUE and BLE strength normal and equal Skin: Warm and dry, no rashes   Neuro: Alert and oriented to person, place, time, and situation, CNII-XII grossly intact, no focal deficits  Psych: Normal mood and affect   ASSESSMENT:   Candace Lewis is a 81 y.o. female who presents for the following: 1. Palpitations  2. Essential hypertension     PLAN:   1. Palpitations -Denies symptoms.  No prior arrhythmias diagnosed.  She will continue her metoprolol.  Seems to be controlled on this.  Unclear if she ever had ectopy or any SVT.  Everything is reassuring on exam today.  She can see [place] as needed.  2. Essential hypertension -BP elevated today.  Well-controlled at home.  She describes a lot of stress today.  Suspect this is the bigger issue.  She will continue to keep an eye on it.      Disposition: Return in about 1 year (around 06/02/2022).  Medication Adjustments/Labs and Tests Ordered: Current medicines are reviewed at length with the patient today.  Concerns regarding medicines are outlined above.  No orders of the defined types were placed in this encounter.  No orders of the defined types were placed in this encounter.   Patient Instructions  Medication Instructions:  Your physician recommends that you continue on your current medications as directed. Please refer to the Current Medication list given to you today.    Labwork: None today  Testing/Procedures: None today  Follow-Up: As needed     Time Spent with Patient: I have spent a total of 25 minutes with patient reviewing hospital notes, telemetry, EKGs, labs and examining the patient as well as establishing an assessment and plan that was discussed with the patient.  > 50% of time was spent in direct patient care.  Signed, Addison Naegeli. Audie Box, MD, [facility]  [address], [place], [place] [address] [phone]  06/02/2021 10:32 AM

## 2021-06-02 ENCOUNTER — Encounter: Payer: Self-pay | Admitting: Cardiovascular Disease

## 2021-06-02 ENCOUNTER — Other Ambulatory Visit: Payer: Self-pay

## 2021-06-02 ENCOUNTER — Ambulatory Visit: Payer: Medicare Other | Admitting: Cardiovascular Disease

## 2021-06-02 VITALS — BP 150/92 | HR 87 | Ht 67.5 in | Wt 185.8 lb

## 2021-06-02 DIAGNOSIS — R002 Palpitations: Secondary | ICD-10-CM

## 2021-06-02 DIAGNOSIS — I10 Essential (primary) hypertension: Secondary | ICD-10-CM

## 2021-06-02 NOTE — Patient Instructions (Signed)
Medication Instructions:  Your physician recommends that you continue on your current medications as directed. Please refer to the Current Medication list given to you today.    Labwork: None today  Testing/Procedures: None today  Follow-Up: As needed

## 2021-06-08 DIAGNOSIS — R079 Chest pain, unspecified: Secondary | ICD-10-CM | POA: Insufficient documentation

## 2021-08-19 ENCOUNTER — Ambulatory Visit: Payer: Medicare Other

## 2021-08-19 ENCOUNTER — Ambulatory Visit: Payer: Medicare Other | Admitting: Orthopaedic Surgery

## 2021-08-19 ENCOUNTER — Encounter: Payer: Self-pay | Admitting: Orthopaedic Surgery

## 2021-08-19 VITALS — BP 172/93 | HR 114 | Ht 67.5 in | Wt 182.8 lb

## 2021-08-19 DIAGNOSIS — K2100 Gastro-esophageal reflux disease with esophagitis, without bleeding: Secondary | ICD-10-CM

## 2021-08-19 DIAGNOSIS — M79605 Pain in left leg: Secondary | ICD-10-CM | POA: Diagnosis not present

## 2021-08-19 DIAGNOSIS — M545 Low back pain, unspecified: Secondary | ICD-10-CM

## 2021-08-19 DIAGNOSIS — K219 Gastro-esophageal reflux disease without esophagitis: Secondary | ICD-10-CM | POA: Insufficient documentation

## 2021-08-19 MED ORDER — DIAZEPAM 10 MG PO TABS
10.0000 mg | ORAL_TABLET | Freq: Once | ORAL | 0 refills | Status: AC
Start: 2021-08-19 — End: 2021-08-19

## 2021-08-19 MED ORDER — PREDNISONE 5 MG (21) PO TBPK: ORAL_TABLET | ORAL | 0 refills | Status: DC

## 2021-08-19 NOTE — Progress Notes (Signed)
My back is hurting. ? ?She did some work around the house last week and has developed lower back pain with left sided sciatica.  Her pain runs to the left buttock and upper thigh but not below. ? ?She has tried Aspercreme, rest, heat, ice, Tylenol with no help. ? ?She has had prior history of back pain years ago and had epidurals years ago.  Dr. Marnette Burgess sent her for this.  He has been retired over 15 years or more. ? ?She has no bowel or bladder issues.  She has no weakness. ? ?She has significant esophagus problems post cancer and can only take very small pills and usually takes a liquid medicine if needed.  She has tried liquid Tylenol but it burns. ? ?Spine/Pelvis examination: ? Inspection:  Overall, sacoiliac joint benign and hips nontender; without crepitus or defects. ? ? Thoracic spine inspection: Alignment normal without kyphosis present ? ? Lumbar spine inspection:  Alignment  with normal lumbar lordosis, without scoliosis apparent. ? ? Thoracic spine palpation:  without tenderness of spinal processes ? ? Lumbar spine palpation: with tenderness of lumbar area; with tightness of lumbar muscles but no spasm ? ? Range of Motion: ?  Lumbar flexion, forward flexion is abnormal with pain and tenderness ROM is limited to 15 degrees. ?  Lumbar extension is abnormal with about 0 degrees and pain and tenderness ?  Left lateral bend is abnormal with to about 5 degrees with pain and tenderness. ?  Right lateral bend is normal without pain or tenderness ?  Straight leg raising is normal ? Strength & tone: normal ? ? Stability overall normal stability ? ?Encounter Diagnoses  ?Name Primary?  ? Lumbar pain with radiation down left leg Yes  ? Gastroesophageal reflux disease with esophagitis without hemorrhage   ? ? ?X-rays were done of the lumbar spine, reported separately. ? ?I will get MRI of the lumbar spine with and without contrast. ? ?I will call in prednisone dose pack.  She is to grind the pills and take with her  Ensure.  I went over precautions. ? ?I have given Valium 10 to take before MRI.  Grind up and take with liquid.  Have someone drive her, do not drive car while on the Valium. ? ?Return in two weeks. ? ?Call if any problem. ? ?Precautions discussed. ? ?Electronically Signed ?Sanjuana Kava, MD ?4/18/20239:58 AM ? ?

## 2021-09-02 ENCOUNTER — Ambulatory Visit: Payer: Medicare Other | Admitting: Orthopaedic Surgery

## 2021-09-04 ENCOUNTER — Ambulatory Visit (HOSPITAL_COMMUNITY)
Admission: RE | Admit: 2021-09-04 | Discharge: 2021-09-04 | Disposition: A | Discharge status: Home / Self Care | Payer: Medicare Other | Source: Ambulatory Visit | Attending: Orthopaedic Surgery | Admitting: Orthopaedic Surgery

## 2021-09-04 DIAGNOSIS — M545 Low back pain, unspecified: Secondary | ICD-10-CM | POA: Diagnosis not present

## 2021-09-04 DIAGNOSIS — M79605 Pain in left leg: Secondary | ICD-10-CM | POA: Diagnosis present

## 2021-09-04 MED ORDER — GADOBUTROL 1 MMOL/ML IV SOLN
7.5000 mL | Freq: Once | INTRAVENOUS | Status: AC | PRN
  Administered 2021-09-04: 7.5 mL via INTRAVENOUS

## 2021-09-09 ENCOUNTER — Ambulatory Visit: Payer: Medicare Other | Admitting: Orthopaedic Surgery

## 2021-09-09 ENCOUNTER — Encounter: Payer: Self-pay | Admitting: Orthopaedic Surgery

## 2021-09-09 DIAGNOSIS — M545 Low back pain, unspecified: Secondary | ICD-10-CM

## 2021-09-09 DIAGNOSIS — M79605 Pain in left leg: Secondary | ICD-10-CM

## 2021-09-09 NOTE — Progress Notes (Signed)
I am a little better. ? ?She has been using Aspercreme and a heating pad and her back is much better. ? 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 ?Subarticular stenosis bilaterally, left greater than right L4-5. ?Probable left L5 nerve root impingement in the subarticular zone ?  ?I have independently reviewed the MRI.   ? ?I have explained the findings to her.   She is better. She may need epidural if the pain returns but for now she can continue as she is doing. ? ?Spine/Pelvis examination: ? Inspection:  Overall, sacoiliac joint benign and hips nontender; without crepitus or defects. ? ? Thoracic spine inspection: Alignment normal without kyphosis present ? ? Lumbar spine inspection:  Alignment  with normal lumbar lordosis, without scoliosis apparent. ? ? Thoracic spine palpation:  without tenderness of spinal processes ? ? Lumbar spine palpation: without tenderness of lumbar area; without tightness of lumbar muscles  ? ? Range of Motion: ?  Lumbar flexion, forward flexion is normal without pain or tenderness  ?  Lumbar extension is full without pain or tenderness ?  Left lateral bend is normal without pain or tenderness ?  Right lateral bend is normal without pain or tenderness ?  Straight leg raising is normal ? Strength & tone: normal ? ? Stability overall normal stability ? ?Encounter Diagnosis  ?Name Primary?  ? Lumbar pain with radiation down left leg Yes  ? ?I will see her as needed. ? ?Call if any problem. ? ?Precautions discussed. ? ?Electronically Signed ?Sanjuana Kava, MD ?5/9/202310:28 AM ? ?

## 2021-10-13 ENCOUNTER — Other Ambulatory Visit (HOSPITAL_COMMUNITY): Payer: Self-pay | Admitting: Internal Medicine

## 2021-10-13 DIAGNOSIS — N644 Mastodynia: Secondary | ICD-10-CM

## 2021-10-28 ENCOUNTER — Ambulatory Visit (HOSPITAL_COMMUNITY)
Admission: RE | Admit: 2021-10-28 | Discharge: 2021-10-28 | Disposition: A | Discharge status: Home / Self Care | Payer: Medicare Other | Source: Ambulatory Visit | Attending: Internal Medicine | Admitting: Internal Medicine

## 2021-10-28 DIAGNOSIS — N644 Mastodynia: Secondary | ICD-10-CM | POA: Insufficient documentation

## 2021-10-29 ENCOUNTER — Other Ambulatory Visit (HOSPITAL_COMMUNITY): Payer: Self-pay | Admitting: Emergency Medicine

## 2021-10-29 ENCOUNTER — Other Ambulatory Visit: Payer: Self-pay | Admitting: Emergency Medicine

## 2021-10-29 DIAGNOSIS — R928 Other abnormal and inconclusive findings on diagnostic imaging of breast: Secondary | ICD-10-CM

## 2021-10-29 DIAGNOSIS — N6001 Solitary cyst of right breast: Secondary | ICD-10-CM

## 2021-10-29 DIAGNOSIS — R921 Mammographic calcification found on diagnostic imaging of breast: Secondary | ICD-10-CM

## 2021-10-30 ENCOUNTER — Other Ambulatory Visit: Payer: Self-pay | Admitting: Internal Medicine

## 2021-11-06 ENCOUNTER — Other Ambulatory Visit: Payer: Medicare Other

## 2021-11-10 HISTORY — PX: BREAST BIOPSY: SHX20

## 2021-11-17 ENCOUNTER — Ambulatory Visit
Admission: RE | Admit: 2021-11-17 | Discharge: 2021-11-17 | Disposition: A | Discharge status: Home / Self Care | Payer: Medicare Other | Source: Ambulatory Visit | Attending: Emergency Medicine | Admitting: Emergency Medicine

## 2021-11-17 DIAGNOSIS — R928 Other abnormal and inconclusive findings on diagnostic imaging of breast: Secondary | ICD-10-CM

## 2021-11-17 DIAGNOSIS — R921 Mammographic calcification found on diagnostic imaging of breast: Secondary | ICD-10-CM

## 2021-11-17 DIAGNOSIS — N6001 Solitary cyst of right breast: Secondary | ICD-10-CM

## 2021-11-25 ENCOUNTER — Inpatient Hospital Stay (HOSPITAL_COMMUNITY): Payer: Medicare Other | Attending: Hematology | Admitting: Hematology

## 2021-11-25 ENCOUNTER — Other Ambulatory Visit (HOSPITAL_COMMUNITY)
Admission: RE | Admit: 2021-11-25 | Discharge: 2021-11-25 | Disposition: A | Discharge status: Home / Self Care | Payer: Medicare Other | Source: Ambulatory Visit | Attending: Hematology | Admitting: Hematology

## 2021-11-25 ENCOUNTER — Encounter (HOSPITAL_COMMUNITY): Payer: Self-pay | Admitting: Hematology

## 2021-11-25 ENCOUNTER — Other Ambulatory Visit (HOSPITAL_COMMUNITY): Payer: Medicare Other

## 2021-11-25 VITALS — BP 153/85 | HR 84 | Temp 97.9°F | Resp 18 | Ht 67.5 in | Wt 181.9 lb

## 2021-11-25 DIAGNOSIS — Z923 Personal history of irradiation: Secondary | ICD-10-CM | POA: Diagnosis not present

## 2021-11-25 DIAGNOSIS — E039 Hypothyroidism, unspecified: Secondary | ICD-10-CM | POA: Diagnosis not present

## 2021-11-25 DIAGNOSIS — D0511 Intraductal carcinoma in situ of right breast: Secondary | ICD-10-CM | POA: Insufficient documentation

## 2021-11-25 DIAGNOSIS — C099 Malignant neoplasm of tonsil, unspecified: Secondary | ICD-10-CM | POA: Diagnosis not present

## 2021-11-25 DIAGNOSIS — E559 Vitamin D deficiency, unspecified: Secondary | ICD-10-CM | POA: Diagnosis not present

## 2021-11-25 DIAGNOSIS — Z87891 Personal history of nicotine dependence: Secondary | ICD-10-CM | POA: Diagnosis not present

## 2021-11-25 DIAGNOSIS — Z801 Family history of malignant neoplasm of trachea, bronchus and lung: Secondary | ICD-10-CM | POA: Diagnosis not present

## 2021-11-25 DIAGNOSIS — I10 Essential (primary) hypertension: Secondary | ICD-10-CM | POA: Insufficient documentation

## 2021-11-25 DIAGNOSIS — Z170 Estrogen receptor positive status [ER+]: Secondary | ICD-10-CM | POA: Diagnosis not present

## 2021-11-25 DIAGNOSIS — Z85810 Personal history of malignant neoplasm of tongue: Secondary | ICD-10-CM | POA: Insufficient documentation

## 2021-11-25 DIAGNOSIS — Z79899 Other long term (current) drug therapy: Secondary | ICD-10-CM | POA: Diagnosis not present

## 2021-11-25 DIAGNOSIS — Z9221 Personal history of antineoplastic chemotherapy: Secondary | ICD-10-CM | POA: Insufficient documentation

## 2021-11-25 DIAGNOSIS — C029 Malignant neoplasm of tongue, unspecified: Secondary | ICD-10-CM

## 2021-11-25 LAB — CBC WITH DIFFERENTIAL/PLATELET
Abs Immature Granulocytes: 0.01 10*3/uL (ref 0.00–0.07)
Basophils Absolute: 0 10*3/uL (ref 0.0–0.1)
Basophils Relative: 1 %
Eosinophils Absolute: 0.1 10*3/uL (ref 0.0–0.5)
Eosinophils Relative: 2 %
HCT: 41.1 % (ref 36.0–46.0)
Hemoglobin: 13.8 g/dL (ref 12.0–15.0)
Immature Granulocytes: 0 %
Lymphocytes Relative: 21 %
Lymphs Abs: 0.7 10*3/uL (ref 0.7–4.0)
MCH: 28.7 pg (ref 26.0–34.0)
MCHC: 33.6 g/dL (ref 30.0–36.0)
MCV: 85.4 fL (ref 80.0–100.0)
Monocytes Absolute: 0.3 10*3/uL (ref 0.1–1.0)
Monocytes Relative: 9 %
Neutro Abs: 2.3 10*3/uL (ref 1.7–7.7)
Neutrophils Relative %: 67 %
Platelets: 156 10*3/uL (ref 150–400)
RBC: 4.81 MIL/uL (ref 3.87–5.11)
RDW: 13.8 % (ref 11.5–15.5)
WBC: 3.5 10*3/uL — ABNORMAL LOW (ref 4.0–10.5)
nRBC: 0 % (ref 0.0–0.2)

## 2021-11-25 LAB — COMPREHENSIVE METABOLIC PANEL
ALT: 20 U/L (ref 0–44)
AST: 25 U/L (ref 15–41)
Albumin: 3.9 g/dL (ref 3.5–5.0)
Alkaline Phosphatase: 69 U/L (ref 38–126)
Anion gap: 6 (ref 5–15)
BUN: 24 mg/dL — ABNORMAL HIGH (ref 8–23)
CO2: 25 mmol/L (ref 22–32)
Calcium: 9.3 mg/dL (ref 8.9–10.3)
Chloride: 107 mmol/L (ref 98–111)
Creatinine, Ser: 0.92 mg/dL (ref 0.44–1.00)
GFR, Estimated: >60 mL/min (ref >60)
Glucose, Bld: 111 mg/dL — ABNORMAL HIGH (ref 70–99)
Potassium: 4.1 mmol/L (ref 3.5–5.1)
Sodium: 138 mmol/L (ref 135–145)
Total Bilirubin: 0.9 mg/dL (ref 0.3–1.2)
Total Protein: 7.2 g/dL (ref 6.5–8.1)

## 2021-11-25 LAB — TSH: TSH: 6.506 u[IU]/mL — ABNORMAL HIGH (ref 0.350–4.500)

## 2021-11-25 LAB — VITAMIN D 25 HYDROXY (VIT D DEFICIENCY, FRACTURES): Vit D, 25-Hydroxy: 30.83 ng/mL (ref 30–100)

## 2021-11-25 NOTE — Patient Instructions (Addendum)
[place] at [facility] Discharge Instructions  You were seen and examined today by Dr. Delton Coombes. Dr. Delton Coombes is a medical oncologist, meaning that he specializes in the treatment of cancer diagnoses. Dr. Delton Coombes discussed your past medical history, family history of cancers, and the events that led to you being here today.  You were referred back to Dr. Delton Coombes due to a new diagnosis of ductal carcinoma in situ (DCIS). This is a common type of pre-breast cancer. Having it does place you at a higher risk for developing invasive breast cancer.   The best course of treatment is to surgically remove the cancer, if it is left alone, it will become cancerous within time. You will be referred to a general surgeon of your choice, this surgery can be done in [place] or [place].  The DCIS is being fed by estrogen that occurs naturally within your body. Following surgery, Dr. Delton Coombes will then discuss the option of anti-estrogen therapy in the form of a pill for at least 5 years. This protects both breasts from the development of invasive breast cancer. Also following surgery, Dr. Delton Coombes will refer you back to a Radiation Oncologist to see if there is any need for radiation therapy.   Follow-up with Dr. Delton Coombes about 4 weeks after surgery.   Thank you for choosing [facility] to provide your oncology and hematology care.  To afford each patient quality time with our provider, please arrive at least 15 minutes before your scheduled appointment time.   If you have a lab appointment with the [facility] please come in thru the Main Entrance and check in at the main information desk.  You need to re-schedule your appointment should you arrive 10 or more minutes late.  We strive to give you quality time with our providers, and arriving late affects you and other patients whose appointments are after yours.  Also, if you no  show three or more times for appointments you may be dismissed from the clinic at the providers discretion.     Again, thank you for choosing [facility].  Our hope is that these requests will decrease the amount of time that you wait before being seen by our physicians.       _____________________________________________________________  Should you have questions after your visit to [facility], please contact our office at [phone] and follow the prompts.  Our office hours are 8:00 a.m. and 4:30 p.m. Monday - Friday.  Please note that voicemails left after 4:00 p.m. may not be returned until the following business day.  We are closed weekends and major holidays.  You do have access to a nurse 24-7, just call the main number to the clinic [phone] and do not press any options, hold on the line and a nurse will answer the phone.    For prescription refill requests, have your pharmacy contact our office and allow 72 hours.

## 2021-11-25 NOTE — Progress Notes (Signed)
[facility] [address], [place] [address]   Patient Care Team: Adaline Sill, NP as PCP - General (Internal Medicine) Herminio Commons, MD (Inactive) as PCP - Cardiology (Cardiology) Gala Romney Cristopher Estimable, MD as Consulting Physician (Gastroenterology) Derek Jack, MD as Medical Oncologist (Medical Oncology) Brien Mates, RN as Oncology Nurse Navigator (Medical Oncology)  CHIEF COMPLAINTS/PURPOSE OF CONSULTATION:  Newly diagnosed right breast DCIS  HISTORY OF PRESENTING ILLNESS:  Candace Lewis 81 y.o. female is here because of recent diagnosis of right breast DCIS.  Today she reports feeling good, and she is accompanied by her step-granddaughter. 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 She reports soreness at her biopsy site in her right breast. She was able to palpate a lump in her right breast the size of a walnut which was painful 2 months prior to her mammogram. She reports 3 prior surgical removal of cysts in her breast. She reports she has 1 ectopic pregnancy that miscarried at 3 months. She denies history of DVT and PE. She denies history of CVA and MI.   She lives at home on her own, and she is able to do all of her typical daily activities. 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 She quit smoking in 1984. Her maternal grandfather had lung cancer.  In terms of breast cancer risk profile:  She menarched at early age of 39 and she has a hysterectomy at 81 years old.   She had 1 pregnancy She never received birth control pills.  She was never exposed to fertility medications or hormone replacement therapy.  She has no family history of Breast/GYN/GI cancer  I reviewed her records extensively and collaborated the history with the patient.  SUMMARY OF ONCOLOGIC HISTORY: Oncology History  Malignant neoplasm of tongue ([place])  01/16/2014  Initial Biopsy   tongue biopsy with basaloid squamous cell carcinoma   01/19/2014 PET scan   Tongue mass with 2 level II Right neck nodes T2N2b, Stage IVA   02/08/2014 - 04/03/2014 Radiation Therapy   Initiation of radiation therapy with 70 Gy delivered to the right base of tongue/tonsil 63 Gy to high risk nodal echelons, 56 Gy to the intermediate r base of tongue/tonsil/bilateral neck nodes   02/08/2014 - 03/15/2014 Chemotherapy   weekly cisplatin, only 4 doses given, severe toxicity with neutropenia, dehydration, nausea, vomiting, hospitalization and obstipation   10/24/2015 PET scan   low level non malig range hyper metab in L apical sub solid pulm nodule, suspicious for low grade adeno. 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 Britta Mccreedy: LA Class B esophagitis. PEG tube in place. erythema in proximal esophagus.    ESOPHAGOGASTRODUODENOSCOPY (EGD) WITH PROPOFOL N/A 03/11/2017   Web in upper third of esophagus s/p dilation with  scope passage, small hiatal hernia, normal duodenum, no specimens collected   FUDUCIAL PLACEMENT N/A 10/28/2015   Procedure: PLACEMENT OF FUDUCIAL TIMES THREE; LEFT UPPER LOBE BIOPSY;  Surgeon: Grace Isaac, MD;  Location: [facility];  Service: Thoracic;  Laterality: N/A;   MASTECTOMY, PARTIAL Left 1984   no cancer, fibrocystic breast disease   SKIN SURGERY  1960   moles removed from various areas per patient report   TONSILLECTOMY  1950   per patient report   Susan Moore   vaginal hyst per patient report   [place] N/A 10/28/2015   Procedure: VIDEO BRONCHOSCOPY WITH ENDOBRONCHIAL NAVIGATION;  Surgeon: Grace Isaac, MD;  Location: [facility];  Service: Thoracic;  Laterality: N/A;    SOCIAL HISTORY: Social History   Socioeconomic History   Marital status: Widowed    Spouse name: Not on file   Number of children: 2   Years of education: Not on file   Highest education level: Not on file  Occupational History   Occupation: retired  Tobacco Use   Smoking status: Former    Packs/day: 1.00    Years: 23.00    Total pack years: 23.00    Types: Cigarettes    Quit date: 05/03/1982    Years since quitting: 39.5   Smokeless tobacco: Never  Vaping Use   Vaping Use: Never used  Substance and Sexual Activity   Alcohol use: No    Alcohol/week: 0.0 standard drinks of alcohol   Drug use: No   Sexual activity: Never    Birth control/protection: None  Other Topics Concern   Not on file  Social History Narrative   Not on file   Social Determinants of Health   Financial Resource Strain: Not on file  Food Insecurity: Not on file  Transportation Needs: Not on file  Physical Activity: Not on file  Stress: Not on file  Social Connections: Not on file  Intimate Partner Violence: Not on file    FAMILY HISTORY: Family History  Problem Relation Age of Onset   Heart disease Mother     ALLERGIES:  is  allergic to adhesive [tape] and latex.  MEDICATIONS:  Current Outpatient Medications  Medication Sig Dispense Refill   levothyroxine (SYNTHROID) 88 MCG tablet Take 88 mcg by mouth daily before breakfast.     metoprolol tartrate (LOPRESSOR) 25 MG tablet Take 12.5 mg by mouth 2 (two) times daily.     omeprazole (PRILOSEC) 20 MG capsule Take 20 mg by mouth daily.     rosuvastatin (CRESTOR) 20 MG tablet Take 20 mg by mouth at bedtime.     No current facility-administered medications for this visit.    REVIEW OF SYSTEMS:   Review of Systems  Constitutional:  Negative for appetite change and fatigue.  Cardiovascular:  Positive for chest pain (3/10 R breast) and palpitations.  Neurological:  Positive for headaches and numbness.  Psychiatric/Behavioral:  The patient is nervous/anxious.   All other systems reviewed and are negative.  PHYSICAL EXAMINATION: ECOG PERFORMANCE STATUS: 1 - Symptomatic but completely ambulatory  Vitals:   11/25/21 0805  BP: (!) 153/85  Pulse: 84  Resp: 18  Temp: 97.9 F (36.6 C)  SpO2: 98%   Filed Weights   11/25/21 0805  Weight: 181 lb 14.4 oz (82.5 kg)   Physical Exam Vitals reviewed.  Constitutional:      Appearance: Normal appearance.  Cardiovascular:     Rate and Rhythm: Normal rate and regular rhythm.     Pulses: Normal pulses.     Heart sounds: Normal heart sounds.  Pulmonary:     Effort: Pulmonary effort is normal.     Breath sounds: Normal breath sounds.  Chest:  Breasts:    Right: Skin change (UOQ bruise) and tenderness present. No inverted nipple or nipple discharge.  Musculoskeletal:     Right lower leg: No edema.     Left lower leg: No edema.  Lymphadenopathy:     Upper Body:     Right upper body: No supraclavicular or axillary adenopathy.     Left upper body: No supraclavicular or axillary adenopathy.  Neurological:     General: No focal deficit present.     Mental Status: She is alert and oriented to person, place, and  time.  Psychiatric:        Mood and Affect: Mood normal.        Behavior: Behavior normal.     Breast Exam Chaperone: Thana Ates    LABORATORY DATA:  I have reviewed the data as listed Recent Results (from the past 2160 hour(s))  TSH     Status: Abnormal   Collection Time: 11/25/21  9:37 AM  Result Value Ref Range   TSH 6.506 (H) 0.350 - 4.500 uIU/mL    Comment: Performed by a 3rd Generation assay with a functional sensitivity of <=0.01 uIU/mL. Performed at [facility], [address] [place], [place] [address]   Comprehensive metabolic panel     Status: Abnormal   Collection Time: 11/25/21  9:37 AM  Result Value Ref Range   Sodium 138 135 - 145 mmol/L   Potassium 4.1 3.5 - 5.1 mmol/L   Chloride 107 98 - 111 mmol/L   CO2 25 22 - 32 mmol/L   Glucose, Bld 111 (H) 70 - 99 mg/dL    Comment: Glucose reference range applies only to samples taken after fasting for at least 8 hours.   BUN 24 (H) 8 - 23 mg/dL   Creatinine, Ser 0.92 0.44 - 1.00 mg/dL   Calcium 9.3 8.9 - 10.3 mg/dL   Total Protein 7.2 6.5 - 8.1 g/dL   Albumin 3.9 3.5 - 5.0 g/dL   AST 25 15 - 41 U/L   ALT 20 0 - 44 U/L   Alkaline Phosphatase 69 38 - 126 U/L   Total Bilirubin 0.9 0.3 - 1.2 mg/dL   GFR, Estimated >60 >60 mL/min    Comment: (NOTE) Calculated using the CKD-EPI Creatinine Equation (2021)    Anion gap 6 5 - 15    Comment: Performed at [facility], [address], [place], [place] [address]  CBC with Differential     Status: Abnormal   Collection Time: 11/25/21  9:37 AM  Result Value Ref Range   WBC 3.5 (L) 4.0 - 10.5 K/uL   RBC 4.81 3.87 - 5.11 MIL/uL   Hemoglobin 13.8 12.0 - 15.0 g/dL   HCT 41.1 36.0 - 46.0 %   MCV 85.4 80.0 - 100.0 fL  MCH 28.7 26.0 - 34.0 pg   MCHC 33.6 30.0 - 36.0 g/dL   RDW 13.8 11.5 - 15.5 %   Platelets 156 150 - 400 K/uL   nRBC 0.0 0.0 - 0.2 %   Neutrophils Relative % 67 %   Neutro Abs 2.3 1.7 - 7.7 K/uL   Lymphocytes Relative 21 %   Lymphs Abs 0.7 0.7 - 4.0 K/uL    Monocytes Relative 9 %   Monocytes Absolute 0.3 0.1 - 1.0 K/uL   Eosinophils Relative 2 %   Eosinophils Absolute 0.1 0.0 - 0.5 K/uL   Basophils Relative 1 %   Basophils Absolute 0.0 0.0 - 0.1 K/uL   Immature Granulocytes 0 %   Abs Immature Granulocytes 0.01 0.00 - 0.07 K/uL    Comment: Performed at [facility], [address], [place], [place] [address]  Vitamin D 25 hydroxy     Status: None   Collection Time: 11/25/21  9:37 AM  Result Value Ref Range   Vit D, 25-Hydroxy 30.83 30 - 100 ng/mL    Comment: (NOTE) Vitamin D deficiency has been defined by the Institute of Medicine  and an Endocrine Society practice guideline as a level of serum 25-OH  vitamin D less than 20 ng/mL (1,2). The Endocrine Society went on to  further define vitamin D insufficiency as a level between 21 and 29  ng/mL (2).  1. IOM (Institute of Medicine). 2010. Dietary reference intakes for  calcium and D. [place]: The [employer]. 2. [place]ick MF, Binkley [place], Bischoff-Ferrari HA, et al. Evaluation,  treatment, and prevention of vitamin D deficiency: an Endocrine  Society clinical practice guideline, JCEM. 2011 Jul; 96(7): 1911-30.  Performed at [facility], [place] [address] [place], [place] [address]     RADIOGRAPHIC STUDIES: I have personally reviewed the radiological reports and agreed with the findings in the report. MM RT BREAST BX W LOC DEV 1ST LESION IMAGE BX SPEC STEREO GUIDE  Addendum Date: 11/18/2021   ADDENDUM REPORT: 11/18/2021 16:26 ADDENDUM: Pathology revealed Breast, RIGHT, needle core biopsy, upper outer quadrant calcifications (coil clip) : HIGH-GRADE DUCTAL CARCINOMA IN SITU, SOLID TYPE WITH COMEDONECROSIS. MICROCALCIFICATIONS PRESENT. This was found to be concordant by Dr. Abelardo Diesel. Pathology results were discussed with the patient by telephone. The patient reported doing well after the biopsy with tenderness at the site. Post biopsy instructions and care were  reviewed and questions were answered. The patient was encouraged to call [facility] for any additional concerns. Recommendations: 1-surgical referral. 2- Consider breast MRI given high grade histology. Per patient request, results and recommendations called/ faxed to Tommie RN with Dr. Sharlyne Cai NP of [facility] in [place], [place]. The patient requested assistance from her Provider for a surgical referral close to home. Pathology results reported by Stacie Acres RN on 11/18/2021. Electronically Signed   By: Abelardo Diesel M.D.   On: 11/18/2021 16:26   Result Date: 11/18/2021 CLINICAL DATA:  Right breast calcifications for biopsy EXAM: RIGHT BREAST STEREOTACTIC CORE NEEDLE BIOPSY COMPARISON:  Previous exam(s). FINDINGS: The patient and I discussed the procedure of stereotactic-guided biopsy including benefits and alternatives. We discussed the high likelihood of a successful procedure. We discussed the risks of the procedure including infection, bleeding, tissue injury, clip migration, and inadequate sampling. Informed written consent was given. The usual time out protocol was performed immediately prior to the procedure. Using sterile technique and 1% Lidocaine as local anesthetic, under stereotactic  guidance, a 9 gauge vacuum assisted device was used to perform core needle biopsy of calcifications in the upper-outer quadrant right breast using a lateral approach. Specimen radiograph was performed showing inclusion of calcifications of concern. Specimens with calcifications are identified for pathology. Lesion quadrant: Upper-outer quadrant At the conclusion of the procedure, coil shaped tissue marker clip was deployed into the biopsy cavity. Follow-up 2-view mammogram was performed and dictated separately. IMPRESSION: Stereotactic-guided biopsy of right breast. No apparent complications. Electronically Signed: By: Abelardo Diesel M.D. On: 11/17/2021 12:04  MM CLIP  PLACEMENT RIGHT  Result Date: 11/17/2021 CLINICAL DATA:  Right breast calcifications status post biopsy EXAM: 3D DIAGNOSTIC RIGHT MAMMOGRAM POST STEREOTACTIC BIOPSY COMPARISON:  Previous exam(s). FINDINGS: 3D Mammographic images were obtained following stereotactic guided biopsy of calcifications in the upper-outer quadrant right breast. The biopsy marking clip is in expected position at the site of biopsy. IMPRESSION: Appropriate positioning of the coil shaped biopsy marking clip at the site of biopsy in the expected location of concern. Final Assessment: Post Procedure Mammograms for Marker Placement Electronically Signed   By: Abelardo Diesel M.D.   On: 11/17/2021 12:09  US BREAST ASPIRATION RIGHT  Result Date: 11/17/2021 CLINICAL DATA:  Right  breast cyst for aspiration EXAM: ULTRASOUND GUIDED RIGHT BREAST CYST ASPIRATION COMPARISON:  Previous exam(s). PROCEDURE: The patient and I discussed the procedure of ultrasound-guided aspiration including benefits and alternatives. We discussed the high likelihood of a successful procedure. We discussed the risks of the procedure including infection, bleeding, tissue injury, and inadequate sampling. Informed written consent was given. The usual time out protocol was performed immediately prior to the procedure. Using sterile technique, 1% lidocaine, under direct ultrasound visualization, needle aspiration of group of cysts at right breast 11:30 o'clock was performed. 12 mL of yellow fluid aspirated. IMPRESSION: Ultrasound-guided aspiration of right breast 11:30 o'clock cluster of cysts. No apparent complications. RECOMMENDATIONS: Management on clinical basis for right breast cysts. Electronically Signed   By: Abelardo Diesel M.D.   On: 11/17/2021 11:02  MM DIAG BREAST TOMO BILATERAL  Result Date: 10/28/2021 CLINICAL DATA:  81 year old female with palpable areas of concern in each breast. History of numerous bilateral breast cyst excisions and aspirations. The  patient states palpable area of concern in her right breast is painful. EXAM: DIGITAL DIAGNOSTIC BILATERAL MAMMOGRAM WITH TOMOSYNTHESIS AND CAD; ULTRASOUND LEFT BREAST LIMITED; ULTRASOUND RIGHT BREAST LIMITED TECHNIQUE: Bilateral digital diagnostic mammography and breast tomosynthesis was performed. The images were evaluated with computer-aided detection.; Targeted ultrasound examination of the left breast was performed.; Targeted ultrasound examination of the right breast was performed COMPARISON:  Previous exam(s). ACR Breast Density Category c: The breast tissue is heterogeneously dense, which may obscure small masses. FINDINGS: Numerous oval and round circumscribed masses are again identified within the bilateral breast compatible with fluctuating cysts. Spot compression tangential tomograms were performed over the palpable area of concern in the right breast demonstrating a large cluster of oval circumscribed masses, all together measuring 4.5 cm. Spot compression tomograms were performed over the palpable area of concern in the left breast also demonstrating smaller oval circumscribed masses, 1 of which near the palpable marker measures 0.7 cm. Spot compression magnification views over the upper-outer posterior right breast demonstrate linear oriented new calcifications varying in shape size and density spanning 2.6 cm. A few of these calcifications may layer suggestive of milk of calcium. Targeted ultrasound of the right breast was performed demonstrating a cluster of large cysts at the 11:30 position 3 cm from nipple,  with a cyst measuring 2.4 x 1.5 x 2.2 cm. An additional large cyst measures 3 x 1.6 x 2.7 cm. Floating debris is identified within these cysts. A smaller cyst at the 11:30 position 3 cm from nipple measures 1.4 x 1.1 x 1.6 cm and a debris-filled cyst measures 0.6 x 0.6 x 0.6 cm. No lymphadenopathy seen in the right axilla. Targeted ultrasound of the left breast was performed demonstrating  several cysts in the region of palpable concern, with a cyst at 1 o'clock 2 cm from nipple measuring 1.7 x 0.9 x 1.6 cm. An additional cyst at 12 o'clock 2 cm from nipple measures 1.8 x 1 x 1.5 cm. IMPRESSION: 1.  Bilateral breast cysts. 2. Indeterminate 2.6 cm group of calcifications in the upper-outer posterior right breast. RECOMMENDATION: 1. Recommend stereotactic guided biopsy of the calcifications in the upper-outer posterior right breast. 2. Prior to stereotactic guided biopsy of the right breast calcifications, recommend aspiration of the largest painful cysts in the right breast at the 11:30 position 3 cm from nipple. I have discussed the findings and recommendations with the patient. If applicable, a reminder letter will be sent to the patient regarding the next appointment. BI-RADS CATEGORY  4: Suspicious. Electronically Signed   By: Everlean Alstrom M.D.   On: 10/28/2021 15:27  US BREAST LTD UNI LEFT INC AXILLA  Result Date: 10/28/2021 CLINICAL DATA:  81 year old female with palpable areas of concern in each breast. History of numerous bilateral breast cyst excisions and aspirations. The patient states palpable area of concern in her right breast is painful. EXAM: DIGITAL DIAGNOSTIC BILATERAL MAMMOGRAM WITH TOMOSYNTHESIS AND CAD; ULTRASOUND LEFT BREAST LIMITED; ULTRASOUND RIGHT BREAST LIMITED TECHNIQUE: Bilateral digital diagnostic mammography and breast tomosynthesis was performed. The images were evaluated with computer-aided detection.; Targeted ultrasound examination of the left breast was performed.; Targeted ultrasound examination of the right breast was performed COMPARISON:  Previous exam(s). ACR Breast Density Category c: The breast tissue is heterogeneously dense, which may obscure small masses. FINDINGS: Numerous oval and round circumscribed masses are again identified within the bilateral breast compatible with fluctuating cysts. Spot compression tangential tomograms were performed over  the palpable area of concern in the right breast demonstrating a large cluster of oval circumscribed masses, all together measuring 4.5 cm. Spot compression tomograms were performed over the palpable area of concern in the left breast also demonstrating smaller oval circumscribed masses, 1 of which near the palpable marker measures 0.7 cm. Spot compression magnification views over the upper-outer posterior right breast demonstrate linear oriented new calcifications varying in shape size and density spanning 2.6 cm. A few of these calcifications may layer suggestive of milk of calcium. Targeted ultrasound of the right breast was performed demonstrating a cluster of large cysts at the 11:30 position 3 cm from nipple, with a cyst measuring 2.4 x 1.5 x 2.2 cm. An additional large cyst measures 3 x 1.6 x 2.7 cm. Floating debris is identified within these cysts. A smaller cyst at the 11:30 position 3 cm from nipple measures 1.4 x 1.1 x 1.6 cm and a debris-filled cyst measures 0.6 x 0.6 x 0.6 cm. No lymphadenopathy seen in the right axilla. Targeted ultrasound of the left breast was performed demonstrating several cysts in the region of palpable concern, with a cyst at 1 o'clock 2 cm from nipple measuring 1.7 x 0.9 x 1.6 cm. An additional cyst at 12 o'clock 2 cm from nipple measures 1.8 x 1 x 1.5 cm. IMPRESSION: 1.  Bilateral breast  cysts. 2. Indeterminate 2.6 cm group of calcifications in the upper-outer posterior right breast. RECOMMENDATION: 1. Recommend stereotactic guided biopsy of the calcifications in the upper-outer posterior right breast. 2. Prior to stereotactic guided biopsy of the right breast calcifications, recommend aspiration of the largest painful cysts in the right breast at the 11:30 position 3 cm from nipple. I have discussed the findings and recommendations with the patient. If applicable, a reminder letter will be sent to the patient regarding the next appointment. BI-RADS CATEGORY  4: Suspicious.  Electronically Signed   By: Everlean Alstrom M.D.   On: 10/28/2021 15:27  US BREAST LTD UNI RIGHT INC AXILLA  Result Date: 10/28/2021 CLINICAL DATA:  81 year old female with palpable areas of concern in each breast. History of numerous bilateral breast cyst excisions and aspirations. The patient states palpable area of concern in her right breast is painful. EXAM: DIGITAL DIAGNOSTIC BILATERAL MAMMOGRAM WITH TOMOSYNTHESIS AND CAD; ULTRASOUND LEFT BREAST LIMITED; ULTRASOUND RIGHT BREAST LIMITED TECHNIQUE: Bilateral digital diagnostic mammography and breast tomosynthesis was performed. The images were evaluated with computer-aided detection.; Targeted ultrasound examination of the left breast was performed.; Targeted ultrasound examination of the right breast was performed COMPARISON:  Previous exam(s). ACR Breast Density Category c: The breast tissue is heterogeneously dense, which may obscure small masses. FINDINGS: Numerous oval and round circumscribed masses are again identified within the bilateral breast compatible with fluctuating cysts. Spot compression tangential tomograms were performed over the palpable area of concern in the right breast demonstrating a large cluster of oval circumscribed masses, all together measuring 4.5 cm. Spot compression tomograms were performed over the palpable area of concern in the left breast also demonstrating smaller oval circumscribed masses, 1 of which near the palpable marker measures 0.7 cm. Spot compression magnification views over the upper-outer posterior right breast demonstrate linear oriented new calcifications varying in shape size and density spanning 2.6 cm. A few of these calcifications may layer suggestive of milk of calcium. Targeted ultrasound of the right breast was performed demonstrating a cluster of large cysts at the 11:30 position 3 cm from nipple, with a cyst measuring 2.4 x 1.5 x 2.2 cm. An additional large cyst measures 3 x 1.6 x 2.7 cm. Floating  debris is identified within these cysts. A smaller cyst at the 11:30 position 3 cm from nipple measures 1.4 x 1.1 x 1.6 cm and a debris-filled cyst measures 0.6 x 0.6 x 0.6 cm. No lymphadenopathy seen in the right axilla. Targeted ultrasound of the left breast was performed demonstrating several cysts in the region of palpable concern, with a cyst at 1 o'clock 2 cm from nipple measuring 1.7 x 0.9 x 1.6 cm. An additional cyst at 12 o'clock 2 cm from nipple measures 1.8 x 1 x 1.5 cm. IMPRESSION: 1.  Bilateral breast cysts. 2. Indeterminate 2.6 cm group of calcifications in the upper-outer posterior right breast. RECOMMENDATION: 1. Recommend stereotactic guided biopsy of the calcifications in the upper-outer posterior right breast. 2. Prior to stereotactic guided biopsy of the right breast calcifications, recommend aspiration of the largest painful cysts in the right breast at the 11:30 position 3 cm from nipple. I have discussed the findings and recommendations with the patient. If applicable, a reminder letter will be sent to the patient regarding the next appointment. BI-RADS CATEGORY  4: Suspicious. Electronically Signed   By: Everlean Alstrom M.D.   On: 10/28/2021 15:27    ASSESSMENT:  Right breast DCIS, high-grade: - She felt a lump in the right  breast UOQ 2 months ago with some soreness. - Mammogram (10/28/2021) calcifications in the UOQ right breast. - Biopsy (11/17/2021): High-grade DCIS, solid type with comedonecrosis.  Negative for invasive carcinoma.  Microcalcification present.  DCIS measures 6 mm in greatest linear extent.  ER 60% positive and PR 1% positive. - She had history of fibrocystic disease and had previously cyst removals x3 which were benign.   Social/family history: - She lives at home by herself.  She is seen with her granddaughter.  She is independent of ADLs and IADLs.  She did construction/electrical work.  Quit smoking in 1984. - Maternal grandfather had lung cancer.  3.   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   PLAN:  Right breast DCIS, high-grade: - We discussed the pathology report in detail. - Recommend lumpectomy.  Surgery consultation will be requested. - We will obtain baseline bone density test.  We will also check vitamin D level. - RTC 4 weeks after surgery for follow-up. - Plan to initiate antiestrogen therapy for 5 years after surgery.  2.  Nutrition: - She is not able to eat any solid food since she completed chemoradiation in December 2015. - Continue Ensure supplements.    Derek Jack, MD 11/25/21 5:19 PM  [place] [phone]   I, Thana Ates, am acting as a scribe for Dr. Derek Jack.  I, Derek Jack MD, have reviewed the above documentation for accuracy and completeness, and I agree with the above.

## 2021-11-26 ENCOUNTER — Ambulatory Visit: Payer: Medicare Other | Admitting: General Surgery

## 2021-11-26 ENCOUNTER — Other Ambulatory Visit (HOSPITAL_COMMUNITY): Payer: Self-pay | Admitting: General Surgery

## 2021-11-26 ENCOUNTER — Encounter: Payer: Self-pay | Admitting: General Surgery

## 2021-11-26 VITALS — BP 150/90 | HR 81 | Temp 98.2°F | Resp 14 | Ht 67.5 in | Wt 182.0 lb

## 2021-11-26 DIAGNOSIS — D0511 Intraductal carcinoma in situ of right breast: Secondary | ICD-10-CM

## 2021-11-26 DIAGNOSIS — C50911 Malignant neoplasm of unspecified site of right female breast: Secondary | ICD-10-CM

## 2021-11-26 NOTE — Progress Notes (Unsigned)
Candace Lewis; [id number]; Oct 31, 1940   HPI Patient is a 81 year old white female who was referred to my care by Dr. Delton Coombes for a newly diagnosed right breast DCIS.  This was found on routine mammography.  Pathology reveals DCIS, ER/PR positive.  Patient has had a long history of fibrocystic disease of the breast with multiple surgeries bilaterally for cyst excision.  She has no family history of breast cancer.  She has been seeing Dr. Delton Coombes of oncology in the past for tongue cancer.  She received radiation therapy at that time.  She did feel a lump in that area of the breast. 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 Britta Mccreedy: LA Class B esophagitis. PEG tube in place. erythema in proximal esophagus.    ESOPHAGOGASTRODUODENOSCOPY (EGD) WITH PROPOFOL N/A 03/11/2017   Web in upper third of esophagus s/p dilation with scope passage, small hiatal hernia, normal duodenum, no specimens collected   FUDUCIAL PLACEMENT N/A 10/28/2015   Procedure: PLACEMENT OF FUDUCIAL TIMES THREE; LEFT UPPER LOBE BIOPSY;  Surgeon: Grace Isaac, MD;  Location: [facility];  Service: Thoracic;  Laterality: N/A;   MASTECTOMY, PARTIAL Left 1984   no cancer, fibrocystic breast disease   SKIN SURGERY  1960   moles removed from various areas per patient report    TONSILLECTOMY  1950   per patient report   [place]   vaginal hyst per patient report   VIDEO BRONCHOSCOPY WITH ENDOBRONCHIAL NAVIGATION N/A 10/28/2015   Procedure: VIDEO BRONCHOSCOPY WITH ENDOBRONCHIAL NAVIGATION;  Surgeon: Grace Isaac, MD;  Location: [facility];  Service: Thoracic;  Laterality: N/A;    Family History  Problem Relation Age of Onset   Heart disease Mother     Current Outpatient Medications on File Prior to Visit  Medication Sig Dispense Refill   levothyroxine (SYNTHROID) 88 MCG tablet Take 88 mcg by mouth daily before breakfast.     metoprolol tartrate (LOPRESSOR) 25 MG tablet Take 12.5 mg by mouth 2 (two) times daily.     omeprazole (PRILOSEC) 20 MG capsule Take 20 mg by mouth daily.     rosuvastatin (CRESTOR) 20 MG tablet Take 20 mg by mouth at bedtime.     No current facility-administered medications on file prior to visit.    Allergies  Allergen Reactions   Adhesive [Tape] Rash    Reports rash and blistering if any adhesive tape   Latex Rash    Rash and skin blistering per patient report    Social History   Substance and Sexual Activity  Alcohol Use No   Alcohol/week: 0.0 standard drinks of alcohol    Social History   Tobacco Use  Smoking  Status Former   Packs/day: 1.00   Years: 23.00   Total pack years: 23.00   Types: Cigarettes   Quit date: 05/03/1982   Years since quitting: 39.5  Smokeless Tobacco Never    Review of Systems  HENT:  Positive for sinus pain.   Eyes: Negative.   Respiratory: Negative.    Cardiovascular: Negative.   Gastrointestinal:  Positive for heartburn.  Genitourinary: Negative.   Musculoskeletal:  Positive for back pain, joint pain and neck pain.  Skin: Negative.   Neurological:  Positive for sensory change and headaches.  Endo/Heme/Allergies: Negative.   Psychiatric/Behavioral: Negative.      Objective   Vitals:   11/26/21 1343  BP: (!) 150/90  Pulse: 81  Resp:  14  Temp: 98.2 F (36.8 C)  SpO2: 97%    Physical Exam Vitals reviewed.  Constitutional:      Appearance: Normal appearance. She is not ill-appearing.  HENT:     Head: Normocephalic and atraumatic.  Cardiovascular:     Rate and Rhythm: Normal rate and regular rhythm.     Heart sounds: Normal heart sounds. No murmur heard.    No friction rub. No gallop.  Pulmonary:     Effort: Pulmonary effort is normal. No respiratory distress.     Breath sounds: Normal breath sounds. No stridor. No wheezing, rhonchi or rales.  Skin:    General: Skin is warm and dry.  Neurological:     Mental Status: She is alert and oriented to person, place, and time.   Breast: Multiple surgical scars noted in both breasts.  Bruising is noted in the upper, outer quadrant of the right breast.  No nipple discharge or dimpling noted.  Axilla negative for palpable nodes.  Left breast examination reveals no significant dominant mass, nipple discharge, or dimpling.  The axilla is negative for palpable nodes.  Mammography, ultrasound, and pathology reports reviewed  Assessment  DCIS, right breast Plan  Patient is scheduled for a right partial mastectomy after radiofrequency tag placement on 12/15/2021.  The risks and benefits of the procedure including bleeding, infection, and the possibility of needing further resection due to unclear margins were fully explained to the patient, who gave informed consent.  In discussion with Dr. Delton Coombes, the patient will receive antiestrogen medications.  Given her age and previous radiation treatment, she will not need radiation therapy.

## 2021-11-27 NOTE — H&P (Signed)
Candace Lewis; [id number]; [birth year]   HPI Patient is a 81 year old white female who was referred to my care by Dr. Delton Coombes for a newly diagnosed right breast DCIS.  This was found on routine mammography.  Pathology reveals DCIS, ER/PR positive.  Patient has had a long history of fibrocystic disease of the breast with multiple surgeries bilaterally for cyst excision.  She has no family history of breast cancer.  She has been seeing Dr. Delton Coombes of oncology in the past for tongue cancer.  She received radiation therapy at that time.  She did feel a lump in that area of the breast. 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 Britta Mccreedy: LA Class B esophagitis. PEG tube in place. erythema in proximal esophagus.    ESOPHAGOGASTRODUODENOSCOPY (EGD) WITH PROPOFOL N/A 03/11/2017   Web in upper third of esophagus s/p dilation with scope passage, small hiatal hernia, normal duodenum, no specimens collected   FUDUCIAL PLACEMENT N/A 10/28/2015   Procedure: PLACEMENT OF FUDUCIAL TIMES THREE; LEFT UPPER LOBE BIOPSY;  Surgeon: Grace Isaac, MD;  Location: [facility];  Service: Thoracic;  Laterality: N/A;   MASTECTOMY, PARTIAL Left 1984   no cancer, fibrocystic breast disease   SKIN SURGERY  1960   moles removed from various areas per patient report    TONSILLECTOMY  1950   per patient report   [place]   vaginal hyst per patient report   VIDEO BRONCHOSCOPY WITH ENDOBRONCHIAL NAVIGATION N/A 10/28/2015   Procedure: VIDEO BRONCHOSCOPY WITH ENDOBRONCHIAL NAVIGATION;  Surgeon: Grace Isaac, MD;  Location: [facility];  Service: Thoracic;  Laterality: N/A;    Family History  Problem Relation Age of Onset   Heart disease Mother     Current Outpatient Medications on File Prior to Visit  Medication Sig Dispense Refill   levothyroxine (SYNTHROID) 88 MCG tablet Take 88 mcg by mouth daily before breakfast.     metoprolol tartrate (LOPRESSOR) 25 MG tablet Take 12.5 mg by mouth 2 (two) times daily.     omeprazole (PRILOSEC) 20 MG capsule Take 20 mg by mouth daily.     rosuvastatin (CRESTOR) 20 MG tablet Take 20 mg by mouth at bedtime.     No current facility-administered medications on file prior to visit.    Allergies  Allergen Reactions   Adhesive [Tape] Rash    Reports rash and blistering if any adhesive tape   Latex Rash    Rash and skin blistering per patient report    Social History   Substance and Sexual Activity  Alcohol Use No   Alcohol/week: 0.0 standard drinks of alcohol    Social History   Tobacco Use  Smoking  Status Former   Packs/day: 1.00   Years: 23.00   Total pack years: 23.00   Types: Cigarettes   Quit date: 05/03/1982   Years since quitting: 39.5  Smokeless Tobacco Never    Review of Systems  HENT:  Positive for sinus pain.   Eyes: Negative.   Respiratory: Negative.    Cardiovascular: Negative.   Gastrointestinal:  Positive for heartburn.  Genitourinary: Negative.   Musculoskeletal:  Positive for back pain, joint pain and neck pain.  Skin: Negative.   Neurological:  Positive for sensory change and headaches.  Endo/Heme/Allergies: Negative.   Psychiatric/Behavioral: Negative.      Objective   Vitals:   11/26/21 1343  BP: (!) 150/90  Pulse: 81  Resp:  14  Temp: 98.2 F (36.8 C)  SpO2: 97%    Physical Exam Vitals reviewed.  Constitutional:      Appearance: Normal appearance. She is not ill-appearing.  HENT:     Head: Normocephalic and atraumatic.  Cardiovascular:     Rate and Rhythm: Normal rate and regular rhythm.     Heart sounds: Normal heart sounds. No murmur heard.    No friction rub. No gallop.  Pulmonary:     Effort: Pulmonary effort is normal. No respiratory distress.     Breath sounds: Normal breath sounds. No stridor. No wheezing, rhonchi or rales.  Skin:    General: Skin is warm and dry.  Neurological:     Mental Status: She is alert and oriented to person, place, and time.   Breast: Multiple surgical scars noted in both breasts.  Bruising is noted in the upper, outer quadrant of the right breast.  No nipple discharge or dimpling noted.  Axilla negative for palpable nodes.  Left breast examination reveals no significant dominant mass, nipple discharge, or dimpling.  The axilla is negative for palpable nodes.  Mammography, ultrasound, and pathology reports reviewed  Assessment  DCIS, right breast Plan  Patient is scheduled for a right partial mastectomy after radiofrequency tag placement on 12/15/2021.  The risks and benefits of the procedure including bleeding, infection, and the possibility of needing further resection due to unclear margins were fully explained to the patient, who gave informed consent.  In discussion with Dr. Delton Coombes, the patient will receive antiestrogen medications.  Given her age and previous radiation treatment, she will not need radiation therapy.

## 2021-12-09 ENCOUNTER — Ambulatory Visit (HOSPITAL_COMMUNITY)
Admission: RE | Admit: 2021-12-09 | Discharge: 2021-12-09 | Disposition: A | Discharge status: Home / Self Care | Payer: Medicare Other | Source: Ambulatory Visit | Attending: General Surgery | Admitting: General Surgery

## 2021-12-09 DIAGNOSIS — C50911 Malignant neoplasm of unspecified site of right female breast: Secondary | ICD-10-CM | POA: Insufficient documentation

## 2021-12-09 MED ORDER — LIDOCAINE HCL (PF) 2 % IJ SOLN
INTRAMUSCULAR | Status: AC
  Filled 2021-12-09: qty 10

## 2021-12-10 ENCOUNTER — Other Ambulatory Visit (HOSPITAL_COMMUNITY): Payer: Medicare Other

## 2021-12-11 ENCOUNTER — Other Ambulatory Visit: Payer: Self-pay

## 2021-12-11 ENCOUNTER — Encounter (HOSPITAL_COMMUNITY)
Admission: RE | Admit: 2021-12-11 | Discharge: 2021-12-11 | Disposition: A | Discharge status: Home / Self Care | Payer: Medicare Other | Source: Ambulatory Visit | Attending: General Surgery | Admitting: General Surgery

## 2021-12-11 ENCOUNTER — Encounter (HOSPITAL_COMMUNITY): Payer: Self-pay

## 2021-12-12 ENCOUNTER — Other Ambulatory Visit (HOSPITAL_COMMUNITY)
Admission: RE | Admit: 2021-12-12 | Discharge: 2021-12-12 | Disposition: A | Discharge status: Home / Self Care | Payer: Medicare Other | Source: Ambulatory Visit | Attending: General Surgery | Admitting: General Surgery

## 2021-12-12 ENCOUNTER — Ambulatory Visit (HOSPITAL_COMMUNITY)
Admission: RE | Admit: 2021-12-12 | Discharge: 2021-12-12 | Disposition: A | Discharge status: Home / Self Care | Payer: Medicare Other | Source: Ambulatory Visit | Attending: General Surgery | Admitting: General Surgery

## 2021-12-12 DIAGNOSIS — D0511 Intraductal carcinoma in situ of right breast: Secondary | ICD-10-CM

## 2021-12-12 DIAGNOSIS — I951 Orthostatic hypotension: Secondary | ICD-10-CM

## 2021-12-15 ENCOUNTER — Encounter (HOSPITAL_COMMUNITY): Payer: Self-pay | Admitting: General Surgery

## 2021-12-15 ENCOUNTER — Ambulatory Visit (HOSPITAL_COMMUNITY): Payer: Medicare Other

## 2021-12-15 ENCOUNTER — Ambulatory Visit (HOSPITAL_COMMUNITY)
Admission: RE | Admit: 2021-12-15 | Discharge: 2021-12-15 | Disposition: A | Discharge status: Home / Self Care | Payer: Medicare Other | Attending: General Surgery | Admitting: General Surgery

## 2021-12-15 ENCOUNTER — Ambulatory Visit (HOSPITAL_COMMUNITY): Payer: Medicare Other | Admitting: Anesthesiology

## 2021-12-15 ENCOUNTER — Ambulatory Visit (HOSPITAL_BASED_OUTPATIENT_CLINIC_OR_DEPARTMENT_OTHER): Payer: Medicare Other | Admitting: Anesthesiology

## 2021-12-15 ENCOUNTER — Encounter (HOSPITAL_COMMUNITY)
Admission: RE | Disposition: A | Discharge status: Home / Self Care | Payer: Self-pay | Source: Home / Self Care | Attending: General Surgery

## 2021-12-15 ENCOUNTER — Other Ambulatory Visit: Payer: Self-pay

## 2021-12-15 DIAGNOSIS — Z8501 Personal history of malignant neoplasm of esophagus: Secondary | ICD-10-CM | POA: Insufficient documentation

## 2021-12-15 DIAGNOSIS — E039 Hypothyroidism, unspecified: Secondary | ICD-10-CM | POA: Insufficient documentation

## 2021-12-15 DIAGNOSIS — N6011 Diffuse cystic mastopathy of right breast: Secondary | ICD-10-CM | POA: Diagnosis not present

## 2021-12-15 DIAGNOSIS — Z8521 Personal history of malignant neoplasm of larynx: Secondary | ICD-10-CM | POA: Diagnosis not present

## 2021-12-15 DIAGNOSIS — Z87891 Personal history of nicotine dependence: Secondary | ICD-10-CM

## 2021-12-15 DIAGNOSIS — R928 Other abnormal and inconclusive findings on diagnostic imaging of breast: Secondary | ICD-10-CM

## 2021-12-15 DIAGNOSIS — Z923 Personal history of irradiation: Secondary | ICD-10-CM | POA: Diagnosis not present

## 2021-12-15 DIAGNOSIS — I10 Essential (primary) hypertension: Secondary | ICD-10-CM | POA: Insufficient documentation

## 2021-12-15 DIAGNOSIS — K219 Gastro-esophageal reflux disease without esophagitis: Secondary | ICD-10-CM | POA: Diagnosis not present

## 2021-12-15 DIAGNOSIS — D0511 Intraductal carcinoma in situ of right breast: Secondary | ICD-10-CM | POA: Diagnosis present

## 2021-12-15 DIAGNOSIS — Z85810 Personal history of malignant neoplasm of tongue: Secondary | ICD-10-CM | POA: Insufficient documentation

## 2021-12-15 DIAGNOSIS — Z170 Estrogen receptor positive status [ER+]: Secondary | ICD-10-CM | POA: Diagnosis not present

## 2021-12-15 SURGERY — PARTIAL MASTECTOMY WITH RADIO FREQUENCY LOCALIZER
Anesthesia: General | Site: Breast | Laterality: Right

## 2021-12-15 MED ORDER — 0.9 % SODIUM CHLORIDE (POUR BTL) OPTIME
TOPICAL | Status: DC | PRN
  Administered 2021-12-15: 1000 mL

## 2021-12-15 MED ORDER — EPHEDRINE 5 MG/ML INJ
INTRAVENOUS | Status: AC
  Filled 2021-12-15: qty 5

## 2021-12-15 MED ORDER — PHENYLEPHRINE HCL (PRESSORS) 10 MG/ML IV SOLN
INTRAVENOUS | Status: AC
  Filled 2021-12-15: qty 1

## 2021-12-15 MED ORDER — FENTANYL CITRATE (PF) 100 MCG/2ML IJ SOLN
INTRAMUSCULAR | Status: AC
  Filled 2021-12-15: qty 2

## 2021-12-15 MED ORDER — ONDANSETRON HCL 4 MG/2ML IJ SOLN
INTRAMUSCULAR | Status: AC
  Filled 2021-12-15: qty 2

## 2021-12-15 MED ORDER — DEXAMETHASONE SODIUM PHOSPHATE 10 MG/ML IJ SOLN
INTRAMUSCULAR | Status: DC | PRN
  Administered 2021-12-15: 10 mg via INTRAVENOUS

## 2021-12-15 MED ORDER — SUCCINYLCHOLINE CHLORIDE 200 MG/10ML IV SOSY
PREFILLED_SYRINGE | INTRAVENOUS | Status: AC
  Filled 2021-12-15: qty 10

## 2021-12-15 MED ORDER — DEXAMETHASONE SODIUM PHOSPHATE 10 MG/ML IJ SOLN
INTRAMUSCULAR | Status: AC
  Filled 2021-12-15: qty 1

## 2021-12-15 MED ORDER — PHENYLEPHRINE 80 MCG/ML (10ML) SYRINGE FOR IV PUSH (FOR BLOOD PRESSURE SUPPORT)
PREFILLED_SYRINGE | INTRAVENOUS | Status: AC
  Filled 2021-12-15: qty 10

## 2021-12-15 MED ORDER — ORAL CARE MOUTH RINSE: 15.0000 mL | Freq: Once | OROMUCOSAL | Status: AC

## 2021-12-15 MED ORDER — BUPIVACAINE LIPOSOME 1.3 % IJ SUSP
INTRAMUSCULAR | Status: AC
  Filled 2021-12-15: qty 20

## 2021-12-15 MED ORDER — LIDOCAINE HCL (PF) 2 % IJ SOLN
INTRAMUSCULAR | Status: AC
  Filled 2021-12-15: qty 5

## 2021-12-15 MED ORDER — PHENYLEPHRINE HCL-NACL 20-0.9 MG/250ML-% IV SOLN
INTRAVENOUS | Status: DC | PRN
  Administered 2021-12-15: 25 ug/min via INTRAVENOUS

## 2021-12-15 MED ORDER — CHLORHEXIDINE GLUCONATE CLOTH 2 % EX PADS: 6.0000 | MEDICATED_PAD | Freq: Once | CUTANEOUS | Status: DC

## 2021-12-15 MED ORDER — PROPOFOL 10 MG/ML IV BOLUS
INTRAVENOUS | Status: DC | PRN
  Administered 2021-12-15: 150 mg via INTRAVENOUS
  Administered 2021-12-15: 20 mg via INTRAVENOUS
  Administered 2021-12-15: 30 mg via INTRAVENOUS

## 2021-12-15 MED ORDER — LACTATED RINGERS IV SOLN
INTRAVENOUS | Status: DC
Start: 2021-12-15 — End: 2021-12-15
  Administered 2021-12-15: 1000 mL via INTRAVENOUS

## 2021-12-15 MED ORDER — FENTANYL CITRATE (PF) 100 MCG/2ML IJ SOLN
INTRAMUSCULAR | Status: DC | PRN
Start: 2021-12-15 — End: 2021-12-15
  Administered 2021-12-15 (×2): 50 ug via INTRAVENOUS
  Administered 2021-12-15: 25 ug via INTRAVENOUS

## 2021-12-15 MED ORDER — ONDANSETRON HCL 4 MG/2ML IJ SOLN: 4.0000 mg | Freq: Once | INTRAMUSCULAR | Status: DC | PRN

## 2021-12-15 MED ORDER — EPHEDRINE SULFATE-NACL 50-0.9 MG/10ML-% IV SOSY
PREFILLED_SYRINGE | INTRAVENOUS | Status: DC | PRN
  Administered 2021-12-15: 5 mg via INTRAVENOUS
  Administered 2021-12-15: 10 mg via INTRAVENOUS
  Administered 2021-12-15: 5 mg via INTRAVENOUS

## 2021-12-15 MED ORDER — CHLORHEXIDINE GLUCONATE 0.12 % MT SOLN
15.0000 mL | Freq: Once | OROMUCOSAL | Status: AC
  Administered 2021-12-15: 15 mL via OROMUCOSAL

## 2021-12-15 MED ORDER — BUPIVACAINE LIPOSOME 1.3 % IJ SUSP
INTRAMUSCULAR | Status: DC | PRN
  Administered 2021-12-15: 20 mL

## 2021-12-15 MED ORDER — ONDANSETRON HCL 4 MG/2ML IJ SOLN
INTRAMUSCULAR | Status: DC | PRN
  Administered 2021-12-15: 4 mg via INTRAVENOUS

## 2021-12-15 MED ORDER — LIDOCAINE HCL (CARDIAC) PF 100 MG/5ML IV SOSY
PREFILLED_SYRINGE | INTRAVENOUS | Status: DC | PRN
  Administered 2021-12-15: 40 mg via INTRAVENOUS

## 2021-12-15 MED ORDER — METOCLOPRAMIDE HCL 5 MG/ML IJ SOLN
10.0000 mg | Freq: Once | INTRAMUSCULAR | Status: AC
  Administered 2021-12-15: 10 mg via INTRAVENOUS
  Filled 2021-12-15: qty 2

## 2021-12-15 MED ORDER — HYDROMORPHONE HCL 1 MG/ML IJ SOLN
0.2500 mg | INTRAMUSCULAR | Status: DC | PRN
  Administered 2021-12-15: 0.5 mg via INTRAVENOUS
  Filled 2021-12-15: qty 0.5

## 2021-12-15 MED ORDER — PHENYLEPHRINE HCL (PRESSORS) 10 MG/ML IV SOLN
INTRAVENOUS | Status: DC | PRN
  Administered 2021-12-15: 160 ug via INTRAVENOUS
  Administered 2021-12-15: 80 ug via INTRAVENOUS
  Administered 2021-12-15: 160 ug via INTRAVENOUS
  Administered 2021-12-15: 120 ug via INTRAVENOUS

## 2021-12-15 MED ORDER — SUCCINYLCHOLINE CHLORIDE 200 MG/10ML IV SOSY
PREFILLED_SYRINGE | INTRAVENOUS | Status: DC | PRN
  Administered 2021-12-15: 100 mg via INTRAVENOUS

## 2021-12-15 SURGICAL SUPPLY — 28 items
ADH SKN CLS APL DERMABOND .7 (GAUZE/BANDAGES/DRESSINGS) ×1
CLOTH BEACON ORANGE TIMEOUT ST (SAFETY) ×2 IMPLANT
COVER LIGHT HANDLE STERIS (MISCELLANEOUS) ×4 IMPLANT
DERMABOND ADVANCED (GAUZE/BANDAGES/DRESSINGS) ×1
DERMABOND ADVANCED .7 DNX12 (GAUZE/BANDAGES/DRESSINGS) IMPLANT
DURAPREP 26ML APPLICATOR (WOUND CARE) ×2 IMPLANT
ELECT REM PT RETURN 9FT ADLT (ELECTROSURGICAL) ×2
ELECTRODE REM PT RTRN 9FT ADLT (ELECTROSURGICAL) ×1 IMPLANT
GLOVE BIOGEL PI IND STRL 7.0 (GLOVE) ×2 IMPLANT
GLOVE BIOGEL PI INDICATOR 7.0 (GLOVE) ×2
GLOVE SURG SS PI 7.5 STRL IVOR (GLOVE) ×2 IMPLANT
GOWN STRL REUS W/TWL LRG LVL3 (GOWN DISPOSABLE) ×4 IMPLANT
KIT TURNOVER KIT A (KITS) ×2 IMPLANT
MANIFOLD NEPTUNE II (INSTRUMENTS) ×2 IMPLANT
NDL HYPO 21X1.5 SAFETY (NEEDLE) IMPLANT
NEEDLE HYPO 21X1.5 SAFETY (NEEDLE) ×2 IMPLANT
NS IRRIG 1000ML POUR BTL (IV SOLUTION) ×2 IMPLANT
PACK MINOR (CUSTOM PROCEDURE TRAY) ×2 IMPLANT
PAD ARMBOARD 7.5X6 YLW CONV (MISCELLANEOUS) ×2 IMPLANT
SET BASIN LINEN APH (SET/KITS/TRAYS/PACK) ×2 IMPLANT
SET LOCALIZER 20 PROBE US (MISCELLANEOUS) ×2 IMPLANT
SPONGE T-LAP 18X18 ~~LOC~~+RFID (SPONGE) ×2 IMPLANT
SUT MNCRL AB 4-0 PS2 18 (SUTURE) ×2 IMPLANT
SUT SILK 3 0 (SUTURE) ×2
SUT SILK 3-0 FS1 18XBRD (SUTURE) ×1 IMPLANT
SUT VIC AB 3-0 SH 27 (SUTURE) ×2
SUT VIC AB 3-0 SH 27X BRD (SUTURE) ×1 IMPLANT
SYR 20ML LL LF (SYRINGE) ×3 IMPLANT

## 2021-12-15 NOTE — Interval H&P Note (Signed)
History and Physical Interval Note:  12/15/2021 7:16 AM  Candace Lewis  has presented today for surgery, with the diagnosis of RIGHT BREAST DCIS.  The various methods of treatment have been discussed with the patient and family. After consideration of risks, benefits and other options for treatment, the patient has consented to  Procedure(s): PARTIAL MASTECTOMY WITH RADIO FREQUENCY LOCALIZER (Right) as a surgical intervention.  The patient's history has been reviewed, patient examined, no change in status, stable for surgery.  I have reviewed the patient's chart and labs.  Questions were answered to the patient's satisfaction.     Aviva Signs

## 2021-12-15 NOTE — Transfer of Care (Signed)
Immediate Anesthesia Transfer of Care Note  Patient: Candace Lewis  Procedure(s) Performed: PARTIAL MASTECTOMY WITH RADIO FREQUENCY LOCALIZER (Right: Breast)  Patient Location: PACU  Anesthesia Type:General  Level of Consciousness: awake, alert  and patient cooperative  Airway & Oxygen Therapy: Patient Spontanous Breathing and Patient connected to nasal cannula oxygen  Post-op Assessment: Report given to RN and Post -op Vital signs reviewed and stable  Post vital signs: Reviewed and stable  Last Vitals:  Vitals Value Taken Time  BP 123/91 12/15/21 0839  Temp 97.9   Pulse 87 12/15/21 0840  Resp 17 12/15/21 0840  SpO2 99 % 12/15/21 0840  Vitals shown include unvalidated device data.  Last Pain:  Vitals:   12/15/21 0650  TempSrc: Oral  PainSc: 0-No pain      Patients Stated Pain Goal: 6 (54/36/06 7703)  Complications:  Encounter Notable Events  Notable Event Outcome Phase Comment  Difficult to intubate - expected  Intraprocedure Filed from anesthesia note documentation.

## 2021-12-15 NOTE — Anesthesia Procedure Notes (Signed)
Procedure Name: Intubation Date/Time: 12/15/2021 7:34 AM  Performed by: Eulas Post, Anis Degidio W, CRNAPre-anesthesia Checklist: Patient identified, Emergency Drugs available, Suction available and Patient being monitored Patient Re-evaluated:Patient Re-evaluated prior to induction Oxygen Delivery Method: Circle system utilized Preoxygenation: Pre-oxygenation with 100% oxygen Induction Type: IV induction Laryngoscope Size: Glidescope Tube type: Oral Tube size: 6.5 mm Number of attempts: 1 Airway Equipment and Method: Stylet and Video-laryngoscopy Placement Confirmation: ETT inserted through vocal cords under direct vision, positive ETCO2 and breath sounds checked- equal and bilateral Secured at: 21 cm Tube secured with: Tape Dental Injury: Teeth and Oropharynx as per pre-operative assessment  Difficulty Due To: Difficulty was anticipated, Difficult Airway- due to immobile epiglottis, Difficult Airway- due to anterior larynx, Difficult Airway- due to limited oral opening and Difficult Airway- due to reduced neck mobility Comments: Elective glide due to fixed airway. Grade one view. 6.5 passes with ease, little bleeding suctioned. Sats 97-100%. MDA at bedside. =BBS +EtCO2. VSS

## 2021-12-15 NOTE — Anesthesia Postprocedure Evaluation (Signed)
Anesthesia Post Note  Patient: [name]  Procedure(s) Performed: PARTIAL MASTECTOMY WITH RADIO FREQUENCY LOCALIZER (Right: Breast)  Patient location during evaluation: Phase II Anesthesia Type: General Level of consciousness: awake and alert and oriented Pain management: pain level controlled Vital Signs Assessment: post-procedure vital signs reviewed and stable Respiratory status: spontaneous breathing, nonlabored ventilation and respiratory function stable Cardiovascular status: blood pressure returned to baseline and stable Postop Assessment: no apparent nausea or vomiting Anesthetic complications: yes   Encounter Notable Events  Notable Event Outcome Phase Comment  Difficult to intubate - expected  Intraprocedure Filed from anesthesia note documentation.     Last Vitals:  Vitals:   12/15/21 0915 12/15/21 0950  BP: 137/70 (!) 140/64  Pulse: 78 81  Resp: 13 14  Temp:  36.4 C  SpO2: 96% 100%    Last Pain:  Vitals:   12/15/21 0950  TempSrc: Oral  PainSc: 2                  Ilka Lovick C Barnaby Rippeon

## 2021-12-15 NOTE — Op Note (Signed)
Patient:  Candace Lewis  DOB:  05-29-1940  MRN:  [id number]   Preop Diagnosis: Ductal carcinoma in situ, right breast  Postop Diagnosis: Same  Procedure: Right partial mastectomy  Surgeon: Aviva Signs, MD  Anes: General endotracheal  Indications: Patient is a 81 year old white female who was recently diagnosed with ductal carcinoma in situ of the right breast.  She now presents for right partial mastectomy.  The risks and benefits of the procedure including bleeding, infection, and unclear margins were fully explained to the patient, who gave informed consent.  Procedure note: The patient was placed in the supine position.  After induction of general endotracheal anesthesia, the right breast was prepped and draped using the usual sterile technique with ChloraPrep.  Surgical site confirmation was performed.  Using the radiofrequency tag Hologic localizer, an incision was made in the upper, outer quadrant of the right breast.  The dissection was taken down to the chest wall.  A short suture was placed superiorly and a long suture placed laterally for orientation purposes.  The specimen was removed and sent to mammography.  Specimen radiography revealed the cancer, clip, and radiofrequency tag within the specimen removed.  It was then sent to pathology for further examination.  A bleeding was controlled using Bovie electrocautery.  The wound was irrigated with normal saline.  Exparel was instilled into the surrounding wound.  The subcutaneous layer was reapproximated using a 3-0 Vicryl interrupted suture.  The skin was closed using a 4-0 Monocryl subcuticular suture.  Dermabond was applied.  All tape and needle counts were correct at the end of the procedure.  The patient was extubated in the operating room and transferred to PACU in stable condition.  Complications: None  EBL: Minimal  Specimen: Right breast tissue

## 2021-12-15 NOTE — Anesthesia Preprocedure Evaluation (Addendum)
Anesthesia Evaluation  Patient identified by MRN, date of birth, ID band Patient awake    Reviewed: Allergy & Precautions, NPO status , Patient's Chart, lab work & pertinent test results, reviewed documented beta blocker date and time   Airway Mallampati: IV   Neck ROM: Full  Mouth opening: Limited Mouth Opening Comment: Esophageal cancer, tongue cancer, throat cancer Dental  (+) Edentulous Upper, Edentulous Lower   Pulmonary neg pulmonary ROS, former smoker,    Pulmonary exam normal breath sounds clear to auscultation       Cardiovascular Exercise Tolerance: Good hypertension, Pt. on medications and Pt. on home beta blockers Normal cardiovascular exam Rhythm:Regular Rate:Normal     Neuro/Psych  Headaches, negative psych ROS   GI/Hepatic Neg liver ROS, GERD  Medicated, Controlled and Poorly Controlled,Esophageal cancer, tongue cancer, throat cancer   Endo/Other  Hypothyroidism   Renal/GU negative Renal ROS  negative genitourinary   Musculoskeletal  (+) Arthritis ,   Abdominal   Peds negative pediatric ROS (+)  Hematology negative hematology ROS (+)   Anesthesia Other Findings Esophageal cancer, tongue cancer, throat cancer  Reproductive/Obstetrics negative OB ROS                            Anesthesia Physical Anesthesia Plan  ASA: 3  Anesthesia Plan: General   Post-op Pain Management: Dilaudid IV   Induction: Intravenous  PONV Risk Score and Plan: 3 and Ondansetron, Dexamethasone and Metaclopromide  Airway Management Planned: Oral ETT and Video Laryngoscope Planned  Additional Equipment:   Intra-op Plan:   Post-operative Plan:   Informed Consent: I have reviewed the patients History and Physical, chart, labs and discussed the procedure including the risks, benefits and alternatives for the proposed anesthesia with the patient or authorized representative who has indicated  his/her understanding and acceptance.     Dental advisory given  Plan Discussed with: CRNA and Surgeon  Anesthesia Plan Comments: (Pre op reglan '10mg'$  iv, will intubate while patient breathing spontaneously under iv sedation. )      Anesthesia Quick Evaluation

## 2021-12-17 ENCOUNTER — Ambulatory Visit (HOSPITAL_COMMUNITY): Payer: Medicare Other | Admitting: Hematology

## 2021-12-18 LAB — SURGICAL PATHOLOGY

## 2021-12-23 ENCOUNTER — Encounter: Payer: Self-pay | Admitting: General Surgery

## 2021-12-23 ENCOUNTER — Ambulatory Visit (INDEPENDENT_AMBULATORY_CARE_PROVIDER_SITE_OTHER): Payer: Medicare Other | Admitting: General Surgery

## 2021-12-23 VITALS — BP 118/81 | HR 119 | Temp 98.0°F | Resp 20 | Ht 67.5 in | Wt 182.0 lb

## 2021-12-23 DIAGNOSIS — Z09 Encounter for follow-up examination after completed treatment for conditions other than malignant neoplasm: Secondary | ICD-10-CM

## 2021-12-23 DIAGNOSIS — R5383 Other fatigue: Secondary | ICD-10-CM | POA: Insufficient documentation

## 2021-12-23 DIAGNOSIS — I10 Essential (primary) hypertension: Secondary | ICD-10-CM | POA: Insufficient documentation

## 2021-12-23 DIAGNOSIS — E079 Disorder of thyroid, unspecified: Secondary | ICD-10-CM | POA: Insufficient documentation

## 2021-12-23 DIAGNOSIS — E785 Hyperlipidemia, unspecified: Secondary | ICD-10-CM | POA: Insufficient documentation

## 2021-12-23 NOTE — Progress Notes (Signed)
Subjective:     Candace Lewis  Patient here for postoperative visit, status post right partial mastectomy.  She states she is doing remarkably well.  She does have some pain when she extends her arm that seems to be coming from her incision and chest wall.  She denies any fever or chills.  She has had no drainage from her wound. Objective:    BP 118/81   Pulse (!) 119   Temp 98 F (36.7 C) (Oral)   Resp 20   Ht 5' 7.5" (1.715 m)   Wt 182 lb (82.6 kg)   SpO2 97%   BMI 28.08 kg/m   General:  alert, cooperative, and no distress  Right breast incision well-healed.  No hematoma or ecchymosis present. 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 BREAST, RIGHT, LUMPECTOMY:  - Focus of microinvasive carcinoma (less than 1 mm) arising in a  background of high-grade ductal carcinoma in situ with necrosis and  calcifications  - Resection margins are negative for invasive carcinoma  - DCIS is focally less than 1 mm from posterior margin  - Fibrocystic change with calcifications  - Biopsy site changes  - See oncology table       ONCOLOGY TABLE:   Procedure: Lumpectomy  Specimen Laterality: Right  Histologic Type: Focus of microinvasive ductal carcinoma  Histologic Grade:       Glandular (Acinar)/Tubular Differentiation: 3       Nuclear Pleomorphism: 3       Mitotic Rate: 1       Overall Grade: 2  Tumor Size: Less than 1 mm  Ductal Carcinoma In Situ: Present, high-grade with  necrosis and  calcifications  Treatment Effect in the Breast: No known presurgical therapy  Margins: All margins negative for invasive carcinoma       Distance from Closest Margin (mm): Cannot be determined  DCIS Margins: Uninvolved by DCIS       Distance from Closest Margin (mm): Less than 1 mm, posterior margin  Regional Lymph Nodes: Not applicable (no lymph nodes submitted or found)  Distant Metastasis:       Distant Site(s) Involved: Not applicable  Breast Biomarker Testing Performed on Previous Biopsy:       Testing Performed on Case Number: [id number]             Estrogen Receptor: 60%, positive, strong staining intensity             Progesterone Receptor: 1%, positive, strong staining  intensity  Pathologic Stage Classification (pTNM, AJCC 8th Edition): pT3m, pN not  assigned  Representative Tumor Block: A1  Comment(s): None   (v4.5.0.0)     GROSS DESCRIPTION:   Specimen type: Right breast lumpectomy, placed in formalin at 0830 hrs  on 12/15/2021.  Size: 8 cm from superior to inferior, 7.4 cm from medial to lateral,  ranges from 3 to 3.8 cm from anterior to posterior.  Orientation: There is a short suture  at superior and long suture  lateral.  The specimen is inked as follows: Green Anterior, Blue  Inferior, Orange Lateral, Yellow Medial, Black Posterior, Red Superior.  Localized area: None  Cut surface: An RF device is found.  The specimen consists of equal  portions of soft fatty tissue, and gray-white soft to rubbery and  diffusely nodular fibrous tissue.  The central specimen has a 2.8 x 2.3  x 2 cm area of gray-white to dark red indurated and nodular tissue  within which is found a clip.  A discrete mass lesion is not identified.  Within and adjacent to this area are few smooth lined cysts which are up  to 1.4 cm.  Margins: The indurated and nodular tissue at and around clip abuts the  anterior margin, and is greater than 1 cm from remaining margins,  however  there is nodular fibrous tissue not directly associated with  tissue around clip which abuts multiple margins.  Prognostic indicators: Not applicable  Block summary:  Blocks 1-8 = tissue at and around clip, without margin  Blocks 9-11 = tissue around clip abutting anterior margin  Blocks 12, 13 = posterior margin nearest tissue with clip  Blocks 14, 15 = superior margin nearest tissue with clip  Block 16 = inferior margin nearest tissue with clip  Block 17 = medial margin nearest tissue with clip  Block 18 = lateral margin nearest tissue with clip   SW 12/16/2021     Final Diagnosis performed by Jaquita Folds, MD.   Electronically  signed 12/18/2021  Technical component performed at [facility], [place]  [address], [place], [place] [address].   Professional component performed at [facility],  [place] [address] [place], [place] [address].   Immunohistochemistry Technical component (if applicable) was performed  at [facility]. [address], [place],  [place], [place] [address].   IMMUNOHISTOCHEMISTRY DISCLAIMER (if applicable):  Some of these immunohistochemical stains may have been developed and the  performance characteristics determine by [facility]. Some  may not have been cleared or approved by the U.S. Food and Drug  Administration. The FDA has determined that such clearance or approval  is not necessary. This test is used for clinical purposes. It should not  be regarded as investigational or for research. This laboratory is  certified under the [place]  (CLIA-88) as qualified to perform high complexity clinical laboratory  testing.  The controls stained appropriately.   Resulting Agency [facility]         Specimen Collected: 12/15/21 08:10 Last Resulted: 12/18/21 14:26      Lab Flowsheet    Order Details    View Encounter    Lab and Collection Details     Routing    Result History    View All Conversations on this Encounter        Linked Documents  View Image    Result Care Coordination   Patient Communication   Add Comments   Seen Back to Top       In Basket Actions   Reviewed   Result Note   View in In Basket     Surgical pathology Order: [id number] Status: Edited Result - FINAL    Visible to patient: Yes (seen)    Next appt: 12/25/2021 at 01:00 PM in Radiology (AP-DG DEXA)    0 Result Notes    Component 8 d ago  [place]  PATHOLOGY  CASE: [id number]  PATIENT: Candace Lewis  Surgical Pathology Report      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 BREAST, RIGHT, LUMPECTOMY:  - Focus of microinvasive carcinoma (less than 1 mm) arising in a  background of high-grade ductal carcinoma in situ with necrosis and  calcifications  - Resection margins are negative for invasive carcinoma  - DCIS is focally less than 1 mm from posterior margin  - Fibrocystic change with calcifications  - Biopsy site changes  - See oncology table       ONCOLOGY TABLE:   Procedure: Lumpectomy  Specimen Laterality: Right  Histologic Type: Focus of microinvasive ductal carcinoma  Histologic Grade:       Glandular (Acinar)/Tubular Differentiation: 3       Nuclear Pleomorphism: 3       Mitotic Rate: 1       Overall Grade: 2  Tumor Size: Less than 1 mm  Ductal Carcinoma In Situ: Present, high-grade with necrosis and  calcifications  Treatment Effect in the Breast: No known presurgical therapy  Margins: All margins negative for invasive carcinoma       Distance from Closest Margin (mm): Cannot be determined  DCIS Margins: Uninvolved by DCIS       Distance from Closest Margin (mm): Less than 1 mm, posterior margin  Regional Lymph Nodes: Not applicable (no lymph nodes submitted or found)  Distant Metastasis:       Distant Site(s) Involved: Not  applicable  Breast Biomarker Testing Performed on Previous Biopsy:       Testing Performed on Case Number: [id number]             Estrogen Receptor: 60%, positive, strong staining intensity             Progesterone Receptor: 1%, positive, strong staining  intensity  Pathologic Stage Classification (pTNM, AJCC 8th Edition): pT58m, pN not  assigned  Representative Tumor Block: A1  Comment(s): None   (v4.5.0.0)     GROSS DESCRIPTION:   Specimen type: Right breast lumpectomy, placed in formalin at 0830 hrs  on 12/15/2021.  Size: 8 cm from superior to inferior, 7.4 cm from medial to lateral,  ranges from 3 to 3.8 cm from anterior to posterior.  Orientation: There is a short suture at superior and long suture  lateral.  The specimen is inked as follows: Green Anterior, Blue  Inferior, Orange Lateral, Yellow Medial, Black Posterior, Red Superior.  Localized area: None  Cut surface: An RF device is found.  The specimen consists of equal  portions of soft fatty tissue, and gray-white soft to rubbery and  diffusely nodular fibrous tissue.  The central specimen has a 2.8 x 2.3  x 2 cm area of gray-white to dark red indurated and nodular tissue  within which is found a clip.  A discrete mass lesion is not identified.  Within and adjacent to this area are few smooth lined cysts which are up  to 1.4 cm.  Margins: The indurated and nodular tissue at and around clip abuts the  anterior margin, and is greater than 1 cm from remaining margins,  however there is nodular fibrous tissue not directly associated with  tissue around clip which abuts multiple margins.  Prognostic indicators: Not applicable  Block summary:  Blocks 1-8 = tissue at and around clip, without margin  Blocks 9-11 = tissue around clip abutting anterior margin  Blocks 12,  13 = posterior margin nearest tissue with clip  Blocks 14, 15 = superior margin nearest tissue with clip  Block 16 = inferior margin nearest tissue with  clip  Block 17 = medial margin nearest tissue with clip  Block 18 = lateral margin nearest tissue with clip   SW 12/16/2021     Final Diagnosis performed by Jaquita Folds, MD.   Electronically  signed 12/18/2021  Technical component performed at [facility], [place]  [address] [place], [place] [address].   Professional component performed at [facility],  [place] [address] [place], [place] [address].   Immunohistochemistry Technical component (if applicable) was performed  at [facility]. [address] [place],  [place], [place] [address].   IMMUNOHISTOCHEMISTRY DISCLAIMER (if applicable):  Some of these immunohistochemical stains may have been developed and the  performance characteristics determine by [facility]. Some  may not have been cleared or approved by the U.S. Food and Drug  Administration. The FDA has determined that such clearance or approval  is not necessary. This test is used for clinical purposes. It should not  be regarded as investigational or for research. This laboratory is  certified under the [place]  (CLIA-88) as qualified to perform high complexity clinical laboratory  testing.  The controls stained appropriately.   Resulting Agency [facility]         Specimen Collected: 12/15/21 08:10 Last Resulted: 12/18/21 14:26      Lab Flowsheet    Order Details    View Encounter    Lab and Collection Details    Routing    Result History    View All Conversations on this Encounter        Linked Documents  View Image    Result Care Coordination   Patient Communication   Add Comments   Seen Back to Top       Result Information  Status Priority Source  Edited Result - FINAL (12/18/2021 1426) Timed PATH Breast lump/simple mastectomy   Authorizing Provider Information  Name: Aviva Signs, MD Fax: [phone]  Phone: [phone]  Pager:    Status of Active Orders View All Orders From This Encounter Expected   Increase activity slowly [[id number] Custom] 12/15/21  Diet - low sodium heart healthy [DIET9 Custom] 12/15/21    Surgical pathology: Patient Communication   Add Comments   Seen    Cervical Cancer Screening - Results and Follow-ups Selected result Result date Tests and Procedures Follow-ups  12/15/2021   Surgical pathology  SURGICAL PATHOLOGY: SURGICAL PATHOLOGY  CASE: [id number]  PATIENT: Candace Lewis  Surgical Pathology Report      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 BREAST, RIGHT, LUMPECTOMY:  - Focus of microin...   Cervical Cancer Screening History Report   View SmartLink Info  Surgical pathology (Order [id number]) on 12/15/21  Pathology results reviewed with patient.      Assessment:    Doing well postoperatively.  I did tell the patient that there was 1 small focus of microinvasion.  Her DCIS margin was less than 1 mm, though there was no tumor at the inked margins. 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  I will discuss this with Dr. Delton Coombes to get his opinion.  She will be seeing him in early September.  Follow-up here as needed.

## 2021-12-24 ENCOUNTER — Other Ambulatory Visit (INDEPENDENT_AMBULATORY_CARE_PROVIDER_SITE_OTHER): Payer: Medicare Other | Admitting: General Surgery

## 2021-12-24 DIAGNOSIS — D0511 Intraductal carcinoma in situ of right breast: Secondary | ICD-10-CM

## 2021-12-24 DIAGNOSIS — Z09 Encounter for follow-up examination after completed treatment for conditions other than malignant neoplasm: Secondary | ICD-10-CM

## 2021-12-24 NOTE — Progress Notes (Signed)
Follow up cxr

## 2021-12-25 ENCOUNTER — Ambulatory Visit (HOSPITAL_COMMUNITY)
Admission: RE | Admit: 2021-12-25 | Discharge: 2021-12-25 | Disposition: A | Discharge status: Home / Self Care | Payer: Medicare Other | Source: Ambulatory Visit | Attending: Hematology | Admitting: Hematology

## 2021-12-25 ENCOUNTER — Ambulatory Visit (HOSPITAL_COMMUNITY)
Admission: RE | Admit: 2021-12-25 | Discharge: 2021-12-25 | Disposition: A | Discharge status: Home / Self Care | Payer: Medicare Other | Source: Ambulatory Visit | Attending: General Surgery | Admitting: General Surgery

## 2021-12-25 DIAGNOSIS — M85862 Other specified disorders of bone density and structure, left lower leg: Secondary | ICD-10-CM | POA: Insufficient documentation

## 2021-12-25 DIAGNOSIS — D0511 Intraductal carcinoma in situ of right breast: Secondary | ICD-10-CM | POA: Insufficient documentation

## 2021-12-30 ENCOUNTER — Ambulatory Visit (INDEPENDENT_AMBULATORY_CARE_PROVIDER_SITE_OTHER): Payer: Medicare Other | Admitting: General Surgery

## 2021-12-30 ENCOUNTER — Encounter: Payer: Self-pay | Admitting: General Surgery

## 2021-12-30 ENCOUNTER — Telehealth: Payer: Self-pay | Admitting: *Deleted

## 2021-12-30 VITALS — BP 119/83 | HR 81 | Temp 98.3°F | Resp 16 | Ht 67.5 in | Wt 181.0 lb

## 2021-12-30 DIAGNOSIS — Z09 Encounter for follow-up examination after completed treatment for conditions other than malignant neoplasm: Secondary | ICD-10-CM

## 2021-12-30 NOTE — Telephone Encounter (Signed)
Received call from patient [phone]~ telephone  Surgical Date: 08/14/203 Procedure: Partial Mastectomy  Patient reports that she is having increased pain and swelling to remaining right breast. States that she has discoloration from about 3 o'clock to 6 o'clock on breast as well.   States that she noted swelling and discoloration over the night.   Requested appointment to have Dr.Jenkins evaluate.  Appointment scheduled.

## 2021-12-30 NOTE — Progress Notes (Signed)
Subjective:     Candace Lewis  Presents back with some tenderness and ecchymosis along the inferior aspect of her right breast.  She states it is tender to touch.  She denies any fevers.  She denies any drainage from her incision.  She just wanted me to check out her right breast. Objective:    BP 119/83   Pulse 81   Temp 98.3 F (36.8 C) (Oral)   Resp 16   Ht 5' 7.5" (1.715 m)   Wt 181 lb (82.1 kg)   SpO2 95%   BMI 27.93 kg/m   General:  alert, cooperative, and no distress  Right breast incision healing well.  No surrounding erythema or induration noted.  She does have some ecchymosis and minimal erythema along the inferior mammary aspect of the right breast.  No significant tense hematoma present.     Assessment:    I told the patient I suspect this is settling of the seroma/ecchymosis of the right breast.  This is not uncommon after surgical intervention.  It is probably taking longer to heal as she has had multiple procedures done on the right breast in the past.  I reassured her that this does not appear to be infected or abnormal.    Plan:   I told her to continue cold or warm treatments to the breast as needed.  She should avoid any underwire bra.  She will call me should her symptoms get worse.

## 2022-01-06 ENCOUNTER — Inpatient Hospital Stay: Payer: Medicare Other | Attending: Hematology | Admitting: Hematology

## 2022-01-06 VITALS — BP 128/84 | HR 103 | Temp 98.3°F | Resp 19 | Ht 67.5 in | Wt 180.0 lb

## 2022-01-06 DIAGNOSIS — E559 Vitamin D deficiency, unspecified: Secondary | ICD-10-CM | POA: Insufficient documentation

## 2022-01-06 DIAGNOSIS — Z923 Personal history of irradiation: Secondary | ICD-10-CM | POA: Insufficient documentation

## 2022-01-06 DIAGNOSIS — Z87891 Personal history of nicotine dependence: Secondary | ICD-10-CM | POA: Insufficient documentation

## 2022-01-06 DIAGNOSIS — Z85810 Personal history of malignant neoplasm of tongue: Secondary | ICD-10-CM | POA: Insufficient documentation

## 2022-01-06 DIAGNOSIS — D0511 Intraductal carcinoma in situ of right breast: Secondary | ICD-10-CM | POA: Diagnosis present

## 2022-01-06 DIAGNOSIS — Z801 Family history of malignant neoplasm of trachea, bronchus and lung: Secondary | ICD-10-CM | POA: Insufficient documentation

## 2022-01-06 DIAGNOSIS — Z9221 Personal history of antineoplastic chemotherapy: Secondary | ICD-10-CM | POA: Insufficient documentation

## 2022-01-06 DIAGNOSIS — E039 Hypothyroidism, unspecified: Secondary | ICD-10-CM | POA: Insufficient documentation

## 2022-01-06 DIAGNOSIS — Z170 Estrogen receptor positive status [ER+]: Secondary | ICD-10-CM | POA: Insufficient documentation

## 2022-01-06 DIAGNOSIS — Z79899 Other long term (current) drug therapy: Secondary | ICD-10-CM | POA: Insufficient documentation

## 2022-01-06 DIAGNOSIS — M8580 Other specified disorders of bone density and structure, unspecified site: Secondary | ICD-10-CM | POA: Insufficient documentation

## 2022-01-06 DIAGNOSIS — I10 Essential (primary) hypertension: Secondary | ICD-10-CM | POA: Insufficient documentation

## 2022-01-06 MED ORDER — ANASTROZOLE 1 MG PO TABS: 1.0000 mg | ORAL_TABLET | Freq: Every day | ORAL | 6 refills | Status: DC

## 2022-01-06 NOTE — Progress Notes (Signed)
[facility] [address], [place] [address]   Patient Care Team: Adaline Sill, NP as PCP - General (Internal Medicine) Herminio Commons, MD (Inactive) as PCP - Cardiology (Cardiology) Gala Romney Cristopher Estimable, MD as Consulting Physician (Gastroenterology) Derek Jack, MD as Medical Oncologist (Medical Oncology) Brien Mates, RN as Oncology Nurse Navigator (Medical Oncology)  CHIEF COMPLAINTS/PURPOSE OF CONSULTATION:  Newly diagnosed right breast DCIS  HISTORY OF PRESENTING ILLNESS:  [place] 81 y.o. female seen for follow-up after recent lumpectomy on 12/15/2021.  She also underwent DEXA scan.  She reports some pain in the breast since surgery.  She was evaluated by Dr. Arnoldo Morale 1 to 2 weeks ago.  In terms of breast cancer risk profile:  She menarched at early age of 41 and she has a hysterectomy at 81 years old.   She had 1 pregnancy She never received birth control pills.  She was never exposed to fertility medications or hormone replacement therapy.  She has no family history of Breast/GYN/GI cancer  I reviewed her records extensively and collaborated the history with the patient.  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   01/19/2014 PET scan   Tongue mass with 2 level II Right neck nodes T2N2b, Stage IVA   02/08/2014 - 04/03/2014 Radiation Therapy   Initiation of radiation therapy with 70 Gy delivered to the right base of tongue/tonsil 63 Gy to high risk nodal echelons, 56 Gy to the intermediate r base of tongue/tonsil/bilateral neck nodes   02/08/2014 - 03/15/2014 Chemotherapy   weekly cisplatin, only 4 doses given, severe toxicity with neutropenia, dehydration, nausea, vomiting, hospitalization and obstipation   10/24/2015 PET scan   low level non malig range hyper metab in L apical sub solid pulm nodule,  suspicious for low grade adeno. 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 Britta Mccreedy: LA Class B esophagitis. PEG tube in place. erythema in proximal esophagus.    ESOPHAGOGASTRODUODENOSCOPY (EGD) WITH PROPOFOL N/A 03/11/2017   Web in upper third of esophagus s/p dilation with scope passage, small hiatal hernia, normal duodenum, no specimens collected   FUDUCIAL PLACEMENT N/A 10/28/2015   Procedure: PLACEMENT OF FUDUCIAL TIMES THREE; LEFT UPPER LOBE BIOPSY;  Surgeon: Grace Isaac, MD;  Location: [facility];  Service: Thoracic;  Laterality:  N/A;   MASTECTOMY, PARTIAL Left 1984   no cancer, fibrocystic breast disease   SKIN SURGERY  1960   moles removed from various areas per patient report   TONSILLECTOMY  1950   per patient report   [place]   vaginal hyst per patient report   [place] N/A 10/28/2015   Procedure: VIDEO BRONCHOSCOPY WITH  ENDOBRONCHIAL NAVIGATION;  Surgeon: Grace Isaac, MD;  Location: [facility];  Service: Thoracic;  Laterality: N/A;    SOCIAL HISTORY: Social History   Socioeconomic History   Marital status: Widowed    Spouse name: Not on file   Number of children: 2   Years of education: Not on file   Highest education level: Not on file  Occupational History   Occupation: retired  Tobacco Use   Smoking status: Former    Packs/day: 1.00    Years: 23.00    Total pack years: 23.00    Types: Cigarettes    Quit date: 05/03/1982    Years since quitting: 39.7   Smokeless tobacco: Never  Vaping Use   Vaping Use: Never used  Substance and Sexual Activity   Alcohol use: No    Alcohol/week: 0.0 standard drinks of alcohol   Drug use: No   Sexual activity: Never    Birth control/protection: None  Other Topics Concern   Not on file  Social History Narrative   Not on file   Social Determinants of Health   Financial Resource Strain: Not on file  Food Insecurity: Not on file  Transportation Needs: Not on file  Physical Activity: Not on file  Stress: Not on file  Social Connections: Not on file  Intimate Partner Violence: Not on file    FAMILY HISTORY: Family History  Problem Relation Age of Onset   Heart disease Mother     ALLERGIES:  is allergic to adhesive [tape] and latex.  MEDICATIONS:  Current Outpatient Medications  Medication Sig Dispense Refill   levothyroxine (SYNTHROID) 88 MCG tablet Take 88 mcg by mouth daily before breakfast.     metoprolol tartrate (LOPRESSOR) 25 MG tablet Take 12.5 mg by mouth 2 (two) times daily.     omeprazole (PRILOSEC OTC) 20 MG tablet 1 tablet 30 minutes before morning meal Orally Once a day for 90 days     rosuvastatin (CRESTOR) 20 MG tablet Take 20 mg by mouth at bedtime.     trolamine salicylate (ASPERCREME) 10 % cream Apply 1 Application topically as needed for muscle pain.     No current facility-administered medications for this visit.     REVIEW OF SYSTEMS:   Review of Systems  Constitutional:  Negative for appetite change and fatigue.  Cardiovascular:  Positive for chest pain (Right breast pain) and palpitations.  Neurological:  Positive for numbness.  Psychiatric/Behavioral:  The patient is nervous/anxious.   All other systems reviewed and are negative.   PHYSICAL EXAMINATION: ECOG PERFORMANCE STATUS: 1 - Symptomatic but completely ambulatory  Vitals:   01/06/22 1432  BP: 128/84  Pulse: (!) 103  Resp: 19  Temp: 98.3 F (36.8 C)  SpO2: 100%   Filed Weights   01/06/22 1432  Weight: 180 lb (81.6 kg)   Physical Exam Vitals reviewed.  Constitutional:      Appearance: Normal appearance.  Cardiovascular:     Rate and Rhythm: Normal rate and regular rhythm.     Pulses: Normal pulses.  Heart sounds: Normal heart sounds.  Pulmonary:     Effort: Pulmonary effort is normal.     Breath sounds: Normal breath sounds.  Chest:  Breasts:    Right: Skin change (UOQ bruise) and tenderness present. No inverted nipple or nipple discharge.  Musculoskeletal:     Right lower leg: No edema.     Left lower leg: No edema.  Lymphadenopathy:     Upper Body:     Right upper body: No supraclavicular or axillary adenopathy.     Left upper body: No supraclavicular or axillary adenopathy.  Neurological:     General: No focal deficit present.     Mental Status: She is alert and oriented to person, place, and time.  Psychiatric:        Mood and Affect: Mood normal.        Behavior: Behavior normal.     Breast Exam Chaperone: Thana Ates    LABORATORY DATA:  I have reviewed the data as listed Recent Results (from the past 2160 hour(s))  TSH     Status: Abnormal   Collection Time: 11/25/21  9:37 AM  Result Value Ref Range   TSH 6.506 (H) 0.350 - 4.500 uIU/mL    Comment: Performed by a 3rd Generation assay with a functional sensitivity of <=0.01 uIU/mL. Performed at [facility], [address], [place],  [place] [address]   Comprehensive metabolic panel     Status: Abnormal   Collection Time: 11/25/21  9:37 AM  Result Value Ref Range   Sodium 138 135 - 145 mmol/L   Potassium 4.1 3.5 - 5.1 mmol/L   Chloride 107 98 - 111 mmol/L   CO2 25 22 - 32 mmol/L   Glucose, Bld 111 (H) 70 - 99 mg/dL    Comment: Glucose reference range applies only to samples taken after fasting for at least 8 hours.   BUN 24 (H) 8 - 23 mg/dL   Creatinine, Ser 0.92 0.44 - 1.00 mg/dL   Calcium 9.3 8.9 - 10.3 mg/dL   Total Protein 7.2 6.5 - 8.1 g/dL   Albumin 3.9 3.5 - 5.0 g/dL   AST 25 15 - 41 U/L   ALT 20 0 - 44 U/L   Alkaline Phosphatase 69 38 - 126 U/L   Total Bilirubin 0.9 0.3 - 1.2 mg/dL   GFR, Estimated >60 >60 mL/min    Comment: (NOTE) Calculated using the CKD-EPI Creatinine Equation (2021)    Anion gap 6 5 - 15    Comment: Performed at [facility], [address] [place], [place] [address]  CBC with Differential     Status: Abnormal   Collection Time: 11/25/21  9:37 AM  Result Value Ref Range   WBC 3.5 (L) 4.0 - 10.5 K/uL   RBC 4.81 3.87 - 5.11 MIL/uL   Hemoglobin 13.8 12.0 - 15.0 g/dL   HCT 41.1 36.0 - 46.0 %   MCV 85.4 80.0 - 100.0 fL   MCH 28.7 26.0 - 34.0 pg   MCHC 33.6 30.0 - 36.0 g/dL   RDW 13.8 11.5 - 15.5 %   Platelets 156 150 - 400 K/uL   nRBC 0.0 0.0 - 0.2 %   Neutrophils Relative % 67 %   Neutro Abs 2.3 1.7 - 7.7 K/uL   Lymphocytes Relative 21 %   Lymphs Abs 0.7 0.7 - 4.0 K/uL   Monocytes Relative 9 %   Monocytes Absolute 0.3 0.1 - 1.0 K/uL   Eosinophils Relative 2 %  Eosinophils Absolute 0.1 0.0 - 0.5 K/uL   Basophils Relative 1 %   Basophils Absolute 0.0 0.0 - 0.1 K/uL   Immature Granulocytes 0 %   Abs Immature Granulocytes 0.01 0.00 - 0.07 K/uL    Comment: Performed at [facility], [address] [place], [place] [address]  Vitamin D 25 hydroxy     Status: None   Collection Time: 11/25/21  9:37 AM  Result Value Ref Range   Vit D, 25-Hydroxy 30.83 30 - 100 ng/mL    Comment:  (NOTE) Vitamin D deficiency has been defined by the Institute of Medicine  and an Endocrine Society practice guideline as a level of serum 25-OH  vitamin D less than 20 ng/mL (1,2). The Endocrine Society went on to  further define vitamin D insufficiency as a level between 21 and 29  ng/mL (2).  1. IOM (Institute of Medicine). 2010. Dietary reference intakes for  calcium and D. [place]: The [employer]. 2. Holick MF, Binkley Gerald, Bischoff-Ferrari HA, et al. Evaluation,  treatment, and prevention of vitamin D deficiency: an Endocrine  Society clinical practice guideline, JCEM. 2011 Jul; 96(7): 1911-30.  Performed at [facility], [place] [address] [place], [place] [address]   Surgical pathology     Status: None   Collection Time: 12/15/21  8:10 AM  Result Value Ref Range   SURGICAL PATHOLOGY      SURGICAL PATHOLOGY CASE: [id number] PATIENT: Kersti Slabaugh Surgical Pathology Report     Clinical History: Right breast ductal carcinoma in situ (crm)     FINAL MICROSCOPIC DIAGNOSIS:  A. BREAST, RIGHT, LUMPECTOMY: - Focus of microinvasive carcinoma (less than 1 mm) arising in a background of high-grade ductal carcinoma in situ with necrosis and calcifications - Resection margins are negative for invasive carcinoma - DCIS is focally less than 1 mm from posterior margin - Fibrocystic change with calcifications - Biopsy site changes - See oncology table      ONCOLOGY TABLE:  Procedure: Lumpectomy Specimen Laterality: Right Histologic Type: Focus of microinvasive ductal carcinoma Histologic Grade:      Glandular (Acinar)/Tubular Differentiation: 3      Nuclear Pleomorphism: 3      Mitotic Rate: 1      Overall Grade: 2 Tumor Size: Less than 1 mm Ductal Carcinoma In Situ: Present, high-grade with necrosis and calcifications Treatment Effect in  the Breast: No known presurgical therapy Margins: All margins negative for invasive carcinoma       Distance from Closest Margin (mm): Cannot be determined DCIS Margins: Uninvolved by DCIS      Distance from Closest Margin (mm): Less than 1 mm, posterior margin Regional Lymph Nodes: Not applicable (no lymph nodes submitted or found) Distant Metastasis:      Distant Site(s) Involved: Not applicable Breast Biomarker Testing Performed on Previous Biopsy:      Testing Performed on Case Number: [id number]            Estrogen Receptor: 60%, positive, strong staining intensity            Progesterone Receptor: 1%, positive, strong staining intensity Pathologic Stage Classification (pTNM, AJCC 8th Edition): pT44m, pN not assigned Representative Tumor Block: A1 Comment(s): None  (v4.5.0.0)    GROSS DESCRIPTION:  Specimen type: Right breast lumpectomy, placed in formalin at 0830 hrs on 12/15/2021. Size: 8 cm from superior to inferior, 7.4 cm from medial to lateral, ran ges from 3 to 3.8 cm from anterior to posterior. Orientation: There is a  short suture at superior and long suture lateral.  The specimen is inked as follows: Green Anterior, Blue Inferior, Orange Lateral, Yellow Medial, Black Posterior, Red Superior. Localized area: None Cut surface: An RF device is found.  The specimen consists of equal portions of soft fatty tissue, and gray-white soft to rubbery and diffusely nodular fibrous tissue.  The central specimen has a 2.8 x 2.3 x 2 cm area of gray-white to dark red indurated and nodular tissue within which is found a clip.  A discrete mass lesion is not identified. Within and adjacent to this area are few smooth lined cysts which are up to 1.4 cm. Margins: The indurated and nodular tissue at and around clip abuts the anterior margin, and is greater than 1 cm from remaining margins, however there is nodular fibrous tissue not directly associated with tissue around clip which abuts multiple margins. Prognostic indicators: Not  applicable Block summary: Blocks 1-8 = tissue  at and around clip, without margin Blocks 9-11 = tissue around clip abutting anterior margin Blocks 12, 13 = posterior margin nearest tissue with clip Blocks 14, 15 = superior margin nearest tissue with clip Block 16 = inferior margin nearest tissue with clip Block 17 = medial margin nearest tissue with clip Block 18 = lateral margin nearest tissue with clip  SW 12/16/2021    Final Diagnosis performed by Jaquita Folds, MD.   Electronically signed 12/18/2021 Technical component performed at [facility], [place] [address], [place], [place] [address].  Professional component performed at [facility], [place] [address] [place], [place] [address].  Immunohistochemistry Technical component (if applicable) was performed at [facility]. [address] [place], [place], [place] [address].   IMMUNOHISTOCHEMISTRY DISCLAIMER (if applicable): Some of these immunohistochemical  stains may have been developed and the performance characteristics determine by [facility]. Some may not have been cleared or approved by the U.S. Food and Drug Administration. The FDA has determined that such clearance or approval is not necessary. This test is used for clinical purposes. It should not be regarded as investigational or for research. This laboratory is certified under the [place] (CLIA-88) as qualified to perform high complexity clinical laboratory testing.  The controls stained appropriately.     RADIOGRAPHIC STUDIES: I have personally reviewed the radiological reports and agreed with the findings in the report. DG Chest 2 View  Result Date: 12/26/2021 CLINICAL DATA:  Abnormal chest x-ray, nipple markers are placed. EXAM: CHEST - 2 VIEW COMPARISON:  Chest x-ray 12/12/2021.  CT of the chest 02/18/2017. FINDINGS: The heart size and mediastinal contours are within normal limits. Surgical clips  are seen in the left upper lobe. No focal lung infiltrate. No definitive pulmonary nodule. No pleural effusion or pneumothorax. The visualized skeletal structures are unremarkable. IMPRESSION: No active cardiopulmonary disease. Electronically Signed   By: Ronney Asters M.D.   On: 12/26/2021 22:00   DG Bone Density  Result Date: 12/25/2021 EXAM: DUAL X-RAY ABSORPTIOMETRY (DXA) FOR BONE MINERAL DENSITY IMPRESSION: Your patient Shatonia Hoots completed a BMD test on 12/25/2021 using the [place] (software version: 14.10) manufactured by [employer]. The following summarizes the results of our evaluation. Technologist: BRG PATIENT BIOGRAPHICAL: Name: [place], Lewis Patient ID: [id number] Birth Date: [birth year] Height: 67.5 in. Gender: Female Exam Date: 12/25/2021 Weight: 182.0 lbs. Indications: History of Fracture (Adult) Fractures: Elbow Treatments: DENSITOMETRY RESULTS: Site      Region  Measured Date Measured Age WHO Classification Young Adult T-score BMD         %Change vs. Previous Significant Change (*) AP Spine L1-L3 12/25/2023 79.3 Normal -0.5 1.113 g/cm2 - - DualFemur Neck Left 12/25/20[age].3 Osteopenia -1.7 0.798 g/cm2 - - ASSESSMENT: The BMD measured at Femur Neck Left is 0.798 g/cm2 with a T-score of -1.7. This patient is considered osteopenic according to [facility]) criteria. L4 was excluded due to advanced degenerative changes. The scan quality is good. Per official position of the ISCD, it is not possible to quantitatively compare BMD or calculate a LSC between different facilities or devices. World Pharmacologist [facility]) criteria for post-menopausal, Caucasian Women: Normal:       T-score at or above -1 SD Osteopenia:   T-score between -1 and -2.5 SD Osteoporosis: T-score at or below -2.5 SD RECOMMENDATIONS: 1. All patients should optimize calcium and vitamin D intake. 2. Consider FDA-approved medical therapies in postmenopausal women and  med aged 61 years and older, based on the following: a. A hip or vertebral (clinical or morphometric) fracture b. T-score< -2.5 at the femoral neck or spine after appropriate evaluation to exclude secondary causes c. Low bone mass (T-score between -1.0 and -2.5 at the femoral neck or spine) and a 10-year probability of a hip fracture > 3% or a 10-year probability of a major osteoporosis-related fracture > 20% based on the [place]-adapted WHO algorithm d. Clinician judgment and/or patient preferences may indicate treatment for people with 10-year fracture probabilities above or below these levels FOLLOW-UP: Patients with diagnosis of osteoporsis or at high risk for fracture should have regular bone mineral density tests. For patients eligible for Medicare, routine testing is allowed once every 2 years. The testing frequency can be increased to one year for patients who have rapidly progressing disease, those who are receiving or discontinuing medical therapy to restore bone mass, or have additional risk factors. I have reviewed this report, and agree with the above findings. [facility] Your patient [place] completed a FRAX assessment on 12/25/2021 using the [place] (analysis version: 14.10) manufactured by [employer]. The following summarizes the results of our evaluation. PATIENT BIOGRAPHICAL: Name: [place], Lewis Patient ID: [id number] Birth Date: February 02, 1941 Height:    67.5 in. Gender:     Female    [age]       Weight:    182.0 lbs. Ethnicity:  White                            Exam Date: 12/25/2021 FRAX* RESULTS:  (version: 3.5) 10-year Probability of Fracture1 Major Osteoporotic Fracture2 Hip Fracture 20.5% 4.8% Population: [place] (Caucasian) Risk Factors: History of Fracture (Adult) Based on Femur (Left) Neck BMD 1 -The 10-year probability of fracture may be lower than reported if the patient has received treatment. 2 -Major Osteoporotic Fracture: Clinical  Spine, Forearm, Hip or Shoulder *FRAX is a Materials engineer of the [employer] for Metabolic Bone Disease, a [place] (WHO) [facility]. ASSESSMENT: The probability of a major osteoporotic fracture is 20.5% within the next ten years. The probability of a hip fracture is 4.8% within the next ten years. Electronically Signed   By: Zerita Boers M.D.   On: 12/25/2021 13:05   MM BREAST SURGICAL SPECIMEN  Result Date: 12/15/2021 CLINICAL DATA:  Status post radiofrequency tag localized  right breast lumpectomy for high-grade DCIS. EXAM: SPECIMEN RADIOGRAPH OF THE RIGHT BREAST COMPARISON:  Previous exam(s). FINDINGS: Status post excision of the right breast. The radiofrequency tag, malignant calcifications and coil shaped clip are present within the specimen. IMPRESSION: Specimen radiograph of the right breast. Electronically Signed   By: Claudie Revering M.D.   On: 12/15/2021 08:34  Chest 2 View  Result Date: 12/13/2021 CLINICAL DATA:  Preop for breast cancer surgery EXAM: CHEST - 2 VIEW COMPARISON:  Radiographs 10/18/2015 FINDINGS: Possible nodular opacity versus nipple shadow in the left lower lung. Bibasilar atelectasis. No pleural effusion or pneumothorax. Normal cardiomediastinal silhouette. No acute osseous abnormality. Surgical clips about the chest. IMPRESSION: Nodular opacity versus nipple shadow in the left lower lung. Consider repeat radiographs with nipple markers. Electronically Signed   By: Placido Sou M.D.   On: 12/13/2021 00:45   MM RT RADIO FREQUENCY TAG LOC MAMMO GUIDE  Result Date: 12/09/2021 CLINICAL DATA:  Patient presents for localization of recently diagnosed right breast high-grade ductal carcinoma in situ prior to surgical excision. EXAM: MAMMOGRAPHIC GUIDED RADIOFREQUENCY DEVICE LOCALIZATION OF THE RIGHT BREAST COMPARISON:  Previous exam(s). FINDINGS: Patient presents for radiofrequency device localization prior to surgical  excision. I met with the patient and we discussed the procedure of radiofrequency device localization including benefits and alternatives. We discussed the high likelihood of a successful procedure. We discussed the risks of the procedure including infection, bleeding, tissue injury and further surgery. Informed, written consent was given. The usual time-out protocol was performed immediately prior to the procedure. Using mammographic guidance, sterile technique, 1% lidocaine as local anesthesia, a radiofrequency tag was used to localize coil clip and associated calcifications using a superior approach. The follow-up mammogram images confirm that the RF device is in the expected location and are marked for Dr. Arnoldo Morale. Follow-up survey of the patient confirms the presence of the RF device. The patient tolerated the procedure well and was released from the breast Imaging Department. IMPRESSION: Radiofrequency device localization of the right breast. No apparent complications. Electronically Signed   By: Lajean Manes M.D.   On: 12/09/2021 09:40    ASSESSMENT:  Right breast DCIS, high-grade: - She felt a lump in the right breast UOQ 2 months ago with some soreness. - Mammogram (10/28/2021) calcifications in the UOQ right breast. - Biopsy (11/17/2021): High-grade DCIS, solid type with comedonecrosis.  Negative for invasive carcinoma.  Microcalcification present.  DCIS measures 6 mm in greatest linear extent.  ER 60% positive and PR 1% positive. - She had history of fibrocystic disease and had previously cyst removals x3 which were benign.   Social/family history: - She lives at home by herself.  She is seen with her granddaughter.  She is independent of ADLs and IADLs.  She did construction/electrical work.  Quit smoking in 1984. - Maternal grandfather had lung cancer.  3.  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   PLAN:  Right breast DCIS with microinvasion, ER/PR positive. - We reviewed pathology from 12/15/2021 which showed focus of microinvasive carcinoma, less than 1 mm arising in a background of high-grade DCIS with necrosis and calcifications.  Margins negative for invasive carcinoma.  DCIS is focally less than 1 mm from posterior margin.  ER 60% positive and PR 1% positive.  pT1 MI, PNX - She reports some tenderness in the right breast area with mild erythema.  No signs of infection. - Given  her age and other risk factors, I agree that reexcision of the close margin can be avoided.  We will make a referral to radiation oncology. - I talked to her about initiating her on antiestrogen therapy with anastrozole.  She has difficulty swallowing pills.  I have recommended her to dissolve the pill in water for up to 5 minutes and drink it. - We talked about side effects of antiestrogen therapy including hot flashes and musculoskeletal symptoms.  We have sent a prescription to her pharmacy.  RTC 4 months for follow-up.  2.  Nutrition: - She is not able to eat any solid food since she completed chemoradiation in December 2015. - Continue nutritional Ensure supplements.  3.  Osteopenia:       -DEXA scan reviewed by me on 12/26/2015 shows T score -1.7.  Recommend calcium and vitamin D liquid supplements.  We will plan to repeat DEXA scan in 2 to 3 years.    Derek Jack, MD 01/06/22 5:25 PM  [place] [phone]

## 2022-01-06 NOTE — Patient Instructions (Addendum)
[facility]  Discharge Instructions  You were seen and examined today by Dr. Delton Coombes.  Dr. Delton Coombes discussed your most recent bone density scan and it shows osteopenia.   Dr. Delton Coombes is sending in Anastrozole 1 mg tablet to be taken once daily. You can dissolve it in water for about 5 minutes until totally dissolved and then take it.   Get liquid Calcium and Vitamin D over the counter to take once daily.  We are sending a referral to [facility] for radiation.   Follow-up as scheduled in 4 months.    Thank you for choosing [facility] to provide your oncology and hematology care.   To afford each patient quality time with our provider, please arrive at least 15 minutes before your scheduled appointment time. You may need to reschedule your appointment if you arrive late (10 or more minutes). Arriving late affects you and other patients whose appointments are after yours.  Also, if you miss three or more appointments without notifying the office, you may be dismissed from the clinic at the provider's discretion.    Again, thank you for choosing [facility].  Our hope is that these requests will decrease the amount of time that you wait before being seen by our physicians.   If you have a lab appointment with the [facility] please come in thru the Main Entrance and check in at the main information desk.           _____________________________________________________________  Should you have questions after your visit to [facility], please contact our office at [phone] and follow the prompts.  Our office hours are 8:00 a.m. to 4:30 p.m. Monday - Thursday and 8:00 a.m. to 2:30 p.m. Friday.  Please note that voicemails left after 4:00 p.m. may not be returned until the following business day.  We are closed weekends and all major holidays.  You do have access to a nurse 24-7, just call the main number  to the clinic [phone] and do not press any options, hold on the line and a nurse will answer the phone.    For prescription refill requests, have your pharmacy contact our office and allow 72 hours.    Masks are optional in the cancer centers. If you would like for your care team to wear a mask while they are taking care of you, please let them know. You may have one support person who is at least 79 years old accompany you for your appointments.

## 2022-03-23 ENCOUNTER — Other Ambulatory Visit: Payer: Self-pay | Admitting: *Deleted

## 2022-03-23 DIAGNOSIS — N6321 Unspecified lump in the left breast, upper outer quadrant: Secondary | ICD-10-CM

## 2022-03-23 DIAGNOSIS — D0511 Intraductal carcinoma in situ of right breast: Secondary | ICD-10-CM

## 2022-03-23 NOTE — Progress Notes (Signed)
Patient phoned to advise that a new lump has been identified by Dr. Lynnette Caffey in upper outer quadrant of left breast in the setting of malignant neoplasm upper outer quadrant of right breat.  This is a new finding and he has suggested a diagnostic mammogram and [place].   Ok per Dr. Delton Coombes and will be scheduled.

## 2022-04-14 ENCOUNTER — Ambulatory Visit (HOSPITAL_COMMUNITY)
Admission: RE | Admit: 2022-04-14 | Discharge: 2022-04-14 | Disposition: A | Discharge status: Home / Self Care | Payer: Medicare Other | Source: Ambulatory Visit | Attending: Hematology | Admitting: Hematology

## 2022-04-14 DIAGNOSIS — N6321 Unspecified lump in the left breast, upper outer quadrant: Secondary | ICD-10-CM | POA: Insufficient documentation

## 2022-04-14 DIAGNOSIS — D0511 Intraductal carcinoma in situ of right breast: Secondary | ICD-10-CM

## 2022-05-07 ENCOUNTER — Inpatient Hospital Stay: Payer: Medicare Other | Attending: Hematology

## 2022-05-07 DIAGNOSIS — Z79899 Other long term (current) drug therapy: Secondary | ICD-10-CM | POA: Diagnosis not present

## 2022-05-07 DIAGNOSIS — Z79890 Hormone replacement therapy (postmenopausal): Secondary | ICD-10-CM | POA: Diagnosis not present

## 2022-05-07 DIAGNOSIS — Z923 Personal history of irradiation: Secondary | ICD-10-CM | POA: Diagnosis not present

## 2022-05-07 DIAGNOSIS — R519 Headache, unspecified: Secondary | ICD-10-CM | POA: Diagnosis not present

## 2022-05-07 DIAGNOSIS — I10 Essential (primary) hypertension: Secondary | ICD-10-CM | POA: Diagnosis not present

## 2022-05-07 DIAGNOSIS — Z85810 Personal history of malignant neoplasm of tongue: Secondary | ICD-10-CM | POA: Insufficient documentation

## 2022-05-07 DIAGNOSIS — Z9221 Personal history of antineoplastic chemotherapy: Secondary | ICD-10-CM | POA: Insufficient documentation

## 2022-05-07 DIAGNOSIS — Z801 Family history of malignant neoplasm of trachea, bronchus and lung: Secondary | ICD-10-CM | POA: Diagnosis not present

## 2022-05-07 DIAGNOSIS — E039 Hypothyroidism, unspecified: Secondary | ICD-10-CM | POA: Insufficient documentation

## 2022-05-07 DIAGNOSIS — Z79811 Long term (current) use of aromatase inhibitors: Secondary | ICD-10-CM | POA: Diagnosis not present

## 2022-05-07 DIAGNOSIS — Z87891 Personal history of nicotine dependence: Secondary | ICD-10-CM | POA: Diagnosis not present

## 2022-05-07 DIAGNOSIS — Z8501 Personal history of malignant neoplasm of esophagus: Secondary | ICD-10-CM | POA: Diagnosis not present

## 2022-05-07 DIAGNOSIS — M8580 Other specified disorders of bone density and structure, unspecified site: Secondary | ICD-10-CM | POA: Diagnosis not present

## 2022-05-07 DIAGNOSIS — D0511 Intraductal carcinoma in situ of right breast: Secondary | ICD-10-CM | POA: Insufficient documentation

## 2022-05-07 LAB — CBC WITH DIFFERENTIAL/PLATELET
Abs Immature Granulocytes: 0.01 10*3/uL (ref 0.00–0.07)
Basophils Absolute: 0 10*3/uL (ref 0.0–0.1)
Basophils Relative: 1 %
Eosinophils Absolute: 0.1 10*3/uL (ref 0.0–0.5)
Eosinophils Relative: 2 %
HCT: 40.5 % (ref 36.0–46.0)
Hemoglobin: 13.8 g/dL (ref 12.0–15.0)
Immature Granulocytes: 0 %
Lymphocytes Relative: 19 %
Lymphs Abs: 0.6 10*3/uL — ABNORMAL LOW (ref 0.7–4.0)
MCH: 30 pg (ref 26.0–34.0)
MCHC: 34.1 g/dL (ref 30.0–36.0)
MCV: 88 fL (ref 80.0–100.0)
Monocytes Absolute: 0.3 10*3/uL (ref 0.1–1.0)
Monocytes Relative: 8 %
Neutro Abs: 2.3 10*3/uL (ref 1.7–7.7)
Neutrophils Relative %: 70 %
Platelets: 148 10*3/uL — ABNORMAL LOW (ref 150–400)
RBC: 4.6 MIL/uL (ref 3.87–5.11)
RDW: 13.7 % (ref 11.5–15.5)
WBC: 3.3 10*3/uL — ABNORMAL LOW (ref 4.0–10.5)
nRBC: 0 % (ref 0.0–0.2)

## 2022-05-07 LAB — COMPREHENSIVE METABOLIC PANEL
ALT: 20 U/L (ref 0–44)
AST: 23 U/L (ref 15–41)
Albumin: 3.7 g/dL (ref 3.5–5.0)
Alkaline Phosphatase: 77 U/L (ref 38–126)
Anion gap: 9 (ref 5–15)
BUN: 28 mg/dL — ABNORMAL HIGH (ref 8–23)
CO2: 26 mmol/L (ref 22–32)
Calcium: 9.3 mg/dL (ref 8.9–10.3)
Chloride: 103 mmol/L (ref 98–111)
Creatinine, Ser: 0.87 mg/dL (ref 0.44–1.00)
GFR, Estimated: >60 mL/min (ref >60)
Glucose, Bld: 111 mg/dL — ABNORMAL HIGH (ref 70–99)
Potassium: 3.8 mmol/L (ref 3.5–5.1)
Sodium: 138 mmol/L (ref 135–145)
Total Bilirubin: 0.8 mg/dL (ref 0.3–1.2)
Total Protein: 7.2 g/dL (ref 6.5–8.1)

## 2022-05-07 LAB — VITAMIN D 25 HYDROXY (VIT D DEFICIENCY, FRACTURES): Vit D, 25-Hydroxy: 35.74 ng/mL (ref 30–100)

## 2022-05-14 ENCOUNTER — Other Ambulatory Visit (HOSPITAL_COMMUNITY): Payer: Self-pay | Admitting: Hematology

## 2022-05-14 ENCOUNTER — Inpatient Hospital Stay: Payer: Medicare Other | Admitting: Hematology

## 2022-05-14 VITALS — BP 122/95 | HR 75 | Temp 97.5°F | Resp 16 | Wt 182.5 lb

## 2022-05-14 DIAGNOSIS — Z9889 Other specified postprocedural states: Secondary | ICD-10-CM

## 2022-05-14 DIAGNOSIS — N6001 Solitary cyst of right breast: Secondary | ICD-10-CM

## 2022-05-14 DIAGNOSIS — D0511 Intraductal carcinoma in situ of right breast: Secondary | ICD-10-CM | POA: Diagnosis not present

## 2022-05-14 NOTE — Patient Instructions (Signed)
[facility]  Discharge Instructions  You were seen and examined today by Dr. Delton Coombes.  Dr. Delton Coombes discussed your most recent lab work and mammogram which revealed that everything looks good.  Try taking melatonin 5 mg at 6 PM to see if it will help you sleep.  Follow-up as scheduled in 6 months with labs.    Thank you for choosing [facility] to provide your oncology and hematology care.   To afford each patient quality time with our provider, please arrive at least 15 minutes before your scheduled appointment time. You may need to reschedule your appointment if you arrive late (10 or more minutes). Arriving late affects you and other patients whose appointments are after yours.  Also, if you miss three or more appointments without notifying the office, you may be dismissed from the clinic at the provider's discretion.    Again, thank you for choosing [facility].  Our hope is that these requests will decrease the amount of time that you wait before being seen by our physicians.   If you have a lab appointment with the [facility] please come in thru the Main Entrance and check in at the main information desk.           _____________________________________________________________  Should you have questions after your visit to [facility], please contact our office at [phone] and follow the prompts.  Our office hours are 8:00 a.m. to 4:30 p.m. Monday - Thursday and 8:00 a.m. to 2:30 p.m. Friday.  Please note that voicemails left after 4:00 p.m. may not be returned until the following business day.  We are closed weekends and all major holidays.  You do have access to a nurse 24-7, just call the main number to the clinic [phone] and do not press any options, hold on the line and a nurse will answer the phone.    For prescription refill requests, have your pharmacy contact our office and  allow 72 hours.    Masks are optional in the cancer centers. If you would like for your care team to wear a mask while they are taking care of you, please let them know. You may have one support person who is at least 80 years old accompany you for your appointments.

## 2022-05-14 NOTE — Progress Notes (Signed)
Candace Lewis, Candace Lewis [id number]   CLINIC:  Medical Oncology/Hematology  PCP:  Candace Sill, NP [address] [place] [address] [phone]   REASON FOR VISIT:  Follow-up for high-grade right breast DCIS, ER/PR positive  PRIOR THERAPY: 1.  Right breast lumpectomy on 12/15/2021 2.  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   01/19/2014 PET scan   Tongue mass with 2 level II Right neck nodes T2N2b, Stage IVA   02/08/2014 - 04/03/2014 Radiation Therapy   Initiation of radiation therapy with 70 Gy delivered to the right base of tongue/tonsil 63 Gy to high risk nodal echelons, 56 Gy to the intermediate r base of tongue/tonsil/bilateral neck nodes   02/08/2014 - 03/15/2014 Chemotherapy   weekly cisplatin, only 4 doses given, severe toxicity with neutropenia, dehydration, nausea, vomiting, hospitalization and obstipation   10/24/2015 PET scan   low level non malig range hyper metab in L apical sub solid pulm nodule, suspicious for low grade adeno. diffuse tongue hypermetab without CT correlation   10/28/2015 Procedure   Bronchoscopy navigation, electromagnetic navigation, bronchoscopy with biopsy of LUL lung lesion, placement of fiducial markers X 3   10/28/2015 Pathology Results   Scant benign lung tissue, no tumor seen     CANCER STAGING:  Cancer Staging  Ductal carcinoma in situ (DCIS) of right breast Staging form: Breast, AJCC 8th Edition - Clinical stage from 11/25/2021: Stage 0 (cTis (DCIS), cN0, cM0, ER+, PR+, HER2: Not Assessed) - Unsigned    INTERVAL HISTORY:  Candace Lewis 82 y.o. female seen for follow-up of right breast DCIS.  She reports some pain in the right breast region since surgery.  Denies any hot flashes or  musculoskeletal symptoms.  Complains of lack of sleep and is waking up at 2 AM and cannot go back to sleep until 5 AM.    REVIEW OF SYSTEMS:  Review of Systems  Neurological:  Positive for headaches.  Psychiatric/Behavioral:  Positive for sleep disturbance. The patient is nervous/anxious.   All other systems reviewed and are negative.    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 Britta Mccreedy: LA Class B esophagitis. PEG tube in place. erythema in proximal esophagus.    ESOPHAGOGASTRODUODENOSCOPY (EGD) WITH PROPOFOL N/A 03/11/2017   Web in upper third of esophagus s/p dilation with scope passage, small hiatal hernia, normal duodenum, no specimens collected   FUDUCIAL PLACEMENT N/A 10/28/2015   Procedure: PLACEMENT OF FUDUCIAL TIMES THREE; LEFT UPPER  LOBE BIOPSY;  Surgeon: Grace Isaac, MD;  Location: [facility] OR;  Service: Thoracic;  Laterality: N/A;   MASTECTOMY, PARTIAL Left 1984   no cancer, fibrocystic breast disease   SKIN SURGERY  1960   moles removed from various areas per patient report   TONSILLECTOMY  1950   per patient report   [place]   vaginal hyst per patient report   [place] N/A 10/28/2015   Procedure: VIDEO  BRONCHOSCOPY WITH ENDOBRONCHIAL NAVIGATION;  Surgeon: Grace Isaac, MD;  Location: [facility];  Service: Thoracic;  Laterality: N/A;     SOCIAL HISTORY:  Social History   Socioeconomic History   Marital status: Widowed    Spouse name: Not on file   Number of children: 2   Years of education: Not on file   Highest education level: Not on file  Occupational History   Occupation: retired  Tobacco Use   Smoking status: Former    Packs/day: 1.00    Years: 23.00    Total pack years: 23.00    Types: Cigarettes    Quit date: 05/03/1982    Years since quitting: 40.0   Smokeless tobacco: Never  Vaping Use   Vaping Use: Never used  Substance and Sexual Activity   Alcohol use: No    Alcohol/week: 0.0 standard drinks of alcohol   Drug use: No   Sexual activity: Never    Birth control/protection: None  Other Topics Concern   Not on file  Social History Narrative   Not on file   Social Determinants of Health   Financial Resource Strain: Not on file  Food Insecurity: Not on file  Transportation Needs: Not on file  Physical Activity: Not on file  Stress: Not on file  Social Connections: Not on file  Intimate Partner Violence: Not on file    FAMILY HISTORY:  Family History  Problem Relation Age of Onset   Heart disease Mother     CURRENT MEDICATIONS:  Outpatient Encounter Medications as of 05/14/2022  Medication Sig   anastrozole (ARIMIDEX) 1 MG tablet Take 1 tablet (1 mg total) by mouth daily. Dissolve in water and drink once daily.   levothyroxine (SYNTHROID) 88 MCG tablet Take 88 mcg by mouth daily before breakfast.   metoprolol tartrate (LOPRESSOR) 25 MG tablet Take 12.5 mg by mouth 2 (two) times daily.   omeprazole (PRILOSEC OTC) 20 MG tablet 1 tablet 30 minutes before morning meal Orally Once a day for 90 days   rosuvastatin (CRESTOR) 20 MG tablet Take 20 mg by mouth at bedtime.   trolamine salicylate (ASPERCREME) 10 % cream Apply 1 Application topically as needed for  muscle pain.   No facility-administered encounter medications on file as of 05/14/2022.    ALLERGIES:  Allergies  Allergen Reactions   Adhesive [Tape] Rash    Reports rash and blistering if any adhesive tape   Latex Rash    Rash and skin blistering per patient report     PHYSICAL EXAM:  ECOG Performance status: 1  Vitals:   05/14/22 1523  BP: (!) 122/95  Pulse: 75  Resp: 16  Temp: (!) 97.5 F (36.4 C)  SpO2: 99%   Filed Weights   05/14/22 1523  Weight: 182 lb 8.7 oz (82.8 kg)   Physical Exam Vitals reviewed.  Constitutional:      Appearance: Normal appearance.  Cardiovascular:     Rate and Rhythm: Normal rate and  regular rhythm.     Heart sounds: Normal heart sounds.  Pulmonary:     Effort: Pulmonary effort is normal.     Breath sounds: Normal breath sounds.  Abdominal:     Palpations: Abdomen is soft.  Neurological:     Mental Status: She is alert.  Psychiatric:        Mood and Affect: Mood normal.        Behavior: Behavior normal.    Breast exam: Right breast recent lumpectomy scar in the upper outer quadrant is stable.  There is tenderness in the whole right breast.  No palpable adenopathy or masses.  LABORATORY DATA:  I have reviewed the labs as listed.  CBC    Component Value Date/Time   WBC 3.3 (L) 05/07/2022 1345   RBC 4.60 05/07/2022 1345   HGB 13.8 05/07/2022 1345   HCT 40.5 05/07/2022 1345   PLT 148 (L) 05/07/2022 1345   MCV 88.0 05/07/2022 1345   MCH 30.0 05/07/2022 1345   MCHC 34.1 05/07/2022 1345   RDW 13.7 05/07/2022 1345   LYMPHSABS 0.6 (L) 05/07/2022 1345   MONOABS 0.3 05/07/2022 1345   EOSABS 0.1 05/07/2022 1345   BASOSABS 0.0 05/07/2022 1345      Latest Ref Rng & Units 05/07/2022    1:45 PM 11/25/2021    9:37 AM 11/21/2020   12:36 PM  CMP  Glucose 70 - 99 mg/dL 111  111  114   BUN 8 - 23 mg/dL _0 Creatinine 0.44 - 1.00 mg/dL 0.87  0.92  0.85   Sodium 135 - 145 mmol/L 138  138  136   Potassium 3.5 - 5.1 mmol/L 3.8   4.1  3.9   Chloride 98 - 111 mmol/L 103  107  103   CO2 22 - 32 mmol/L _1 Calcium 8.9 - 10.3 mg/dL 9.3  9.3  9.3   Total Protein 6.5 - 8.1 g/dL 7.2  7.2  7.0   Total Bilirubin 0.3 - 1.2 mg/dL 0.8  0.9  0.5   Alkaline Phos 38 - 126 U/L 77  69  67   AST 15 - 41 U/L _2 ALT 0 - 44 U/L _3 DIAGNOSTIC IMAGING:  I have independently reviewed the scans and discussed with the patient.  ASSESSMENT: Right breast DCIS, high-grade: - She felt a lump in the right breast UOQ 2 months ago with some soreness. - Mammogram (10/28/2021) calcifications in the UOQ right breast. - Biopsy (11/17/2021): High-grade DCIS, solid type with comedonecrosis.  Negative for invasive carcinoma.  Microcalcification present.  DCIS measures 6 mm in greatest linear extent.  ER 60% positive and PR 1% positive. - She had history of fibrocystic disease and had previously cyst removals x3 which were benign. - Right breast lumpectomy on 12/15/2021 - Pathology: Focus of microinvasive carcinoma, less than 1 mm arising in a background of high-grade DCIS with necrosis and calcifications.  Margins negative for invasive carcinoma.  DCIS is focally less than 1 mm from posterior margin.  ER 60% and PR 1% positive.  PT49m, PNx. - XRT to the right breast from 02/02/2022 through 02/13/2022 - Anastrozole started 01/06/2022    Social/family history: - She lives at home by herself.  She is seen with her granddaughter.  She is independent of ADLs and IADLs.  She did construction/electrical work.  Quit smoking in 1984. -  Maternal grandfather had lung cancer.  3.  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    PLAN:  1.  Right breast DCIS with microinvasion, ER/PR positive: -Reviewed labs from 05/07/2022 with normal LFTs, creatinine.  Mild leukopenia and thrombocytopenia stable. - Left breast diagnostic mammogram (04/14/2022): (With  no findings of malignancy. - Breast exam today shows generalized tenderness in the right breast with no palpable masses.  No palpable adenopathy. - Reviewed labs from 05/07/2022 which showed normal LFTs and CBC. - She will have bilateral diagnostic mammogram in June 2024. - We will schedule her for follow-up in 6 months.  I have told her to try melatonin 5 mg around 6 PM for sleep problem.  2.  Osteopenia (DEXA 12/25/2021 T-score -1.7): - Vitamin D level is 35. - Continue calcium and vitamin D supplements.  3.  Nutrition: - She is not able to eat any solid food since she completed chemoradiation in December 2015. - Continue 4 ensures with 8 ounces of milk per day.  Weight is stable.  4.  Hypothyroidism: - She is on Synthroid 88 mcg daily.  Last TSH is 6.1 on 01/13/2022. - Will repeat TSH in 6 months.      Orders placed this encounter:  Orders Placed This Encounter  Procedures   MM DIAG BREAST TOMO BILATERAL   CBC with Differential/Platelet   Comprehensive metabolic panel   TSH      Derek Jack, MD [place] [phone]

## 2022-05-28 ENCOUNTER — Encounter: Payer: Self-pay | Admitting: Internal Medicine

## 2022-05-28 ENCOUNTER — Ambulatory Visit (INDEPENDENT_AMBULATORY_CARE_PROVIDER_SITE_OTHER): Payer: Medicare Other | Admitting: Internal Medicine

## 2022-05-28 VITALS — BP 103/70 | HR 80 | Ht 67.5 in | Wt 183.2 lb

## 2022-05-28 DIAGNOSIS — G5602 Carpal tunnel syndrome, left upper limb: Secondary | ICD-10-CM

## 2022-05-28 DIAGNOSIS — F5101 Primary insomnia: Secondary | ICD-10-CM

## 2022-05-28 DIAGNOSIS — M5136 Other intervertebral disc degeneration, lumbar region: Secondary | ICD-10-CM

## 2022-05-28 DIAGNOSIS — R221 Localized swelling, mass and lump, neck: Secondary | ICD-10-CM

## 2022-05-28 DIAGNOSIS — D0511 Intraductal carcinoma in situ of right breast: Secondary | ICD-10-CM

## 2022-05-28 DIAGNOSIS — I10 Essential (primary) hypertension: Secondary | ICD-10-CM

## 2022-05-28 DIAGNOSIS — K2100 Gastro-esophageal reflux disease with esophagitis, without bleeding: Secondary | ICD-10-CM | POA: Diagnosis not present

## 2022-05-28 DIAGNOSIS — E782 Mixed hyperlipidemia: Secondary | ICD-10-CM

## 2022-05-28 DIAGNOSIS — R1319 Other dysphagia: Secondary | ICD-10-CM

## 2022-05-28 DIAGNOSIS — C029 Malignant neoplasm of tongue, unspecified: Secondary | ICD-10-CM

## 2022-05-28 DIAGNOSIS — M51369 Other intervertebral disc degeneration, lumbar region without mention of lumbar back pain or lower extremity pain: Secondary | ICD-10-CM

## 2022-05-28 MED ORDER — METHYLPREDNISOLONE ACETATE 80 MG/ML IJ SUSP: 80.0000 mg | Freq: Once | INTRAMUSCULAR | Status: DC

## 2022-05-28 MED ORDER — TRAZODONE HCL 50 MG PO TABS: 25.0000 mg | ORAL_TABLET | Freq: Every evening | ORAL | 3 refills | Status: DC | PRN

## 2022-05-28 MED ORDER — METHYLPREDNISOLONE ACETATE 80 MG/ML IJ SUSP
40.0000 mg | Freq: Once | INTRAMUSCULAR | Status: AC
  Administered 2022-05-28: 40 mg via INTRAMUSCULAR

## 2022-05-28 MED ORDER — METOPROLOL SUCCINATE ER 25 MG PO TB24: 25.0000 mg | ORAL_TABLET | Freq: Every day | ORAL | 3 refills | Status: DC

## 2022-05-28 NOTE — Progress Notes (Signed)
New Patient Office Visit  Subjective:  Patient ID: Candace Lewis, female    DOB: 1940/09/27  Age: 82 y.o. MRN: [id number]  CC:  Chief Complaint  Patient presents with   Establish Care    Patient is having trouble sleeping. She is also wanting to discuss the effects of breast cancer. Painful glands in her throat     HPI Candace Lewis is a 82 y.o. female with past medical history of breast ca., tongue ca., HTN, HLD, hypothyroidism and GERD who presents for establishing care.  HTN: BP is well-controlled. Takes medication - metoprolol tartrate 25 mg QD (?) regularly. Patient denies headache, dizziness, chest pain, dyspnea or palpitations. She takes Crestor for HLD.  GERD: She takes Omeprazole for GERD. Denies any nausea or vomiting.  She has chronic dysphagia and can only tolerate liquid.  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  She follows up with Dr. Delton Coombes for h/o breast ca., s/p right breast lumpectomy and radiation. She is taking Anastrozole currently.  She has history of tongue cancer, and had chemoradiation in the past.  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  She currently reports painful bumps in her neck, which is intermittent.  Of note, she also reports worsening of neck pain upon bending (?).  Denies any recent injury.  She has history of hypothyroidism, and takes levothyroxine 75 mcg daily.  Denies any recent change in weight or appetite.  She reports chronic insomnia.  She has spells of anxiety at nighttime.  Denies any anhedonia, SI or HI currently.  She has tried melatonin without much relief.  She complains of chronic low back pain, worse with movement.  She has had steroid injections in the past.  She denies any numbness or tingling of the LE currently.  Denies any saddle anesthesia, urinary or stool incontinence.  She also reports numbness of the  first 4 digits of the left hand, which is intermittent.  Denies any recent injury.  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 Britta Mccreedy: LA Class B esophagitis. PEG tube in place. erythema in proximal esophagus.    ESOPHAGOGASTRODUODENOSCOPY (EGD) WITH PROPOFOL N/A 03/11/2017   Web in upper third of esophagus s/p dilation with scope passage, small hiatal hernia, normal duodenum, no specimens collected   FUDUCIAL PLACEMENT N/A 10/28/2015   Procedure: PLACEMENT OF FUDUCIAL TIMES THREE; LEFT UPPER LOBE BIOPSY;  Surgeon: Grace Isaac, MD;  Location: [place];  Service:  Thoracic;  Laterality: N/A;   MASTECTOMY, PARTIAL Left 1984   no cancer, fibrocystic breast disease   SKIN SURGERY  1960   moles removed from various areas per patient report   TONSILLECTOMY  1950   per patient report   [place]   vaginal hyst per patient report   VIDEO BRONCHOSCOPY WITH ENDOBRONCHIAL NAVIGATION N/A 10/28/2015   Procedure: VIDEO BRONCHOSCOPY WITH ENDOBRONCHIAL NAVIGATION;  Surgeon: Grace Isaac, MD;  Location: [facility];  Service: Thoracic;  Laterality: N/A;    Family History  Problem Relation Age  of Onset   Heart disease Mother     Social History   Socioeconomic History   Marital status: Widowed    Spouse name: Not on file   Number of children: 2   Years of education: Not on file   Highest education level: Not on file  Occupational History   Occupation: retired  Tobacco Use   Smoking status: Former    Packs/day: 1.00    Years: 23.00    Total pack years: 23.00    Types: Cigarettes    Quit date: 05/03/1982    Years since quitting: 40.0   Smokeless tobacco: Never  Vaping Use   Vaping Use: Never used  Substance and Sexual Activity   Alcohol use: No    Alcohol/week: 0.0 standard drinks of alcohol   Drug use: No   Sexual activity: Never    Birth control/protection: None  Other Topics Concern   Not on file  Social History Narrative   Not on file   Social Determinants of Health   Financial Resource Strain: Not on file  Food Insecurity: Not on file  Transportation Needs: Not on file  Physical Activity: Not on file  Stress: Not on file  Social Connections: Not on file  Intimate Partner Violence: Not on file    ROS Review of Systems  Constitutional:  Negative for chills and fever.  HENT:  Negative for congestion, sinus pressure, sinus pain and sore throat.   Eyes:  Negative for pain and discharge.  Respiratory:  Negative for cough and shortness of breath.   Cardiovascular:  Negative for chest pain and palpitations.  Gastrointestinal:  Negative for abdominal pain, diarrhea, nausea and vomiting.  Endocrine: Negative for polydipsia and polyuria.  Genitourinary:  Negative for dysuria and hematuria.  Musculoskeletal:  Positive for neck pain. Negative for neck stiffness.  Skin:  Negative for rash.  Neurological:  Negative for dizziness and weakness.  Psychiatric/Behavioral:  Positive for sleep disturbance. Negative for agitation and behavioral problems.     Objective:   Today's Vitals: BP 103/70 (BP Location: Right Arm, Patient Position: Sitting, Cuff Size:  Large)   Pulse 80   Ht 5' 7.5" (1.715 m)   Wt 183 lb 3.2 oz (83.1 kg)   SpO2 97%   BMI 28.27 kg/m   Physical Exam Vitals reviewed.  Constitutional:      General: She is not in acute distress.    Appearance: She is not diaphoretic.  HENT:     Head: Normocephalic and atraumatic.     Nose: Nose normal.     Mouth/Throat:     Mouth: Mucous membranes are moist.  Eyes:     General: No scleral icterus.    Extraocular Movements: Extraocular movements intact.  Neck:     Comments: Hardening of right submandibular area and slightly tender masses in the right side of neck, ?  Cervical LAD Cardiovascular:  Rate and Rhythm: Normal rate and regular rhythm.     Heart sounds: Normal heart sounds. No murmur heard. Pulmonary:     Breath sounds: Normal breath sounds. No wheezing or rales.  Musculoskeletal:     Cervical back: Neck supple. No tenderness.     Right lower leg: No edema.     Left lower leg: No edema.  Skin:    General: Skin is warm.     Findings: No rash.  Neurological:     General: No focal deficit present.     Mental Status: She is alert and oriented to person, place, and time.  Psychiatric:        Mood and Affect: Mood normal.        Behavior: Behavior normal.     Assessment & Plan:   Problem List Items Addressed This Visit       Cardiovascular and Mediastinum   Essential hypertension - Primary    BP Readings from Last 1 Encounters:  05/28/22 103/70  Well-controlled with Metoprolol Switched from Lopressor to Toprol as she takes it QD Counseled for compliance with the medications       Relevant Medications   metoprolol succinate (TOPROL-XL) 25 MG 24 hr tablet     Digestive   Malignant neoplasm of tongue (HCC)    S/p chemoradiation Followed by Oncology Has chronic dysphagia from radiation to the neck area Check US neck as she has painful lumps in neck      Relevant Orders   US Soft Tissue Head/Neck (NON-THYROID)   Esophageal dysphagia    Due to  radiation in the neck area Only tolerates liquid diet Has had EGD, but had fibrosis, could not be dilated      GERD (gastroesophageal reflux disease)    Well controlled with omeprazole        Nervous and Auditory   Carpal tunnel syndrome of left wrist    Reports numbness of the first 4 digits of left hand Advised to wear wrist brace at nighttime      Relevant Medications   traZODone (DESYREL) 50 MG tablet     Musculoskeletal and Integument   DDD (degenerative disc disease), lumbar    MRI of lumbar spine reviewed Chronic low back pain Has had steroid injections Depo-Medrol IM today Tylenol arthritis as needed for pain Followed by orthopedic surgery        Other   Ductal carcinoma in situ (DCIS) of right breast    Right breast lumpectomy on 12/15/2021 XRT to the right breast from 02/02/2022 through 02/13/2022 Anastrozole started 01/06/2022 Followed by Oncology      Hyperlipidemia    On Crestor      Relevant Medications   metoprolol succinate (TOPROL-XL) 25 MG 24 hr tablet   Primary insomnia    Likely due to GAD Added trazodone as needed for now Sleep hygiene material provided      Relevant Medications   traZODone (DESYREL) 50 MG tablet   Neck mass   Relevant Orders   US Soft Tissue Head/Neck (NON-THYROID)    Outpatient Encounter Medications as of 05/28/2022  Medication Sig   metoprolol succinate (TOPROL-XL) 25 MG 24 hr tablet Take 1 tablet (25 mg total) by mouth daily.   traZODone (DESYREL) 50 MG tablet Take 0.5-1 tablets (25-50 mg total) by mouth at bedtime as needed for sleep.   anastrozole (ARIMIDEX) 1 MG tablet Take 1 tablet (1 mg total) by mouth daily. Dissolve in water and drink once daily.  levothyroxine (SYNTHROID) 88 MCG tablet Take 88 mcg by mouth daily before breakfast.   omeprazole (PRILOSEC OTC) 20 MG tablet 1 tablet 30 minutes before morning meal Orally Once a day for 90 days   rosuvastatin (CRESTOR) 20 MG tablet Take 20 mg by mouth at bedtime.    trolamine salicylate (ASPERCREME) 10 % cream Apply 1 Application topically as needed for muscle pain.   [DISCONTINUED] metoprolol tartrate (LOPRESSOR) 25 MG tablet Take 12.5 mg by mouth 2 (two) times daily.   [EXPIRED] methylPREDNISolone acetate (DEPO-MEDROL) injection 40 mg    [DISCONTINUED] methylPREDNISolone acetate (DEPO-MEDROL) injection 80 mg    No facility-administered encounter medications on file as of 05/28/2022.    Follow-up: Return in about 4 weeks (around 06/25/2022) for HTN and insomnia.   Lindell Spar, MD

## 2022-05-28 NOTE — Assessment & Plan Note (Addendum)
BP Readings from Last 1 Encounters:  05/28/22 103/70   Well-controlled with Metoprolol Switched from Lopressor to Toprol as she takes it QD Counseled for compliance with the medications

## 2022-05-28 NOTE — Patient Instructions (Addendum)
Please start taking Metoprolol succinate 25 mg once daily instead of Metoprolol tartrate.  Please start taking Trazodone as prescribed for insomnia.  Please maintain simple sleep hygiene. - Maintain dark and non-noisy environment in the bedroom. - Please use the bedroom for sleep and sexual activity only. - Do not use electronic devices in the bedroom. - Please take dinner at least 2 hours before bedtime. - Please avoid caffeinated products in the evening, including coffee, soft drinks. - Please try to maintain the regular sleep-wake cycle - Go to bed and wake up at the same time.    Please use wrist brace at nighttime for numbness of hand.

## 2022-05-28 NOTE — Assessment & Plan Note (Signed)
Right breast lumpectomy on 12/15/2021 XRT to the right breast from 02/02/2022 through 02/13/2022 Anastrozole started 01/06/2022 Followed by Oncology

## 2022-05-28 NOTE — Assessment & Plan Note (Signed)
On Crestor

## 2022-05-28 NOTE — Assessment & Plan Note (Signed)
Due to radiation in the neck area Only tolerates liquid diet Has had EGD, but had fibrosis, could not be dilated

## 2022-05-28 NOTE — Assessment & Plan Note (Addendum)
MRI of lumbar spine reviewed Chronic low back pain Has had steroid injections Depo-Medrol IM today Tylenol arthritis as needed for pain Followed by orthopedic surgery

## 2022-05-28 NOTE — Assessment & Plan Note (Signed)
Well controlled with omeprazole. 

## 2022-05-28 NOTE — Assessment & Plan Note (Signed)
Reports numbness of the first 4 digits of left hand Advised to wear wrist brace at nighttime 

## 2022-05-28 NOTE — Assessment & Plan Note (Addendum)
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

## 2022-05-28 NOTE — Assessment & Plan Note (Signed)
Likely due to GAD Added trazodone as needed for now Sleep hygiene material provided

## 2022-06-02 ENCOUNTER — Ambulatory Visit (HOSPITAL_COMMUNITY)
Admission: RE | Admit: 2022-06-02 | Discharge: 2022-06-02 | Disposition: A | Discharge status: Home / Self Care | Payer: Medicare Other | Source: Ambulatory Visit | Attending: Internal Medicine | Admitting: Internal Medicine

## 2022-06-02 DIAGNOSIS — C029 Malignant neoplasm of tongue, unspecified: Secondary | ICD-10-CM | POA: Insufficient documentation

## 2022-06-02 DIAGNOSIS — R221 Localized swelling, mass and lump, neck: Secondary | ICD-10-CM | POA: Diagnosis present

## 2022-06-05 ENCOUNTER — Ambulatory Visit: Payer: Medicare Other | Admitting: Internal Medicine

## 2022-06-11 ENCOUNTER — Ambulatory Visit: Payer: Medicare Other | Attending: Internal Medicine | Admitting: Internal Medicine

## 2022-06-11 ENCOUNTER — Encounter: Payer: Self-pay | Admitting: Internal Medicine

## 2022-06-11 VITALS — BP 136/72 | HR 92 | Ht 67.5 in | Wt 183.4 lb

## 2022-06-11 DIAGNOSIS — Z136 Encounter for screening for cardiovascular disorders: Secondary | ICD-10-CM

## 2022-06-11 DIAGNOSIS — R0789 Other chest pain: Secondary | ICD-10-CM | POA: Diagnosis not present

## 2022-06-11 MED ORDER — METOPROLOL TARTRATE 25 MG PO TABS: 25.0000 mg | ORAL_TABLET | Freq: Two times a day (BID) | ORAL | 3 refills | Status: DC

## 2022-06-11 NOTE — Patient Instructions (Signed)
Medication Instructions:  Your physician has recommended you make the following change in your medication:  - Stop Metoprolol Succinate    Labwork: None  Testing/Procedures: Calcium Score- Self Pay- $99.00  Follow-Up: Follow up in 1 year with Dr. Dellia Cloud  Any Other Special Instructions Will Be Listed Below (If Applicable).     If you need a refill on your cardiac medications before your next appointment, please call your pharmacy.

## 2022-06-11 NOTE — Progress Notes (Signed)
Cardiology Office Note  Date: 06/11/2022   ID: Candace Lewis, Candace Lewis 06-Jul-1940, MRN [id number]  PCP:  Lindell Spar, MD  Cardiologist:  Chalmers Guest, MD Electrophysiologist:  None   Reason for Office Visit: Follow-up visit for palpitations   History of Present Illness: Candace Lewis is a 82 y.o. female known to have HTN (no longer taking medications), history of throat cancer, newly diagnosed breast cancer in 2023, palpitations presented to cardiology clinic for follow-up visit.  She was initially referred to cardiology clinic for evaluation of palpitations and was on Toprol with adequate symptom relief.  She is here for follow-up visit. She reported having diagnosed with breast cancer in 2023 and underwent diagnostic/therapeutic interventions.  She lost her husband 3 years ago, grieving and has been dealing with the diagnosis of breast cancer all by herself. She was stressed out about it and also she was stressed out in general about the inflation and the price increase. She started to notice chest pains in the left side of her chest migrating to right side of chest, lasting for 10 to 15 minutes, occurs with both rest or exertion and resolve spontaneously. Started noticing them 3 months ago and happening few times per week.  Has frequent palpitations which is controlled by metoprolol tartrate and not succinate.  Otherwise denies any dizziness, lightheadedness, syncope.  Patient had history of throat cancer and new onset breast cancer for which she underwent radiation.  Currently she has dysphagia and on liquid diet only. Unable to dilate her esophagus due to damage from radiation.  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 Britta Mccreedy: LA Class B esophagitis. PEG tube in place. erythema in proximal esophagus.    ESOPHAGOGASTRODUODENOSCOPY (EGD) WITH PROPOFOL N/A 03/11/2017   Web in upper third of esophagus s/p dilation with scope passage, small hiatal hernia, normal duodenum, no specimens collected   FUDUCIAL PLACEMENT N/A 10/28/2015   Procedure: PLACEMENT OF FUDUCIAL TIMES THREE; LEFT UPPER LOBE BIOPSY;  Surgeon: Grace Isaac, MD;  Location: [place];  Service: Thoracic;  Laterality: N/A;   MASTECTOMY, PARTIAL Left 1984   no cancer, fibrocystic breast disease   SKIN SURGERY  1960   moles removed from various areas per patient report   TONSILLECTOMY  1950   per patient report   [place]   vaginal hyst per patient report   [place] N/A 10/28/2015   Procedure: VIDEO BRONCHOSCOPY WITH ENDOBRONCHIAL NAVIGATION;  Surgeon: Grace Isaac, MD;  Location: [facility];  Service: Thoracic;  Laterality: N/A;    Current Outpatient Medications  Medication Sig Dispense Refill   anastrozole (ARIMIDEX) 1 MG tablet  Take 1 tablet (1 mg total) by mouth daily. Dissolve in water and drink once daily. 30 tablet 6   levothyroxine (SYNTHROID) 88 MCG tablet Take 88 mcg by mouth daily before breakfast.     metoprolol tartrate (LOPRESSOR) 25 MG tablet Take 25 mg by mouth 2 (two) times daily.     omeprazole (PRILOSEC OTC) 20 MG tablet 1 tablet 30 minutes before morning meal Orally Once a day for 90 days     rosuvastatin (CRESTOR) 20 MG tablet Take 20 mg by mouth at  bedtime.     traZODone (DESYREL) 50 MG tablet Take 0.5-1 tablets (25-50 mg total) by mouth at bedtime as needed for sleep. 30 tablet 3   trolamine salicylate (ASPERCREME) 10 % cream Apply 1 Application topically as needed for muscle pain.     metoprolol succinate (TOPROL-XL) 25 MG 24 hr tablet Take 1 tablet (25 mg total) by mouth daily. (Patient not taking: Reported on 06/11/2022) 30 tablet 3   No current facility-administered medications for this visit.   Allergies:  Adhesive [tape] and Latex   Social History: The patient  reports that she quit smoking about 40 years ago. Her smoking use included cigarettes. She has a 23.00 pack-year smoking history. She has never used smokeless tobacco. She reports that she does not drink alcohol and does not use drugs.   Family History: The patient's family history includes Heart disease in her mother.   ROS:  Please see the history of present illness. Otherwise, complete review of systems is positive for none.  All other systems are reviewed and negative.   Physical Exam: VS:  BP 136/72   Pulse 92   Ht 5' 7.5" (1.715 m)   Wt 183 lb 6.4 oz (83.2 kg)   SpO2 99%   BMI 28.30 kg/m , BMI Body mass index is 28.3 kg/m.  Wt Readings from Last 3 Encounters:  06/11/22 183 lb 6.4 oz (83.2 kg)  05/28/22 183 lb 3.2 oz (83.1 kg)  05/14/22 182 lb 8.7 oz (82.8 kg)    General: Patient appears comfortable at rest. HEENT: Conjunctiva and lids normal, oropharynx clear with moist mucosa. Neck: Supple, no elevated JVP or carotid bruits, no thyromegaly. Lungs: Clear to auscultation, nonlabored breathing at rest. Cardiac: Regular rate and rhythm, no S3 or significant systolic murmur, no pericardial rub. Abdomen: Soft, nontender, no hepatomegaly, bowel sounds present, no guarding or rebound. Extremities: No pitting edema, distal pulses 2+. Skin: Warm and dry. Musculoskeletal: No kyphosis. Neuropsychiatric: Alert and oriented x3, affect grossly appropriate.  ECG:  An  ECG dated 06/11/2022 was personally reviewed today and demonstrated:  Normal sinus rhythm  Recent Labwork: 11/25/2021: TSH 6.506 05/07/2022: ALT 20; AST 23; BUN 28; Creatinine, Ser 0.87; Hemoglobin 13.8; Platelets 148; Potassium 3.8; Sodium 138  No results found for: "CHOL", "TRIG", "HDL", "CHOLHDL", "VLDL", "LDLCALC", "LDLDIRECT"  Other Studies Reviewed Today: Echocardiogram from 2019 Normal LVEF No valve abnormalities  Assessment and Plan: Patient is a 82 year old F known to have HTN (off antihypertensive medications), history of thyroid cancer, new onset of breast cancer in 2023 s/p treatment, palpitations from stress presented to cardiology clinic for follow-up visit.  # Chest pain, atypical -Obtain CT coronary calcium scoring  # Palpitations likely secondary to stress -Patient reported palpitations and adequate symptom relief with metoprolol tartrate 25 mg twice daily. Discontinue metoprolol succinate and start metoprolol tartrate 25 mg twice daily.  I have spent a total of 20 minutes with patient reviewing chart, EKGs, labs and examining patient  as well as establishing an assessment and plan that was discussed with the patient.  > 50% of time was spent in direct patient care.     Medication Adjustments/Labs and Tests Ordered: Current medicines are reviewed at length with the patient today.  Concerns regarding medicines are outlined above.   Tests Ordered: No orders of the defined types were placed in this encounter.   Medication Changes: No orders of the defined types were placed in this encounter.   Disposition:  Follow up  1 year  Signed, Mystery Schrupp Fidel Levy, MD, 06/11/2022 2:20 PM    [facility] [address], [place], [place] [address]

## 2022-06-12 ENCOUNTER — Telehealth: Payer: Self-pay | Admitting: Internal Medicine

## 2022-06-12 NOTE — Telephone Encounter (Signed)
Patient called needs refill no longer gets from other provider   levothyroxine (SYNTHROID) 88 MCG tablet   Pharmacy: [facility]

## 2022-06-15 ENCOUNTER — Other Ambulatory Visit: Payer: Self-pay | Admitting: Internal Medicine

## 2022-06-15 DIAGNOSIS — E039 Hypothyroidism, unspecified: Secondary | ICD-10-CM

## 2022-06-15 MED ORDER — LEVOTHYROXINE SODIUM 88 MCG PO TABS: 88.0000 ug | ORAL_TABLET | Freq: Every day | ORAL | 1 refills | Status: DC

## 2022-06-16 ENCOUNTER — Telehealth: Payer: Self-pay | Admitting: Internal Medicine

## 2022-06-16 NOTE — Telephone Encounter (Signed)
Patient called said her flexeril was not working can something else be called into her pharmacy

## 2022-06-16 NOTE — Telephone Encounter (Signed)
Spoke to patient , she stated that she has not called this morning and she does not take this kind of medication

## 2022-06-29 ENCOUNTER — Ambulatory Visit (INDEPENDENT_AMBULATORY_CARE_PROVIDER_SITE_OTHER): Payer: Medicare Other | Admitting: Internal Medicine

## 2022-06-29 ENCOUNTER — Encounter: Payer: Self-pay | Admitting: Internal Medicine

## 2022-06-29 VITALS — BP 124/80 | HR 84 | Ht 67.5 in | Wt 183.4 lb

## 2022-06-29 DIAGNOSIS — I10 Essential (primary) hypertension: Secondary | ICD-10-CM

## 2022-06-29 DIAGNOSIS — E039 Hypothyroidism, unspecified: Secondary | ICD-10-CM | POA: Diagnosis not present

## 2022-06-29 DIAGNOSIS — G5602 Carpal tunnel syndrome, left upper limb: Secondary | ICD-10-CM

## 2022-06-29 DIAGNOSIS — F5101 Primary insomnia: Secondary | ICD-10-CM | POA: Diagnosis not present

## 2022-06-29 MED ORDER — AMITRIPTYLINE HCL 10 MG PO TABS: 10.0000 mg | ORAL_TABLET | Freq: Every day | ORAL | 0 refills | Status: DC

## 2022-06-29 NOTE — Assessment & Plan Note (Addendum)
BP Readings from Last 1 Encounters:  06/29/22 124/80   Well-controlled with Metoprolol 25 mg BID Tried to switch from Lopressor to Toprol, but did not help with palpitations - placed back on Lopressor Counseled for compliance with the medications

## 2022-06-29 NOTE — Progress Notes (Signed)
Established Patient Office Visit  Subjective:  Patient ID: Candace Lewis, female    DOB: 1940/12/05  Age: 82 y.o. MRN: [id number]  CC:  Chief Complaint  Patient presents with   Hypertension    Four week follow up for hypertension and insomnia     HPI Candace Lewis is a 82 y.o. female with past medical history of breast ca., tongue ca., HTN, HLD, hypothyroidism and GERD who presents for f/u of her chronic medical conditions.  HTN: BP is well-controlled. Takes medication - metoprolol tartrate 25 mg BID regularly. Patient denies headache, dizziness or dyspnea. She has palpitations, but usually controlled with Metoprolol. She takes Crestor for HLD.  GERD: She takes Omeprazole for GERD. Denies any nausea or vomiting.  She has chronic dysphagia and can only tolerate liquid.  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  She has history of hypothyroidism, and takes levothyroxine 88 mcg daily.  Denies any recent change in weight or appetite.  She reports chronic insomnia.  She has spells of anxiety at nighttime.  Denies any anhedonia, SI or HI currently.  She has tried melatonin without much relief.  She was placed on trazodone in the last visit, but did not get any relief with it.     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 Britta Mccreedy: LA Class B esophagitis. PEG tube in place. erythema in proximal esophagus.    ESOPHAGOGASTRODUODENOSCOPY (EGD) WITH PROPOFOL N/A 03/11/2017   Web in upper third of esophagus s/p dilation with scope passage, small hiatal hernia, normal duodenum, no specimens collected   FUDUCIAL PLACEMENT N/A 10/28/2015   Procedure: PLACEMENT OF FUDUCIAL TIMES THREE; LEFT UPPER LOBE BIOPSY;  Surgeon: Grace Isaac, MD;  Location: [facility];  Service: Thoracic;  Laterality: N/A;   MASTECTOMY, PARTIAL Left 1984   no cancer, fibrocystic breast disease   SKIN SURGERY  1960   moles removed from various areas per patient report   TONSILLECTOMY  1950   per patient report   [place]   vaginal hyst per patient report   VIDEO BRONCHOSCOPY WITH ENDOBRONCHIAL NAVIGATION N/A 10/28/2015   Procedure: VIDEO BRONCHOSCOPY WITH ENDOBRONCHIAL NAVIGATION;  Surgeon: Grace Isaac, MD;  Location[facility];  Service: Thoracic;  Laterality: N/A;    Family History  Problem Relation Age of Onset   Heart disease Mother     Social History   Socioeconomic History   Marital status: Widowed    Spouse name: Not on file   Number of children: 2   Years of education: Not on file  Highest education level: Not on file  Occupational History   Occupation: retired  Tobacco Use   Smoking status: Former    Packs/day: 1.00    Years: 23.00    Total pack years: 23.00    Types: Cigarettes    Quit date: 05/03/1982    Years since quitting: 40.1   Smokeless tobacco: Never  Vaping Use   Vaping Use: Never used  Substance and Sexual Activity   Alcohol use: No    Alcohol/week: 0.0 standard drinks of alcohol   Drug use: No   Sexual activity: Never    Birth control/protection: None  Other Topics Concern   Not on file  Social History Narrative   Not on file   Social Determinants of Health   Financial  Resource Strain: Not on file  Food Insecurity: Not on file  Transportation Needs: Not on file  Physical Activity: Not on file  Stress: Not on file  Social Connections: Not on file  Intimate Partner Violence: Not on file    Outpatient Medications Prior to Visit  Medication Sig Dispense Refill   anastrozole (ARIMIDEX) 1 MG tablet Take 1 tablet (1 mg total) by mouth daily. Dissolve in water and drink once daily. 30 tablet 6   levothyroxine (SYNTHROID) 88 MCG tablet Take 1 tablet (88 mcg total) by mouth daily before breakfast. 90 tablet 1   metoprolol tartrate (LOPRESSOR) 25 MG tablet Take 1 tablet (25 mg total) by mouth 2 (two) times daily. 180 tablet 3   omeprazole (PRILOSEC OTC) 20 MG tablet 1 tablet 30 minutes before morning meal Orally Once a day for 90 days     rosuvastatin (CRESTOR) 20 MG tablet Take 20 mg by mouth at bedtime.     trolamine salicylate (ASPERCREME) 10 % cream Apply 1 Application topically as needed for muscle pain.     traZODone (DESYREL) 50 MG tablet Take 0.5-1 tablets (25-50 mg total) by mouth at bedtime as needed for sleep. 30 tablet 3   No facility-administered medications prior to visit.    Allergies  Allergen Reactions   Adhesive [Tape] Rash    Reports rash and blistering if any adhesive tape   Latex Rash    Rash and skin blistering per patient report    ROS Review of Systems  Constitutional:  Negative for chills and fever.  HENT:  Negative for congestion, sinus pressure, sinus pain and sore throat.   Eyes:  Negative for pain and discharge.  Respiratory:  Negative for cough and shortness of breath.   Cardiovascular:  Negative for chest pain and palpitations.  Gastrointestinal:  Negative for abdominal pain, diarrhea, nausea and vomiting.  Endocrine: Negative for polydipsia and polyuria.  Genitourinary:  Negative for dysuria and hematuria.  Musculoskeletal:  Positive for neck pain. Negative for neck stiffness.  Skin:  Negative for rash.  Neurological:   Negative for dizziness and weakness.  Psychiatric/Behavioral:  Positive for sleep disturbance. Negative for agitation and behavioral problems. The patient is nervous/anxious.       Objective:    Physical Exam Vitals reviewed.  Constitutional:      General: She is not in acute distress.    Appearance: She is not diaphoretic.  HENT:     Head: Normocephalic and atraumatic.     Nose: Nose normal.     Mouth/Throat:     Mouth: Mucous membranes are moist.  Eyes:     General: No scleral icterus.    Extraocular Movements: Extraocular movements intact.  Neck:  Comments: Hardening of right submandibular area - chronic Cardiovascular:     Rate and Rhythm: Normal rate and regular rhythm.     Heart sounds: Normal heart sounds. No murmur heard. Pulmonary:     Breath sounds: Normal breath sounds. No wheezing or rales.  Musculoskeletal:     Cervical back: Neck supple. No tenderness.     Right lower leg: No edema.     Left lower leg: No edema.  Skin:    General: Skin is warm.     Findings: No rash.  Neurological:     General: No focal deficit present.     Mental Status: She is alert and oriented to person, place, and time.  Psychiatric:        Mood and Affect: Mood normal.        Behavior: Behavior normal.     BP 124/80 (BP Location: Right Arm, Patient Position: Sitting, Cuff Size: Normal)   Pulse 84   Ht 5' 7.5" (1.715 m)   Wt 183 lb 6.4 oz (83.2 kg)   SpO2 98%   BMI 28.30 kg/m  Wt Readings from Last 3 Encounters:  06/29/22 183 lb 6.4 oz (83.2 kg)  06/11/22 183 lb 6.4 oz (83.2 kg)  05/28/22 183 lb 3.2 oz (83.1 kg)    Lab Results  Component Value Date   TSH 3.860 06/29/2022   Lab Results  Component Value Date   WBC 3.3 (L) 05/07/2022   HGB 13.8 05/07/2022   HCT 40.5 05/07/2022   MCV 88.0 05/07/2022   PLT 148 (L) 05/07/2022   Lab Results  Component Value Date   NA 142 06/29/2022   K 4.3 06/29/2022   CO2 23 06/29/2022   GLUCOSE 95 06/29/2022   BUN 24  06/29/2022   CREATININE 0.81 06/29/2022   BILITOT 0.2 06/29/2022   ALKPHOS 88 06/29/2022   AST 16 06/29/2022   ALT 12 06/29/2022   PROT 6.3 06/29/2022   ALBUMIN 4.1 06/29/2022   CALCIUM 9.4 06/29/2022   ANIONGAP 9 05/07/2022   EGFR 74 06/29/2022   No results found for: "CHOL" No results found for: "HDL" No results found for: "LDLCALC" No results found for: "TRIG" No results found for: "CHOLHDL" No results found for: "HGBA1C"    Assessment & Plan:   Problem List Items Addressed This Visit       Cardiovascular and Mediastinum   Essential hypertension - Primary    BP Readings from Last 1 Encounters:  06/29/22 124/80  Well-controlled with Metoprolol 25 mg BID Tried to switch from Lopressor to Toprol, but did not help with palpitations - placed back on Lopressor Counseled for compliance with the medications      Relevant Orders   CMP14+EGFR (Completed)     Endocrine   Hypothyroidism    Lab Results  Component Value Date   TSH 3.860 06/29/2022  On Levothyroxine 88 mcg QD      Relevant Orders   CMP14+EGFR (Completed)   TSH + free T4 (Completed)     Nervous and Auditory   Carpal tunnel syndrome of left wrist    Reports numbness of the first 4 digits of left hand Advised to wear wrist brace at nighttime      Relevant Medications   amitriptyline (ELAVIL) 10 MG tablet     Other   Primary insomnia    Likely due to GAD Added trazodone as needed, but did not help Switched to Elavil 10 mg qHS Sleep hygiene material provided  Relevant Medications   amitriptyline (ELAVIL) 10 MG tablet    Meds ordered this encounter  Medications   amitriptyline (ELAVIL) 10 MG tablet    Sig: Take 1 tablet (10 mg total) by mouth at bedtime.    Dispense:  30 tablet    Refill:  0    Discontinue Trazodone    Follow-up: Return in about 3 months (around 09/27/2022) for Annual physical.    Lindell Spar, MD

## 2022-06-29 NOTE — Patient Instructions (Signed)
Please start taking Amitriptyline for insomnia.  Please continue to take other medications as prescribed.  Please continue to follow low salt diet and ambulate as tolerated.

## 2022-06-29 NOTE — Assessment & Plan Note (Signed)
Reports numbness of the first 4 digits of left hand Advised to wear wrist brace at nighttime

## 2022-06-30 LAB — CMP14+EGFR
ALT: 12 IU/L (ref 0–32)
AST: 16 IU/L (ref 0–40)
Albumin/Globulin Ratio: 1.9 (ref 1.2–2.2)
Albumin: 4.1 g/dL (ref 3.8–4.8)
Alkaline Phosphatase: 88 IU/L (ref 44–121)
BUN/Creatinine Ratio: 30 — ABNORMAL HIGH (ref 12–28)
BUN: 24 mg/dL (ref 8–27)
Bilirubin Total: 0.2 mg/dL (ref 0.0–1.2)
CO2: 23 mmol/L (ref 20–29)
Calcium: 9.4 mg/dL (ref 8.7–10.3)
Chloride: 102 mmol/L (ref 96–106)
Creatinine, Ser: 0.81 mg/dL (ref 0.57–1.00)
Globulin, Total: 2.2 g/dL (ref 1.5–4.5)
Glucose: 95 mg/dL (ref 70–99)
Potassium: 4.3 mmol/L (ref 3.5–5.2)
Sodium: 142 mmol/L (ref 134–144)
Total Protein: 6.3 g/dL (ref 6.0–8.5)
eGFR: 74 mL/min/{1.73_m2} (ref >59)

## 2022-06-30 LAB — TSH+FREE T4
Free T4: 1.38 ng/dL (ref 0.82–1.77)
TSH: 3.86 u[IU]/mL (ref 0.450–4.500)

## 2022-07-02 ENCOUNTER — Encounter: Payer: Self-pay | Admitting: Radiology

## 2022-07-03 NOTE — Assessment & Plan Note (Signed)
Lab Results  Component Value Date   TSH 3.860 06/29/2022   On Levothyroxine 88 mcg QD

## 2022-07-03 NOTE — Assessment & Plan Note (Addendum)
Likely due to GAD Added trazodone as needed, but did not help Switched to Elavil 10 mg qHS Sleep hygiene material provided

## 2022-07-06 ENCOUNTER — Other Ambulatory Visit: Payer: Self-pay | Admitting: *Deleted

## 2022-07-06 MED ORDER — ANASTROZOLE 1 MG PO TABS: 1.0000 mg | ORAL_TABLET | Freq: Every day | ORAL | 0 refills | Status: DC

## 2022-07-06 NOTE — Telephone Encounter (Signed)
Refill for Anastrozole approved.  Patient is tolerating and is to continue therapy.

## 2022-07-21 ENCOUNTER — Ambulatory Visit (INDEPENDENT_AMBULATORY_CARE_PROVIDER_SITE_OTHER): Payer: Medicare Other | Admitting: Internal Medicine

## 2022-07-21 DIAGNOSIS — Z0000 Encounter for general adult medical examination without abnormal findings: Secondary | ICD-10-CM

## 2022-07-21 NOTE — Progress Notes (Signed)
Subjective:  This is a telephone encounter between Candace Lewis and Candace Lewis on 07/21/2022 for AWV. The visit was conducted with the patient located at home and Candace Lewis at [facility]. The patient's identity was confirmed using their DOB and current address. The patient has consented to being evaluated through a telephone encounter and understands the associated risks (an examination cannot be done and the patient may need to come in for an appointment) / benefits (allows the patient to remain at home, decreasing exposure to coronavirus).       Candace Lewis is a 82 y.o. female who presents for an Initial Medicare Annual Wellness Visit.  Review of Systems    Review of Systems  Respiratory:  Positive for cough.   All other systems reviewed and are negative.    Objective:    There were no vitals filed for this visit. There is no height or weight on file to calculate BMI.     05/14/2022    3:22 PM 01/06/2022    2:43 PM 12/15/2021    6:31 AM 12/11/2021   10:32 AM 11/25/2021    8:12 AM 11/28/2020   11:48 AM 11/22/2019   12:15 PM  Advanced Directives  Does Patient Have a Medical Advance Directive? No Yes No Yes Yes Yes No  Type of Corporate treasurer of [place];Living will  [place];Living will [place];Living will [place];Living will   Does patient want to make changes to medical advance directive?  No - Patient declined  No - Patient declined No - Patient declined No - Patient declined   Copy of [place] in Chart?  No - copy requested  No - copy requested No - copy requested No - copy requested   Would patient like information on creating a medical advance directive? No - Patient declined      No - Patient declined    Current Medications (verified) Outpatient Encounter Medications as of 07/21/2022  Medication Sig   amitriptyline (ELAVIL) 10 MG tablet Take 1 tablet (10 mg total)  by mouth at bedtime.   anastrozole (ARIMIDEX) 1 MG tablet Take 1 tablet (1 mg total) by mouth daily. Dissolve in water and drink once daily.   levothyroxine (SYNTHROID) 88 MCG tablet Take 1 tablet (88 mcg total) by mouth daily before breakfast.   metoprolol tartrate (LOPRESSOR) 25 MG tablet Take 1 tablet (25 mg total) by mouth 2 (two) times daily.   omeprazole (PRILOSEC OTC) 20 MG tablet 1 tablet 30 minutes before morning meal Orally Once a day for 90 days   rosuvastatin (CRESTOR) 20 MG tablet Take 20 mg by mouth at bedtime.   trolamine salicylate (ASPERCREME) 10 % cream Apply 1 Application topically as needed for muscle pain.   No facility-administered encounter medications on file as of 07/21/2022.    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 Britta Mccreedy: LA Class B esophagitis. PEG tube in place. erythema in proximal esophagus.    ESOPHAGOGASTRODUODENOSCOPY (EGD) WITH PROPOFOL N/A 03/11/2017   Web in upper third of esophagus s/p dilation with scope passage, small hiatal hernia, normal duodenum, no specimens collected   FUDUCIAL PLACEMENT N/A 10/28/2015   Procedure: PLACEMENT OF FUDUCIAL TIMES THREE; LEFT UPPER LOBE BIOPSY;  Surgeon: Grace Isaac, MD;  Location: [facility];  Service:  Thoracic;  Laterality: N/A;   MASTECTOMY, PARTIAL Left 1984   no cancer, fibrocystic breast disease   SKIN SURGERY  1960   moles removed from various areas per patient report   TONSILLECTOMY  1950   per patient report   [place]   vaginal hyst per patient report   VIDEO BRONCHOSCOPY WITH ENDOBRONCHIAL NAVIGATION N/A 10/28/2015   Procedure: VIDEO BRONCHOSCOPY WITH ENDOBRONCHIAL NAVIGATION;  Surgeon: Grace Isaac, MD;  Location[facility];  Service: Thoracic;  Laterality: N/A;   Family History  Problem Relation Age of Onset   Heart disease Mother    Social History   Socioeconomic History   Marital status: Widowed    Spouse name: Not on file   Number of children: 2   Years of education: Not on file   Highest education level: Not on file  Occupational History   Occupation: retired  Tobacco Use   Smoking status: Former    Packs/day: 1.00    Years: 23.00    Additional pack years: 0.00    Total pack years: 23.00    Types: Cigarettes    Quit date: 05/03/1982    Years since quitting: 40.2   Smokeless tobacco: Never  Vaping Use   Vaping Use: Never used  Substance and Sexual Activity   Alcohol use: No    Alcohol/week: 0.0 standard drinks of alcohol   Drug use: No   Sexual activity: Never    Birth control/protection: None  Other Topics Concern   Not on file  Social History Narrative   Not on file   Social Determinants of Health   Financial Resource Strain: Not on file  Food Insecurity: Not on file  Transportation Needs: Not on file  Physical Activity: Not on file  Stress: Not on file  Social Connections: Not on file    Tobacco Counseling Counseling given: Not Answered   Clinical Intake:  Pre-visit preparation completed: Yes  Pain : No/denies pain     Diabetes: No  How often do you need to have someone help you when you read instructions, pamphlets, or other written materials from your doctor or pharmacy?: 1 -  Never What is the last grade level you completed in school?: 12th grade  Diabetic?No    Activities of Daily Living    07/21/2022    1:12 PM 12/11/2021   10:37 AM  In your present state of health, do you have any difficulty performing the following activities:  Hearing? 0   Vision? 0   Difficulty concentrating or making decisions? 0   Walking or climbing stairs? 0   Dressing or bathing? 0   Doing errands, shopping? 0 0    Patient Care Team: Lindell Spar, MD as PCP - General (Internal  Medicine) Mallipeddi, Quenten Raven, MD as PCP - Cardiology (Cardiology) Gala Romney Cristopher Estimable, MD as Consulting Physician (Gastroenterology) Derek Jack, MD as Medical Oncologist (Medical Oncology) Brien Mates, RN as Oncology Nurse Navigator (Medical Oncology)  Indicate any recent Medical Services you may have received from other than [facility] providers in the past year (date may be approximate).     Assessment:   This is a routine wellness examination for [facility].  Hearing/Vision screen No results found.  Dietary issues and exercise activities discussed:     Goals Addressed   None    Depression Screen    07/21/2022    1:11 PM 06/29/2022    1:15 PM 05/28/2022    1:35 PM  PHQ 2/9 Scores  PHQ - 2 Score 0 1 2  PHQ- 9 Score  4 6    Fall Risk    07/21/2022    1:11 PM 06/29/2022    1:15 PM 05/28/2022    1:35 PM 11/26/2021    1:43 PM  Fall Risk   Falls in the past year? 0 0 0 0  Number falls in past yr: 0 0 0   Injury with Fall? 0 0 0   Follow up    Falls evaluation completed    FALL RISK PREVENTION PERTAINING TO THE HOME:  Any stairs in or around the home? No  If so, are there any without handrails? No  Home free of loose throw rugs in walkways, pet beds, electrical cords, etc? Yes  Adequate lighting in your home to reduce risk of falls? Yes   ASSISTIVE DEVICES UTILIZED TO PREVENT FALLS:  Life alert? No  Use of a cane, walker or w/c? No  Grab bars in the bathroom? Yes   Shower chair or bench in shower? No  Elevated toilet seat or a handicapped toilet? Yes   Cognitive Function:        07/21/2022    1:13 PM  6CIT Screen  What Year? 0 points  What month? 0 points  What time? 0 points  Count back from 20 0 points  Months in reverse 0 points  Repeat phrase 0 points  Total Score 0 points    Immunizations Immunization History  Administered Date(s) Administered   Fluad Quad(high Dose 65+) 01/17/2020   Influenza Split 01/22/2014   Influenza, High Dose Seasonal PF 01/28/2018   Influenza-Unspecified 01/30/2022   PFIZER SARS-COV-2 Pediatric Vaccination 5-70yrs 03/21/2020   Pneumococcal Conjugate-13 04/16/2016   Pneumococcal Polysaccharide-23 01/08/2021   Tdap 01/02/2005, 08/27/2017   Zoster Recombinat (Shingrix) 03/14/2019, 05/10/2019    TDAP status: Up to date  Flu Vaccine status: Up to date  Pneumococcal vaccine status: Up to date  Covid-19 vaccine status: Information provided on how to obtain vaccines.   Qualifies for Shingles Vaccine? Yes   Zostavax completed No   Shingrix Completed?: Yes  Screening Tests Health Maintenance  Topic Date Due   Medicare Annual Wellness (AWV)  Never done   COVID-19 Vaccine (1) 03/21/2020   DTaP/Tdap/Td (3 - Td or Tdap) 08/28/2027   Pneumonia Vaccine 13+ Years old  Completed   INFLUENZA VACCINE  Completed   DEXA SCAN  Completed   Hepatitis C Screening  Completed   Zoster Vaccines- Shingrix  Completed   HPV VACCINES  Aged Out    Health Maintenance  Health Maintenance Due  Topic Date Due   Medicare Annual Wellness (AWV)  Never done   COVID-19 Vaccine (1) 03/21/2020    Colorectal cancer screening: No longer required.  Mammogram status: Patient has recent treatment of breast cancer , under going surveillance with oncologist.   Bone Density status: Completed 12/25/2021. Results reflect: Bone density results: OSTEOPENIA. Repeat every 2 years.  Lung Cancer Screening: (Low Dose CT Chest  recommended if Age 69-80 years, 30 pack-year currently smoking OR have quit w/in 15years.) does not qualify.    Additional Screening:  Hepatitis C Screening: does not qualify; Completed 03/23/2018  Vision Screening: Recommended annual ophthalmology exams for early detection of glaucoma and other disorders of the eye. Is the patient up to date with their annual eye exam?  Yes  Who is the provider or what is the name of the office in which the patient attends annual eye exams? [facility] If pt is not established with a provider, would they like to be referred to a provider to establish care? No .   Dental Screening: Recommended annual dental exams for proper oral hygiene  Community Resource Referral / Chronic Care Management: CRR required this visit?  No   CCM required this visit?  No      Plan:     I have personally reviewed and noted the following in the patient's chart:   Medical and social history Use of alcohol, tobacco or illicit drugs  Current medications and supplements including opioid prescriptions. Patient is not currently taking opioid prescriptions. Functional ability and status Nutritional status Physical activity Advanced directives List of other physicians Hospitalizations, surgeries, and ER visits in previous 12 months Vitals Screenings to include cognitive, depression, and falls Referrals and appointments  In addition, I have reviewed and discussed with patient certain preventive protocols, quality metrics, and best practice recommendations. A written personalized care plan for preventive services as well as general preventive health recommendations were provided to patient.     Candace Dy, MD   07/21/2022

## 2022-07-21 NOTE — Patient Instructions (Addendum)
  Ms. Odoherty , Thank you for taking time to come for your Medicare Wellness Visit. I appreciate your ongoing commitment to your health goals. Please review the following plan we discussed and let me know if I can assist you in the future.   These are the goals we discussed: Patient is going to stay active. She is a cancer survivor and would love to eat solid food again. However she realizes this be unrealistic.    This is a list of the screening recommended for you and due dates:  Health Maintenance  Topic Date Due   Medicare Annual Wellness Visit  Never done   COVID-19 Vaccine (1) 03/21/2020   DTaP/Tdap/Td vaccine (3 - Td or Tdap) 08/28/2027   Pneumonia Vaccine  Completed   Flu Shot  Completed   DEXA scan (bone density measurement)  Completed   Hepatitis C Screening: USPSTF Recommendation to screen - Ages 36-79 yo.  Completed   Zoster (Shingles) Vaccine  Completed   HPV Vaccine  Aged Out

## 2022-07-30 ENCOUNTER — Ambulatory Visit (HOSPITAL_COMMUNITY)
Admission: RE | Admit: 2022-07-30 | Discharge: 2022-07-30 | Disposition: A | Discharge status: Home / Self Care | Payer: Medicare Other | Source: Ambulatory Visit | Attending: Internal Medicine | Admitting: Internal Medicine

## 2022-07-30 DIAGNOSIS — Z136 Encounter for screening for cardiovascular disorders: Secondary | ICD-10-CM | POA: Insufficient documentation

## 2022-07-31 ENCOUNTER — Telehealth: Payer: Self-pay

## 2022-07-31 NOTE — Telephone Encounter (Signed)
Mychart message sent to patient- active user.

## 2022-07-31 NOTE — Telephone Encounter (Signed)
-----   Message from Chalmers Guest, MD sent at 07/31/2022  3:22 PM EDT ----- Coronary artery calcium score is 90 (which is 49 percentile for age/gender). 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

## 2022-08-07 ENCOUNTER — Other Ambulatory Visit: Payer: Self-pay

## 2022-08-07 MED ORDER — ANASTROZOLE 1 MG PO TABS: 1.0000 mg | ORAL_TABLET | Freq: Every day | ORAL | 3 refills | Status: DC

## 2022-09-21 ENCOUNTER — Telehealth: Payer: Self-pay | Admitting: *Deleted

## 2022-09-21 NOTE — Telephone Encounter (Signed)
Patient called to express that she has began having pain in right breast.  She is a previous breast cancer patient and appt was made with Rojelio Brenner, [facility] for this Wednesday.  Patient and provider aware.

## 2022-09-22 NOTE — Progress Notes (Unsigned)
[facility] [address] [place], [place] [address]   CLINIC:  Medical Oncology/Hematology  PCP:  Anabel Halon, MD [address] 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  Right breast lumpectomy on 12/15/2021 2.  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   01/19/2014 PET scan   Tongue mass with 2 level II Right neck nodes T2N2b, Stage IVA   02/08/2014 - 04/03/2014 Radiation Therapy   Initiation of radiation therapy with 70 Gy delivered to the right base of tongue/tonsil 63 Gy to high risk nodal echelons, 56 Gy to the intermediate r base of tongue/tonsil/bilateral neck nodes   02/08/2014 - 03/15/2014 Chemotherapy   weekly cisplatin, only 4 doses given, severe toxicity with neutropenia, dehydration, nausea, vomiting, hospitalization and obstipation   10/24/2015 PET scan   low level non malig range hyper metab in L apical sub solid pulm nodule, suspicious for low grade adeno. diffuse tongue hypermetab without CT correlation   10/28/2015 Procedure   Bronchoscopy navigation, electromagnetic navigation, bronchoscopy with biopsy of LUL lung lesion, placement of fiducial markers X 3   10/28/2015 Pathology Results   Scant benign lung tissue, no tumor seen     CANCER STAGING: Cancer Staging  Ductal carcinoma in situ (DCIS) of right breast Staging form: Breast, AJCC 8th Edition - Clinical stage from 11/25/2021: Stage 0 (cTis (DCIS), cN0, cM0, ER+, PR+, HER2: Not Assessed) - Unsigned   INTERVAL HISTORY:   Ms. Candace Lewis, a 82 y.o. female, requested visit today for evaluation of right-sided breast pain.   She follows with Dr. Ellin Saba for DCIS of the right breast that was diagnosed in July 2023.  Per note by Dr. Ellin Saba (05/14/2022), patient did have some pain in the right breast region since lumpectomy on 12/15/2021.  ***  ***PAIN DESCRIPTORS***  She denies any symptoms of recurrence such as new lumps, bone pain, chest pain, dyspnea, or abdominal pain.*** ***  She has no new headaches, seizures, or focal neurologic deficits.  *** No B symptoms such as fever, chills, night sweats, unintentional weight loss.  She continues to take *** ARIMIDEX:  Hot flashes, mood swings, weight gain, alopecia, and musculoskeletal pain  She reports ***% energy and ***% appetite.  She is maintaining stable weight at this time.   ASSESSMENT & PLAN:  1.  Right breast pain - *** - Breast exam today shows generalized tenderness in the right breast with no palpable masses.  No palpable adenopathy.*** - PLAN: She is scheduled for bilateral diagnostic mammogram on 11/03/2022, and office visit with Dr. Ellin Saba on 11/11/2022.***   2.  Right breast DCIS, high-grade, with microinvasion, ER/PR positive - Mammogram (10/28/2021) calcifications in the UOQ right breast. - Biopsy (11/17/2021): High-grade DCIS, solid type with comedonecrosis.  Negative for invasive carcinoma.  Microcalcification present.  DCIS measures 6 mm in greatest linear extent.  ER 60% positive and PR 1% positive. - She had history of fibrocystic disease and had previously cyst removals x3 which were benign. - Right breast lumpectomy on 12/15/2021 - Pathology: Focus of microinvasive carcinoma, less than 1 mm arising in a background of high-grade DCIS  with necrosis and calcifications.  Margins negative for invasive carcinoma.  DCIS is focally less than 1 mm from posterior margin.  ER 60% and PR 1% positive.  PT31mi, PNx. - XRT to the right breast from 02/02/2022 through 02/13/2022 - Anastrozole started 01/06/2022 - Reviewed labs from 05/07/2022 with normal  LFTs, creatinine.  Mild leukopenia and thrombocytopenia stable. - Left breast diagnostic mammogram (04/14/2022): No findings of malignancy. - Breast exam today shows generalized tenderness in the right breast with no palpable masses.  No palpable adenopathy.***  PLAN: She is scheduled for bilateral diagnostic mammogram on 11/03/2022, and office visit with Dr. Ellin Saba on 11/11/2022.***   3.  OTHER ISSUES (not addressed during this visit) Malignant neoplasm of the tongue (2015) Osteopenia Pancytopenia Nutrition, unable to tolerate solid food Hypothyroidism  PLAN SUMMARY: >> *** >> *** >> ***   REVIEW OF SYSTEMS: ***  Review of Systems - Oncology  PHYSICAL EXAM:   Performance status (ECOG): {CHL ONC ZO:[id number]} *** There were no vitals filed for this visit. Wt Readings from Last 3 Encounters:  06/29/22 183 lb 6.4 oz (83.2 kg)  06/11/22 183 lb 6.4 oz (83.2 kg)  05/28/22 183 lb 3.2 oz (83.1 kg)   Physical Exam   PAST MEDICAL/SURGICAL HISTORY:  Past Medical History:  Diagnosis Date   Arthritis    lower back   Constipation    Diverticulitis    Ectopic pregnancy 1977   Esophageal cancer (HCC) 2015   tongue cancer, throat cancer   GERD (gastroesophageal reflux disease)    Headache    migraines   Hypertension    no longer taking meds   Hypokalemia 2015   Hypothyroidism    Lung nodule, multiple 01/08/2015   Macular degeneration of right eye    Malignant neoplasm of tongue (HCC) 01/24/2014   Thyroid disease    Past Surgical History:  Procedure Laterality Date   BIOPSY PHARYNX  2015   BREAST BIOPSY Bilateral    benign   BREAST BIOPSY Right 11/10/2021   CHOLECYSTECTOMY  2005   ESOPHAGOGASTRODUODENOSCOPY  06/19/2014   Dr. Teena Dunk: LA Class B esophagitis. PEG tube in place. erythema in proximal esophagus.    ESOPHAGOGASTRODUODENOSCOPY (EGD) WITH PROPOFOL N/A 03/11/2017   Web in upper third of esophagus s/p dilation with scope passage, small hiatal hernia, normal  duodenum, no specimens collected   FUDUCIAL PLACEMENT N/A 10/28/2015   Procedure: PLACEMENT OF FUDUCIAL TIMES THREE; LEFT UPPER LOBE BIOPSY;  Surgeon: Delight Ovens, MD;  Location: [facility] OR;  Service: Thoracic;  Laterality: N/A;   MASTECTOMY, PARTIAL Left 1984   no cancer, fibrocystic breast disease   SKIN SURGERY  1960   moles removed from various areas per patient report   TONSILLECTOMY  1950   per patient report   TUBAL LIGATION  1976   VAGINAL HYSTERECTOMY  1981   vaginal hyst per patient report   VIDEO BRONCHOSCOPY WITH ENDOBRONCHIAL NAVIGATION N/A 10/28/2015   Procedure: VIDEO BRONCHOSCOPY WITH ENDOBRONCHIAL NAVIGATION;  Surgeon: Delight Ovens, MD;  Location: [facility] OR;  Service: Thoracic;  Laterality: N/A;    SOCIAL HISTORY:  Social History   Socioeconomic History   Marital status: Widowed    Spouse name: Not on file   Number of children: 2   Years of education: Not on file   Highest education level: Not on file  Occupational History   Occupation: retired  Tobacco Use   Smoking status: Former    Packs/day: 1.00    Years: 23.00  Additional pack years: 0.00    Total pack years: 23.00    Types: Cigarettes    Quit date: 05/03/1982    Years since quitting: 40.4   Smokeless tobacco: Never  Vaping Use   Vaping Use: Never used  Substance and Sexual Activity   Alcohol use: No    Alcohol/week: 0.0 standard drinks of alcohol   Drug use: No   Sexual activity: Never    Birth control/protection: None  Other Topics Concern   Not on file  Social History Narrative   Not on file   Social Determinants of Health   Financial Resource Strain: Not on file  Food Insecurity: Not on file  Transportation Needs: Not on file  Physical Activity: Not on file  Stress: Not on file  Social Connections: Not on file  Intimate Partner Violence: Not on file    FAMILY HISTORY:  Family History  Problem Relation Age of Onset   Heart disease Mother     CURRENT MEDICATIONS:   Current Outpatient Medications  Medication Sig Dispense Refill   amitriptyline (ELAVIL) 10 MG tablet Take 1 tablet (10 mg total) by mouth at bedtime. 30 tablet 0   anastrozole (ARIMIDEX) 1 MG tablet Take 1 tablet (1 mg total) by mouth daily. Dissolve in water and drink once daily. 90 tablet 3   levothyroxine (SYNTHROID) 88 MCG tablet Take 1 tablet (88 mcg total) by mouth daily before breakfast. 90 tablet 1   metoprolol tartrate (LOPRESSOR) 25 MG tablet Take 1 tablet (25 mg total) by mouth 2 (two) times daily. 180 tablet 3   omeprazole (PRILOSEC OTC) 20 MG tablet 1 tablet 30 minutes before morning meal Orally Once a day for 90 days     rosuvastatin (CRESTOR) 20 MG tablet Take 20 mg by mouth at bedtime.     trolamine salicylate (ASPERCREME) 10 % cream Apply 1 Application topically as needed for muscle pain.     No current facility-administered medications for this visit.    ALLERGIES:  Allergies  Allergen Reactions   Adhesive [Tape] Rash    Reports rash and blistering if any adhesive tape   Latex Rash    Rash and skin blistering per patient report    LABORATORY DATA:  I have reviewed the labs as listed.     Latest Ref Rng & Units 05/07/2022    1:45 PM 11/25/2021    9:37 AM 11/21/2020   12:36 PM  CBC  WBC 4.0 - 10.5 K/uL 3.3  3.5  3.1   Hemoglobin 12.0 - 15.0 g/dL 16.1  09.6  04.5   Hematocrit 36.0 - 46.0 % 40.5  41.1  41.0   Platelets 150 - 400 K/uL 148  156  168       Latest Ref Rng & Units 06/29/2022    2:15 PM 05/07/2022    1:45 PM 11/25/2021    9:37 AM  CMP  Glucose 70 - 99 mg/dL 95  409  811   BUN 8 - 27 mg/dL 24  28  24    Creatinine 0.57 - 1.00 mg/dL 9.14  7.82  9.56   Sodium 134 - 144 mmol/L 142  138  138   Potassium 3.5 - 5.2 mmol/L 4.3  3.8  4.1   Chloride 96 - 106 mmol/L 102  103  107   CO2 20 - 29 mmol/L 23  26  25    Calcium 8.7 - 10.3 mg/dL 9.4  9.3  9.3   Total Protein 6.0 - 8.5 g/dL  6.3  7.2  7.2   Total Bilirubin 0.0 - 1.2 mg/dL 0.2  0.8  0.9   Alkaline Phos  44 - 121 IU/L 88  77  69   AST 0 - 40 IU/L 16  23  25    ALT 0 - 32 IU/L 12  20  20      DIAGNOSTIC IMAGING:  I have independently reviewed the scans and discussed with the patient. No results found.   WRAP UP:  All questions were answered. The patient knows to call the clinic with any problems, questions or concerns.  Medical decision making: ***  Time spent on visit: I spent {CHL ONC TIME VISIT - [id number]} counseling the patient face to face. The total time spent in the appointment was {CHL ONC TIME VISIT - BJYNW:[id number]} and more than 50% was on counseling.  Carnella Guadalajara, PA-C  ***

## 2022-09-23 ENCOUNTER — Inpatient Hospital Stay: Payer: Medicare Other | Attending: Physician Assistant | Admitting: Physician Assistant

## 2022-09-23 VITALS — BP 98/63 | HR 117 | Temp 98.1°F | Resp 18 | Wt 186.7 lb

## 2022-09-23 DIAGNOSIS — D0511 Intraductal carcinoma in situ of right breast: Secondary | ICD-10-CM | POA: Diagnosis not present

## 2022-09-23 DIAGNOSIS — R0789 Other chest pain: Secondary | ICD-10-CM

## 2022-09-23 DIAGNOSIS — Z79899 Other long term (current) drug therapy: Secondary | ICD-10-CM | POA: Insufficient documentation

## 2022-09-23 DIAGNOSIS — Z79811 Long term (current) use of aromatase inhibitors: Secondary | ICD-10-CM | POA: Insufficient documentation

## 2022-09-23 DIAGNOSIS — Z85810 Personal history of malignant neoplasm of tongue: Secondary | ICD-10-CM | POA: Diagnosis not present

## 2022-09-23 MED ORDER — DICLOFENAC SODIUM 1 % EX GEL: 2.0000 g | Freq: Four times a day (QID) | CUTANEOUS | 0 refills | Status: DC

## 2022-09-23 NOTE — Patient Instructions (Signed)
[facility] at [facility] **VISIT SUMMARY & IMPORTANT INSTRUCTIONS **   You were seen today by Rojelio Brenner PA-C for your right-sided breast pain.   Your pain is most likely due to muscle and bone strain and inflammation, particularly along the scar tissue from your most recent breast surgery. Your breast exam did not show any evidence of returning cancer. I will send a prescription to your pharmacy for Voltaren gel.  This is an anti-inflammatory medication that can be applied to painful areas 4 times daily. I also recommend using alternating heat and ice, no more than 20 minutes at a time, up to 3 times daily. Please see the attached handout for additional information.  FOLLOW-UP APPOINTMENT: Follow-up with Dr. Ellin Saba as scheduled in July 2024.  ** Thank you for trusting me with your healthcare!  I strive to provide all of my patients with quality care at each visit.  If you receive a survey for this visit, I would be so grateful to you for taking the time to provide feedback.  Thank you in advance!  ~ Indiah Heyden                   Dr. Doreatha Massed   &   Rojelio Brenner, PA-C   - - - - - - - - - - - - - - - - - -    Thank you for choosing [facility] at [facility] to provide your oncology and hematology care.  To afford each patient quality time with our provider, please arrive at least 15 minutes before your scheduled appointment time.   If you have a lab appointment with the Cancer Center please come in thru the Main Entrance and check in at the main information desk.  You need to re-schedule your appointment should you arrive 10 or more minutes late.  We strive to give you quality time with our providers, and arriving late affects you and other patients whose appointments are after yours.  Also, if you no show three or more times for appointments you may be dismissed from the clinic at the providers discretion.     Again, thank  you for choosing [facility].  Our hope is that these requests will decrease the amount of time that you wait before being seen by our physicians.       _____________________________________________________________  Should you have questions after your visit to [facility], please contact our office at [phone] and follow the prompts.  Our office hours are 8:00 a.m. and 4:30 p.m. Monday - Friday.  Please note that voicemails left after 4:00 p.m. may not be returned until the following business day.  We are closed weekends and major holidays.  You do have access to a nurse 24-7, just call the main number to the clinic [phone] and do not press any options, hold on the line and a nurse will answer the phone.    For prescription refill requests, have your pharmacy contact our office and allow 72 hours.

## 2022-10-13 ENCOUNTER — Ambulatory Visit (INDEPENDENT_AMBULATORY_CARE_PROVIDER_SITE_OTHER): Payer: Medicare Other | Admitting: Internal Medicine

## 2022-10-13 ENCOUNTER — Encounter: Payer: Self-pay | Admitting: Internal Medicine

## 2022-10-13 VITALS — BP 123/65 | HR 100 | Ht 67.5 in | Wt 187.2 lb

## 2022-10-13 DIAGNOSIS — E559 Vitamin D deficiency, unspecified: Secondary | ICD-10-CM

## 2022-10-13 DIAGNOSIS — S29011D Strain of muscle and tendon of front wall of thorax, subsequent encounter: Secondary | ICD-10-CM | POA: Diagnosis not present

## 2022-10-13 DIAGNOSIS — F5101 Primary insomnia: Secondary | ICD-10-CM | POA: Diagnosis not present

## 2022-10-13 DIAGNOSIS — E782 Mixed hyperlipidemia: Secondary | ICD-10-CM | POA: Diagnosis not present

## 2022-10-13 DIAGNOSIS — M5136 Other intervertebral disc degeneration, lumbar region: Secondary | ICD-10-CM

## 2022-10-13 DIAGNOSIS — E039 Hypothyroidism, unspecified: Secondary | ICD-10-CM | POA: Diagnosis not present

## 2022-10-13 DIAGNOSIS — D0511 Intraductal carcinoma in situ of right breast: Secondary | ICD-10-CM

## 2022-10-13 DIAGNOSIS — L239 Allergic contact dermatitis, unspecified cause: Secondary | ICD-10-CM

## 2022-10-13 DIAGNOSIS — H353113 Nonexudative age-related macular degeneration, right eye, advanced atrophic without subfoveal involvement: Secondary | ICD-10-CM | POA: Diagnosis not present

## 2022-10-13 DIAGNOSIS — Z0001 Encounter for general adult medical examination with abnormal findings: Secondary | ICD-10-CM

## 2022-10-13 DIAGNOSIS — R739 Hyperglycemia, unspecified: Secondary | ICD-10-CM | POA: Diagnosis not present

## 2022-10-13 DIAGNOSIS — I10 Essential (primary) hypertension: Secondary | ICD-10-CM | POA: Diagnosis not present

## 2022-10-13 MED ORDER — TRIAMCINOLONE ACETONIDE 0.1 % EX CREA
1.0000 | TOPICAL_CREAM | Freq: Two times a day (BID) | CUTANEOUS | 0 refills | Status: AC
Start: 2022-10-13 — End: ?

## 2022-10-13 MED ORDER — METHYLPREDNISOLONE ACETATE 80 MG/ML IJ SUSP
40.0000 mg | Freq: Once | INTRAMUSCULAR | Status: AC
Start: 2022-10-13 — End: 2022-10-13
  Administered 2022-10-13: 40 mg via INTRAMUSCULAR

## 2022-10-13 NOTE — Patient Instructions (Signed)
Please continue to take medications as prescribed. ° °Please continue to follow low salt diet and ambulate as tolerated. ° °Please get fasting blood tests done before the next visit. °

## 2022-10-13 NOTE — Assessment & Plan Note (Signed)
MRI of lumbar spine reviewed Chronic low back pain Has had steroid injections Depo-Medrol IM today Tylenol arthritis as needed for pain Followed by orthopedic surgery 

## 2022-10-14 ENCOUNTER — Telehealth: Payer: Self-pay | Admitting: Internal Medicine

## 2022-10-14 ENCOUNTER — Other Ambulatory Visit: Payer: Self-pay

## 2022-10-14 DIAGNOSIS — Z0001 Encounter for general adult medical examination with abnormal findings: Secondary | ICD-10-CM | POA: Insufficient documentation

## 2022-10-14 DIAGNOSIS — S29011A Strain of muscle and tendon of front wall of thorax, initial encounter: Secondary | ICD-10-CM | POA: Insufficient documentation

## 2022-10-14 DIAGNOSIS — L239 Allergic contact dermatitis, unspecified cause: Secondary | ICD-10-CM | POA: Insufficient documentation

## 2022-10-14 DIAGNOSIS — F5101 Primary insomnia: Secondary | ICD-10-CM

## 2022-10-14 MED ORDER — AMITRIPTYLINE HCL 10 MG PO TABS
10.0000 mg | ORAL_TABLET | Freq: Every day | ORAL | 0 refills | Status: DC
Start: 2022-10-14 — End: 2023-02-15

## 2022-10-14 NOTE — Assessment & Plan Note (Signed)
Lab Results  Component Value Date   TSH 3.860 06/29/2022   On Levothyroxine 88 mcg QD 

## 2022-10-14 NOTE — Progress Notes (Signed)
Established Patient Office Visit  Subjective:  Patient ID: Candace Lewis, female    DOB: [birth year]  Age: 82 y.o. MRN: [id number]  CC:  Chief Complaint  Patient presents with   Annual Exam    Patient has two tick bites one on her leg and one on her neck. Patient feels she has a pulled muscle in her chest wall area.    HPI Candace Lewis is a 82 y.o. female with past medical history of breast ca., tongue ca., HTN, HLD, hypothyroidism and GERD who presents for annual physical.  HTN: BP is well-controlled. Takes medication - metoprolol tartrate 25 mg BID regularly. Patient denies headache, dizziness or dyspnea. She has palpitations, but usually controlled with Metoprolol. She takes Crestor for HLD.   GERD: She takes Omeprazole for GERD. Denies any nausea or vomiting.  She has chronic dysphagia and can only tolerate liquid.  She takes Loss adjuster, chartered protein supplement QID.  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   She has history of hypothyroidism, and takes levothyroxine 88 mcg daily.  Denies any recent change in weight or appetite.   She reports chronic insomnia.  She has spells of anxiety at nighttime.  Denies any anhedonia, SI or HI currently.  She has tried melatonin without much relief.  She was placed on trazodone in the last visit, but did not get any relief with it. She did not try Elavil after reading its side effects. She agrees to try it.  She reports 2 tick bites, one on her neck and one on her left leg on 09/30/22.  She has mild erythema without central clearing.  Denies any itching.  Denies any fever, chills, or recent worsening of fatigue.  She reports right-sided chest wall pain for the last 4 weeks.  She had moved heavy furniture prior to this.  She went to oncology as she was concerned due to her history of breast cancer.  She was advised to apply ice, which has improved her symptoms.  She also reports erythematous  rash over right side of the neck area.  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 Teena Dunk: LA Class B esophagitis. PEG tube in place. erythema in proximal esophagus.    ESOPHAGOGASTRODUODENOSCOPY (EGD) WITH PROPOFOL N/A 03/11/2017   Web in upper third of esophagus s/p dilation with scope passage, small hiatal hernia, normal duodenum, no specimens collected   FUDUCIAL PLACEMENT N/A 10/28/2015   Procedure: PLACEMENT OF FUDUCIAL TIMES THREE; LEFT UPPER LOBE BIOPSY;  Surgeon: Delight Ovens, MD;  Location: [facility] OR;  Service: Thoracic;  Laterality: N/A;   MASTECTOMY,  PARTIAL Left 1984   no cancer, fibrocystic breast disease   SKIN SURGERY  1960   moles removed from various areas per patient report   TONSILLECTOMY  1950   per patient report   TUBAL LIGATION  1976   VAGINAL HYSTERECTOMY  1981   vaginal hyst per patient report   VIDEO BRONCHOSCOPY WITH ENDOBRONCHIAL NAVIGATION N/A 10/28/2015   Procedure: VIDEO BRONCHOSCOPY WITH ENDOBRONCHIAL NAVIGATION;  Surgeon: Delight Ovens, MD;  Location: [facility] OR;  Service: Thoracic;  Laterality: N/A;    Family History  Problem Relation Age of Onset   Heart disease Mother     Social History   Socioeconomic History   Marital status: Widowed     Spouse name: Not on file   Number of children: 2   Years of education: Not on file   Highest education level: Not on file  Occupational History   Occupation: retired  Tobacco Use   Smoking status: Former    Packs/day: 1.00    Years: 23.00    Additional pack years: 0.00    Total pack years: 23.00    Types: Cigarettes    Quit date: 05/03/1982    Years since quitting: 40.4   Smokeless tobacco: Never  Vaping Use   Vaping Use: Never used  Substance and Sexual Activity   Alcohol use: No    Alcohol/week: 0.0 standard drinks of alcohol   Drug use: No   Sexual activity: Never    Birth control/protection: None  Other Topics Concern   Not on file  Social History Narrative   Not on file   Social Determinants of Health   Financial Resource Strain: Not on file  Food Insecurity: Not on file  Transportation Needs: Not on file  Physical Activity: Not on file  Stress: Not on file  Social Connections: Not on file  Intimate Partner Violence: Not on file    Outpatient Medications Prior to Visit  Medication Sig Dispense Refill   amitriptyline (ELAVIL) 10 MG tablet Take 1 tablet (10 mg total) by mouth at bedtime. 30 tablet 0   anastrozole (ARIMIDEX) 1 MG tablet Take 1 tablet (1 mg total) by mouth daily. Dissolve in water and drink once daily. 90 tablet 3   diclofenac Sodium (VOLTAREN) 1 % GEL Apply 2 g topically 4 (four) times daily. Apply to painful area around right ribs and right breast. 50 g 0   levothyroxine (SYNTHROID) 88 MCG tablet Take 1 tablet (88 mcg total) by mouth daily before breakfast. 90 tablet 1   metoprolol tartrate (LOPRESSOR) 25 MG tablet Take 1 tablet (25 mg total) by mouth 2 (two) times daily. 180 tablet 3   omeprazole (PRILOSEC OTC) 20 MG tablet 1 tablet 30 minutes before morning meal Orally Once a day for 90 days     rosuvastatin (CRESTOR) 20 MG tablet Take 20 mg by mouth at bedtime.     trolamine salicylate (ASPERCREME) 10 % cream Apply 1 Application topically as  needed for muscle pain.     No facility-administered medications prior to visit.    Allergies  Allergen Reactions   Adhesive [Tape] Rash    Reports rash and blistering if any adhesive tape   Latex Rash    Rash and skin blistering per patient report    ROS Review of Systems  Constitutional:  Negative for chills and fever.  HENT:  Negative for congestion, sinus pressure, sinus pain and sore throat.   Eyes:  Negative for pain and discharge.  Respiratory:  Negative for cough and shortness of breath.   Cardiovascular:  Negative for chest pain and palpitations.  Gastrointestinal:  Negative for abdominal pain, diarrhea, nausea and vomiting.  Endocrine: Negative for polydipsia and polyuria.  Genitourinary:  Negative for dysuria and hematuria.  Musculoskeletal:  Positive for neck pain. Negative for neck stiffness.  Skin:  Positive for rash.  Neurological:  Negative for dizziness and weakness.  Psychiatric/Behavioral:  Positive for sleep disturbance. Negative for agitation and behavioral problems. The patient is nervous/anxious.       Objective:    Physical Exam Vitals reviewed.  Constitutional:      General: She is not in acute distress.    Appearance: She is not diaphoretic.  HENT:     Head: Normocephalic and atraumatic.     Nose: Nose normal.     Mouth/Throat:     Mouth: Mucous membranes are moist.  Eyes:     General: No scleral icterus.    Extraocular Movements: Extraocular movements intact.  Neck:     Comments: Hardening of right submandibular area - chronic Cardiovascular:     Rate and Rhythm: Normal rate and regular rhythm.     Heart sounds: Normal heart sounds. No murmur heard. Pulmonary:     Breath sounds: Normal breath sounds. No wheezing or rales.  Musculoskeletal:     Cervical back: Neck supple. No tenderness.     Right lower leg: No edema.     Left lower leg: No edema.  Skin:    General: Skin is warm.     Findings: Rash (Erythematous rash over the right  side of the lower neck area) present.     Comments: Erythema over left leg - about 0.5 cm in diameter around tick bite area without central clearing  Neurological:     General: No focal deficit present.     Mental Status: She is alert and oriented to person, place, and time.  Psychiatric:        Mood and Affect: Mood normal.        Behavior: Behavior normal.     BP 123/65 (BP Location: Right Arm, Patient Position: Sitting, Cuff Size: Normal)   Pulse 100   Ht 5' 7.5" (1.715 m)   Wt 187 lb 3.2 oz (84.9 kg)   SpO2 96%   BMI 28.89 kg/m  Wt Readings from Last 3 Encounters:  10/13/22 187 lb 3.2 oz (84.9 kg)  09/23/22 186 lb 11.7 oz (84.7 kg)  06/29/22 183 lb 6.4 oz (83.2 kg)    Lab Results  Component Value Date   TSH 3.860 06/29/2022   Lab Results  Component Value Date   WBC 3.3 (L) 05/07/2022   HGB 13.8 05/07/2022   HCT 40.5 05/07/2022   MCV 88.0 05/07/2022   PLT 148 (L) 05/07/2022   Lab Results  Component Value Date   NA 142 06/29/2022   K 4.3 06/29/2022   CO2 23 06/29/2022   GLUCOSE 95 06/29/2022   BUN 24 06/29/2022   CREATININE 0.81 06/29/2022   BILITOT 0.2 06/29/2022   ALKPHOS 88 06/29/2022   AST 16 06/29/2022   ALT 12 06/29/2022   PROT 6.3 06/29/2022   ALBUMIN 4.1 06/29/2022   CALCIUM 9.4 06/29/2022   ANIONGAP 9 05/07/2022   EGFR 74 06/29/2022   No results found for: "CHOL" No results found for: "HDL" No results found for: "LDLCALC" No results found for: "TRIG" No results found for: "CHOLHDL" No results found for: "HGBA1C"    Assessment & Plan:  Problem List Items Addressed This Visit       Cardiovascular and Mediastinum   Essential hypertension    BP Readings from Last 1 Encounters:  10/13/22 123/65  Well-controlled with Metoprolol 25 mg BID Tried to switch from Lopressor to Toprol, but did not help with palpitations - placed back on Lopressor Counseled for compliance with the medications      Relevant Orders   CMP14+EGFR   CBC with  Differential/Platelet     Endocrine   Hypothyroidism    Lab Results  Component Value Date   TSH 3.860 06/29/2022  On Levothyroxine 88 mcg QD      Relevant Orders   TSH + free T4     Musculoskeletal and Integument   DDD (degenerative disc disease), lumbar    MRI of lumbar spine reviewed Chronic low back pain Has had steroid injections Depo-Medrol IM today Tylenol arthritis as needed for pain Followed by orthopedic surgery      Allergic dermatitis    Her right-sided neck erythema is likely due to allergic dermatitis versus radiation related dermatitis Kenalog cream as needed for itching      Relevant Medications   triamcinolone cream (KENALOG) 0.1 %   Pectoralis muscle strain    Right-sided chest wall pain likely due to pectoralis muscle strain from heavy furniture moving Has improved with ice application Tylenol as needed for pain        Other   Ductal carcinoma in situ (DCIS) of right breast    Right breast lumpectomy on 12/15/2021 XRT to the right breast from 02/02/2022 through 02/13/2022 Anastrozole started 01/06/2022 Followed by Oncology      Hyperlipidemia    On Crestor      Relevant Orders   Lipid panel   Primary insomnia    Likely due to GAD Added trazodone as needed, but did not help Switched to Elavil 10 mg qHS, needs to take it Sleep hygiene material provided      Encounter for general adult medical examination with abnormal findings - Primary    Physical exam as documented. Fasting blood tests ordered today.      Other Visit Diagnoses     Vitamin D deficiency       Relevant Orders   VITAMIN D 25 Hydroxy (Vit-D Deficiency, Fractures)   Hyperglycemia       Relevant Orders   Hemoglobin A1c       Meds ordered this encounter  Medications   triamcinolone cream (KENALOG) 0.1 %    Sig: Apply 1 Application topically 2 (two) times daily.    Dispense:  30 g    Refill:  0   methylPREDNISolone acetate (DEPO-MEDROL) injection 40 mg     Follow-up: Return in about 4 months (around 02/12/2023) for Insomnia and hypothyroidism.    Anabel Halon, MD

## 2022-10-14 NOTE — Assessment & Plan Note (Signed)
BP Readings from Last 1 Encounters:  10/13/22 123/65   Well-controlled with Metoprolol 25 mg BID Tried to switch from Lopressor to Toprol, but did not help with palpitations - placed back on Lopressor Counseled for compliance with the medications

## 2022-10-14 NOTE — Assessment & Plan Note (Signed)
On Crestor 

## 2022-10-14 NOTE — Assessment & Plan Note (Signed)
Her right-sided neck erythema is likely due to allergic dermatitis versus radiation related dermatitis Kenalog cream as needed for itching

## 2022-10-14 NOTE — Telephone Encounter (Signed)
Prescription Request  10/14/2022  LOV: 10/13/2022  What is the name of the medication or equipment? amitriptyline (ELAVIL) 10 MG tablet   Have you contacted your pharmacy to request a refill? Yes   Which pharmacy would you like this sent to?  [facility] [address] [place] - [address] [place] [address] Phone: [phone] Fax: [phone]    Patient notified that their request is being sent to the clinical staff for review and that they should receive a response within 2 business days.   Please advise at [facility] [phone]

## 2022-10-14 NOTE — Assessment & Plan Note (Signed)
Right breast lumpectomy on 12/15/2021 XRT to the right breast from 02/02/2022 through 02/13/2022 Anastrozole started 01/06/2022 Followed by Oncology 

## 2022-10-14 NOTE — Assessment & Plan Note (Signed)
Physical exam as documented. ?Fasting blood tests ordered today. ?

## 2022-10-14 NOTE — Assessment & Plan Note (Signed)
Right-sided chest wall pain likely due to pectoralis muscle strain from heavy furniture moving Has improved with ice application Tylenol as needed for pain

## 2022-10-14 NOTE — Assessment & Plan Note (Signed)
Likely due to GAD Added trazodone as needed, but did not help Switched to Elavil 10 mg qHS, needs to take it Sleep hygiene material provided

## 2022-10-14 NOTE — Telephone Encounter (Signed)
Refills sent to pharmacy. 

## 2022-10-22 DIAGNOSIS — R739 Hyperglycemia, unspecified: Secondary | ICD-10-CM | POA: Diagnosis not present

## 2022-10-22 DIAGNOSIS — E039 Hypothyroidism, unspecified: Secondary | ICD-10-CM | POA: Diagnosis not present

## 2022-10-22 DIAGNOSIS — E782 Mixed hyperlipidemia: Secondary | ICD-10-CM | POA: Diagnosis not present

## 2022-10-22 DIAGNOSIS — E559 Vitamin D deficiency, unspecified: Secondary | ICD-10-CM | POA: Diagnosis not present

## 2022-10-22 DIAGNOSIS — I10 Essential (primary) hypertension: Secondary | ICD-10-CM | POA: Diagnosis not present

## 2022-10-23 LAB — CBC WITH DIFFERENTIAL/PLATELET
Basophils Absolute: 0 10*3/uL (ref 0.0–0.2)
Basos: 1 %
EOS (ABSOLUTE): 0.1 10*3/uL (ref 0.0–0.4)
Eos: 3 %
Hematocrit: 39.6 % (ref 34.0–46.6)
Hemoglobin: 13.5 g/dL (ref 11.1–15.9)
Immature Grans (Abs): 0 10*3/uL (ref 0.0–0.1)
Immature Granulocytes: 0 %
Lymphocytes Absolute: 0.8 10*3/uL (ref 0.7–3.1)
Lymphs: 24 %
MCH: 29.4 pg (ref 26.6–33.0)
MCHC: 34.1 g/dL (ref 31.5–35.7)
MCV: 86 fL (ref 79–97)
Monocytes Absolute: 0.3 10*3/uL (ref 0.1–0.9)
Monocytes: 9 %
Neutrophils Absolute: 2.2 10*3/uL (ref 1.4–7.0)
Neutrophils: 63 %
Platelets: 151 10*3/uL (ref 150–450)
RBC: 4.59 x10E6/uL (ref 3.77–5.28)
RDW: 13.8 % (ref 11.7–15.4)
WBC: 3.5 10*3/uL (ref 3.4–10.8)

## 2022-10-23 LAB — LIPID PANEL
Chol/HDL Ratio: 3.2 ratio (ref 0.0–4.4)
Cholesterol, Total: 153 mg/dL (ref 100–199)
HDL: 48 mg/dL (ref >39)
LDL Chol Calc (NIH): 75 mg/dL (ref 0–99)
Triglycerides: 177 mg/dL — ABNORMAL HIGH (ref 0–149)
VLDL Cholesterol Cal: 30 mg/dL (ref 5–40)

## 2022-10-23 LAB — CMP14+EGFR
ALT: 14 IU/L (ref 0–32)
AST: 19 IU/L (ref 0–40)
Albumin: 4.2 g/dL (ref 3.8–4.8)
Alkaline Phosphatase: 95 IU/L (ref 44–121)
BUN/Creatinine Ratio: 29 — ABNORMAL HIGH (ref 12–28)
BUN: 27 mg/dL (ref 8–27)
Bilirubin Total: 0.7 mg/dL (ref 0.0–1.2)
CO2: 22 mmol/L (ref 20–29)
Calcium: 9.8 mg/dL (ref 8.7–10.3)
Chloride: 105 mmol/L (ref 96–106)
Creatinine, Ser: 0.92 mg/dL (ref 0.57–1.00)
Globulin, Total: 2.6 g/dL (ref 1.5–4.5)
Glucose: 96 mg/dL (ref 70–99)
Potassium: 4.2 mmol/L (ref 3.5–5.2)
Sodium: 141 mmol/L (ref 134–144)
Total Protein: 6.8 g/dL (ref 6.0–8.5)
eGFR: 63 mL/min/{1.73_m2} (ref >59)

## 2022-10-23 LAB — VITAMIN D 25 HYDROXY (VIT D DEFICIENCY, FRACTURES): Vit D, 25-Hydroxy: 37.5 ng/mL (ref 30.0–100.0)

## 2022-10-23 LAB — TSH+FREE T4
Free T4: 1.34 ng/dL (ref 0.82–1.77)
TSH: 2.92 u[IU]/mL (ref 0.450–4.500)

## 2022-10-23 LAB — HEMOGLOBIN A1C
Est. average glucose Bld gHb Est-mCnc: 105 mg/dL
Hgb A1c MFr Bld: 5.3 % (ref 4.8–5.6)

## 2022-11-03 ENCOUNTER — Ambulatory Visit (HOSPITAL_COMMUNITY)
Admission: RE | Admit: 2022-11-03 | Discharge: 2022-11-03 | Disposition: A | Discharge status: Home / Self Care | Payer: Medicare Other | Source: Ambulatory Visit | Attending: Hematology | Admitting: Hematology

## 2022-11-03 ENCOUNTER — Inpatient Hospital Stay: Payer: Medicare Other | Attending: Physician Assistant

## 2022-11-03 DIAGNOSIS — Z9889 Other specified postprocedural states: Secondary | ICD-10-CM | POA: Insufficient documentation

## 2022-11-03 DIAGNOSIS — R928 Other abnormal and inconclusive findings on diagnostic imaging of breast: Secondary | ICD-10-CM | POA: Diagnosis not present

## 2022-11-03 DIAGNOSIS — E039 Hypothyroidism, unspecified: Secondary | ICD-10-CM | POA: Diagnosis not present

## 2022-11-03 DIAGNOSIS — M8580 Other specified disorders of bone density and structure, unspecified site: Secondary | ICD-10-CM | POA: Diagnosis not present

## 2022-11-03 DIAGNOSIS — Z79811 Long term (current) use of aromatase inhibitors: Secondary | ICD-10-CM | POA: Insufficient documentation

## 2022-11-03 DIAGNOSIS — Z85810 Personal history of malignant neoplasm of tongue: Secondary | ICD-10-CM | POA: Diagnosis not present

## 2022-11-03 DIAGNOSIS — Z923 Personal history of irradiation: Secondary | ICD-10-CM | POA: Insufficient documentation

## 2022-11-03 DIAGNOSIS — D0511 Intraductal carcinoma in situ of right breast: Secondary | ICD-10-CM

## 2022-11-03 DIAGNOSIS — Z79890 Hormone replacement therapy (postmenopausal): Secondary | ICD-10-CM | POA: Diagnosis not present

## 2022-11-03 DIAGNOSIS — Z79899 Other long term (current) drug therapy: Secondary | ICD-10-CM | POA: Insufficient documentation

## 2022-11-03 LAB — COMPREHENSIVE METABOLIC PANEL
ALT: 18 U/L (ref 0–44)
AST: 22 U/L (ref 15–41)
Albumin: 3.5 g/dL (ref 3.5–5.0)
Alkaline Phosphatase: 72 U/L (ref 38–126)
Anion gap: 8 (ref 5–15)
BUN: 26 mg/dL — ABNORMAL HIGH (ref 8–23)
CO2: 25 mmol/L (ref 22–32)
Calcium: 9.2 mg/dL (ref 8.9–10.3)
Chloride: 105 mmol/L (ref 98–111)
Creatinine, Ser: 0.97 mg/dL (ref 0.44–1.00)
GFR, Estimated: 59 mL/min — ABNORMAL LOW (ref >60)
Glucose, Bld: 117 mg/dL — ABNORMAL HIGH (ref 70–99)
Potassium: 3.7 mmol/L (ref 3.5–5.1)
Sodium: 138 mmol/L (ref 135–145)
Total Bilirubin: 0.6 mg/dL (ref 0.3–1.2)
Total Protein: 6.8 g/dL (ref 6.5–8.1)

## 2022-11-03 LAB — CBC WITH DIFFERENTIAL/PLATELET
Abs Immature Granulocytes: 0.01 10*3/uL (ref 0.00–0.07)
Basophils Absolute: 0 10*3/uL (ref 0.0–0.1)
Basophils Relative: 1 %
Eosinophils Absolute: 0.1 10*3/uL (ref 0.0–0.5)
Eosinophils Relative: 4 %
HCT: 41.1 % (ref 36.0–46.0)
Hemoglobin: 13.6 g/dL (ref 12.0–15.0)
Immature Granulocytes: 0 %
Lymphocytes Relative: 21 %
Lymphs Abs: 0.7 10*3/uL (ref 0.7–4.0)
MCH: 28.8 pg (ref 26.0–34.0)
MCHC: 33.1 g/dL (ref 30.0–36.0)
MCV: 86.9 fL (ref 80.0–100.0)
Monocytes Absolute: 0.3 10*3/uL (ref 0.1–1.0)
Monocytes Relative: 8 %
Neutro Abs: 2.3 10*3/uL (ref 1.7–7.7)
Neutrophils Relative %: 66 %
Platelets: 146 10*3/uL — ABNORMAL LOW (ref 150–400)
RBC: 4.73 MIL/uL (ref 3.87–5.11)
RDW: 13.3 % (ref 11.5–15.5)
WBC: 3.5 10*3/uL — ABNORMAL LOW (ref 4.0–10.5)
nRBC: 0 % (ref 0.0–0.2)

## 2022-11-03 LAB — TSH: TSH: 5.68 u[IU]/mL — ABNORMAL HIGH (ref 0.350–4.500)

## 2022-11-04 ENCOUNTER — Other Ambulatory Visit (HOSPITAL_COMMUNITY): Payer: Self-pay | Admitting: Hematology

## 2022-11-04 ENCOUNTER — Other Ambulatory Visit: Payer: Medicare Other

## 2022-11-04 DIAGNOSIS — R921 Mammographic calcification found on diagnostic imaging of breast: Secondary | ICD-10-CM

## 2022-11-04 DIAGNOSIS — R928 Other abnormal and inconclusive findings on diagnostic imaging of breast: Secondary | ICD-10-CM

## 2022-11-11 ENCOUNTER — Inpatient Hospital Stay: Payer: Medicare Other | Admitting: Hematology

## 2022-11-16 ENCOUNTER — Ambulatory Visit: Admission: RE | Admit: 2022-11-16 | Payer: Medicare Other | Source: Ambulatory Visit

## 2022-11-16 DIAGNOSIS — D0512 Intraductal carcinoma in situ of left breast: Secondary | ICD-10-CM | POA: Diagnosis not present

## 2022-11-16 DIAGNOSIS — R921 Mammographic calcification found on diagnostic imaging of breast: Secondary | ICD-10-CM

## 2022-11-16 DIAGNOSIS — R928 Other abnormal and inconclusive findings on diagnostic imaging of breast: Secondary | ICD-10-CM

## 2022-11-16 HISTORY — PX: BREAST BIOPSY: SHX20

## 2022-11-24 ENCOUNTER — Encounter: Payer: Self-pay | Admitting: General Surgery

## 2022-11-24 ENCOUNTER — Ambulatory Visit: Payer: Medicare Other | Admitting: General Surgery

## 2022-11-24 VITALS — BP 135/88 | HR 101 | Temp 97.6°F | Resp 14 | Ht 67.5 in | Wt 188.0 lb

## 2022-11-24 DIAGNOSIS — D0512 Intraductal carcinoma in situ of left breast: Secondary | ICD-10-CM

## 2022-11-24 NOTE — Progress Notes (Signed)
LYNNOX GIRTEN; [id number]; [birth year]   HPI Patient is an 82 year old white female who was referred back to my care for a newly diagnosed left breast DCIS.  This was found on follow-up mammography.  The patient does not feel a lump.  It is approximately 2.5 cm in its greatest diameter in the upper, outer quadrant of the left breast.  Final pathology revealed a DCIS, high-grade with necrosis.  Patient is status post a right partial mastectomy with postoperative radiation therapy for a DCIS.  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 Teena Dunk: LA Class B esophagitis. PEG tube in place. erythema in proximal esophagus.    ESOPHAGOGASTRODUODENOSCOPY (EGD) WITH PROPOFOL N/A 03/11/2017   Web in upper third of esophagus s/p dilation with scope passage, small hiatal hernia, normal duodenum, no specimens collected   FUDUCIAL PLACEMENT N/A 10/28/2015   Procedure: PLACEMENT OF FUDUCIAL TIMES THREE; LEFT UPPER LOBE BIOPSY;  Surgeon: Delight Ovens, MD;  Location: [facility] OR;  Service: Thoracic;  Laterality: N/A;   MASTECTOMY, PARTIAL Left 1984   no cancer, fibrocystic breast  disease   SKIN SURGERY  1960   moles removed from various areas per patient report   TONSILLECTOMY  1950   per patient report   TUBAL LIGATION  1976   VAGINAL HYSTERECTOMY  1981   vaginal hyst per patient report   VIDEO BRONCHOSCOPY WITH ENDOBRONCHIAL NAVIGATION N/A 10/28/2015   Procedure: VIDEO BRONCHOSCOPY WITH ENDOBRONCHIAL NAVIGATION;  Surgeon: Delight Ovens, MD;  Location: [facility] OR;  Service: Thoracic;  Laterality: N/A;    Family History  Problem Relation Age of Onset   Heart disease Mother     Current Outpatient Medications on File Prior to Visit  Medication Sig Dispense Refill   amitriptyline (ELAVIL) 10 MG tablet Take 1 tablet (10 mg total) by mouth at bedtime. 30 tablet 0   anastrozole (ARIMIDEX) 1 MG tablet Take 1 tablet (1 mg total) by mouth daily. Dissolve in water and drink once daily. 90 tablet 3   diclofenac Sodium (VOLTAREN) 1 % GEL Apply 2 g topically 4 (four) times daily. Apply to painful area around right ribs and right breast. 50 g 0   levothyroxine (SYNTHROID) 88 MCG tablet Take 1 tablet (88 mcg total) by mouth daily before breakfast. 90 tablet 1   metoprolol tartrate (LOPRESSOR) 25 MG tablet Take 1 tablet (25 mg total) by mouth 2 (two) times daily. 180 tablet 3   omeprazole (PRILOSEC OTC) 20 MG tablet 1 tablet 30 minutes before  morning meal Orally Once a day for 90 days     rosuvastatin (CRESTOR) 20 MG tablet Take 20 mg by mouth at bedtime.     triamcinolone cream (KENALOG) 0.1 % Apply 1 Application topically 2 (two) times daily. 30 g 0   trolamine salicylate (ASPERCREME) 10 % cream Apply 1 Application topically as needed for muscle pain.     No current facility-administered medications on file prior to visit.    Allergies  Allergen Reactions   Adhesive [Tape] Rash    Reports rash and blistering if any adhesive tape   Latex Rash    Rash and skin blistering per patient report    Social History   Substance and Sexual Activity  Alcohol Use No    Alcohol/week: 0.0 standard drinks of alcohol    Social History   Tobacco Use  Smoking Status Former   Current packs/day: 0.00   Average packs/day: 1 pack/day for 23.0 years (23.0 ttl pk-yrs)   Types: Cigarettes   Start date: 05/04/1959   Quit date: 05/03/1982   Years since quitting: 40.5  Smokeless Tobacco Never    Review of Systems  Constitutional: Negative.   HENT:  Positive for sinus pain.   Eyes: Negative.   Respiratory: Negative.    Cardiovascular: Negative.   Gastrointestinal:  Positive for heartburn.  Genitourinary: Negative.   Musculoskeletal:  Positive for neck pain.  Skin: Negative.   Neurological:  Positive for headaches.  Endo/Heme/Allergies: Negative.   Psychiatric/Behavioral: Negative.      Objective   Vitals:   11/24/22 1509  BP: 135/88  Pulse: (!) 101  Resp: 14  Temp: 97.6 F (36.4 C)  SpO2: 95%    Physical Exam Vitals reviewed. Exam conducted with a chaperone present.  Constitutional:      Appearance: Normal appearance. She is not ill-appearing.  HENT:     Head: Normocephalic and atraumatic.  Cardiovascular:     Rate and Rhythm: Normal rate and regular rhythm.     Heart sounds: Normal heart sounds. No murmur heard.    No friction rub. No gallop.  Pulmonary:     Effort: Pulmonary effort is normal. No respiratory distress.     Breath sounds: Normal breath sounds. No stridor. No wheezing, rhonchi or rales.  Skin:    General: Skin is warm and dry.  Neurological:     Mental Status: She is alert and oriented to person, place, and time.   Breast: No dominant mass, nipple discharge, or dimpling of the right breast.  The axilla is negative for palpable nodes.  Left breast examination reveals no dominant mass, nipple discharge, or dimpling.  The axilla is negative for palpable nodes.  There is a small ecchymotic area where the breast was biopsied. Mammogram, biopsy results reviewed Assessment  Left breast DCIS History of right breast DCIS with  radiation therapy Plan  Patient has an appointment to see Dr. Ellin Saba of oncology in 2 days.  She is asking whether or not she needs bilateral mastectomies.  She would want to avoid asymmetry should she need to undergo a left mastectomy.  I told her she may be a candidate for a left partial mastectomy with radiation therapy, though given the fact that she has had multiple episodes of radiation treatment in other areas, radiation oncology would need to weigh in.  She will see me in follow-up after she has seen Dr. Ellin Saba.

## 2022-11-25 NOTE — Progress Notes (Signed)
[facility] [address] [place] [address]    Clinic Day:  11/26/2022  Referring physician: Rebekah Chesterfield, NP  Patient Care Team: Anabel Halon, MD as PCP - General (Internal Medicine) Marjo Bicker, MD as PCP - Cardiology (Cardiology) Jena Gauss Gerrit Friends, MD as Consulting Physician (Gastroenterology) Doreatha Massed, MD as Medical Oncologist (Medical Oncology) Therese Sarah, RN as Oncology Nurse Navigator (Medical Oncology)   ASSESSMENT & PLAN:   Assessment: Right breast DCIS, high-grade: - She felt a lump in the right breast UOQ 2 months ago with some soreness. - Mammogram (10/28/2021) calcifications in the UOQ right breast. - Biopsy (11/17/2021): High-grade DCIS, solid type with comedonecrosis.  Negative for invasive carcinoma.  Microcalcification present.  DCIS measures 6 mm in greatest linear extent.  ER 60% positive and PR 1% positive. - She had history of fibrocystic disease and had previously cyst removals x3 which were benign. - Right breast lumpectomy on 12/15/2021 - Pathology: Focus of microinvasive carcinoma, less than 1 mm arising in a background of high-grade DCIS with necrosis and calcifications.  Margins negative for invasive carcinoma.  DCIS is focally less than 1 mm from posterior margin.  ER 60% and PR 1% positive.  PT80mi, PNx. - XRT to the right breast from 02/02/2022 through 02/13/2022 - Anastrozole started 01/06/2022    Social/family history: - She lives at home by herself.  She is seen with her granddaughter.  She is independent of ADLs and IADLs.  She did construction/electrical work.  Quit smoking in 1984. - Maternal grandfather had lung cancer.  3.  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  Plan: 1.  Right breast DCIS with microinvasion, ER/PR positive: -Reviewed labs from 05/07/2022 with normal LFTs, creatinine.  Mild leukopenia and  thrombocytopenia stable. - Left breast diagnostic mammogram (04/14/2022): (With no findings of malignancy. - Breast exam today shows generalized tenderness in the right breast with no palpable masses.  No palpable adenopathy. - Reviewed labs from 05/07/2022 which showed normal LFTs and CBC. - She will have bilateral diagnostic mammogram in June 2024. - We will schedule her for follow-up in 6 months.  I have told her to try melatonin 5 mg around 6 PM for sleep problem.   2.  Osteopenia (DEXA 12/25/2021 T-score -1.7): - Vitamin D level is 35. - Continue calcium and vitamin D supplements.   3.  Nutrition: - She is not able to eat any solid food since she completed chemoradiation in December 2015. - Continue 4 ensures with 8 ounces of milk per day.  Weight is stable.   4.  Hypothyroidism: - She is on Synthroid 88 mcg daily.  Last TSH is 6.1 on 01/13/2022. - Will repeat TSH in 6 months  No orders of the defined types were placed in this encounter.     Alben Deeds Teague,acting as a Neurosurgeon for Doreatha Massed, MD.,have documented all relevant documentation on the behalf of Doreatha Massed, MD,as directed by  Doreatha Massed, MD while in the presence of Doreatha Massed, MD.   ***  Esbon R Teague   7/24/202410:02 PM  CHIEF COMPLAINT:   Diagnosis: Ductal carcinoma in situ (DCIS) of right breast   Cancer Staging  Ductal carcinoma in situ (DCIS) of right breast Staging form: Breast, AJCC 8th Edition - Clinical stage from 11/25/2021: Stage 0 (cTis (DCIS), cN0, cM0, ER+, PR+, HER2: Not Assessed) - Unsigned  Prior Therapy: 1.  Right breast lumpectomy on 12/15/2021 2.  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   01/19/2014 PET scan   Tongue mass with 2 level II Right neck nodes T2N2b,  Stage IVA   02/08/2014 - 04/03/2014 Radiation Therapy   Initiation of radiation therapy with 70 Gy delivered to the right base of tongue/tonsil 63 Gy to high risk nodal echelons, 56 Gy to the intermediate r base of tongue/tonsil/bilateral neck nodes   02/08/2014 - 03/15/2014 Chemotherapy   weekly cisplatin, only 4 doses given, severe toxicity with neutropenia, dehydration, nausea, vomiting, hospitalization and obstipation   10/24/2015 PET scan   low level non malig range hyper metab in L apical sub solid pulm nodule, suspicious for low grade adeno. diffuse tongue hypermetab without CT correlation   10/28/2015 Procedure   Bronchoscopy navigation, electromagnetic navigation, bronchoscopy with biopsy of LUL lung lesion, placement of fiducial markers X 3   10/28/2015 Pathology Results   Scant benign lung tissue, no tumor seen      INTERVAL HISTORY:   Candace Lewis is a 82 y.o. female presenting to clinic today for follow up of  high-grade right breast DCIS, ER/PR positive. She was last seen by me on 05/14/22. She was seen by PA Rebekah on 09/23/22.   She underwent a bilateral MM on 7/2 that found: new lumpectomy changes in the right breast with no mammographic evidence of malignancy in the right breast and a suspicious  2.5 cm group of calcifications in the upper-outer left breast. She had a MM of the left breast with biopsy on 7/15 and pathology revealed a DCIS, high-grade with necrosis.   Today, she states that she is doing well overall. Her appetite level is at ***%. Her energy level is at ***%.  PAST MEDICAL HISTORY:   Past Medical History: Past Medical History:  Diagnosis Date   Arthritis    lower back   Constipation    Diverticulitis    Ectopic pregnancy 1977   Esophageal cancer (HCC) 2015   tongue cancer, throat cancer   GERD (gastroesophageal reflux disease)    Headache    migraines   Hypertension    no longer taking meds   Hypokalemia 2015   Hypothyroidism    Lung nodule,  multiple 01/08/2015   Macular degeneration of right eye    Malignant neoplasm of tongue (HCC) 01/24/2014   Thyroid disease     Surgical History: Past Surgical History:  Procedure Laterality Date   BIOPSY PHARYNX  2015   BREAST BIOPSY Bilateral    benign   BREAST BIOPSY Right 11/10/2021   BREAST BIOPSY Left 11/16/2022   MM LT BREAST BX W LOC DEV 1ST LESION IMAGE BX SPEC STEREO GUIDE 11/16/2022 [facility]   CHOLECYSTECTOMY  2005   ESOPHAGOGASTRODUODENOSCOPY  06/19/2014   Dr. Teena Dunk: LA Class B esophagitis. PEG tube in place. erythema in proximal esophagus.    ESOPHAGOGASTRODUODENOSCOPY (EGD) WITH PROPOFOL N/A 03/11/2017   Web in upper third of esophagus s/p dilation with scope passage, small hiatal hernia, normal duodenum, no specimens collected   FUDUCIAL PLACEMENT N/A 10/28/2015   Procedure: PLACEMENT OF FUDUCIAL TIMES THREE; LEFT UPPER LOBE BIOPSY;  Surgeon: Delight Ovens, MD;  Location: [facility] OR;  Service: Thoracic;  Laterality: N/A;   MASTECTOMY, PARTIAL Left 1984  no cancer, fibrocystic breast disease   SKIN SURGERY  1960   moles removed from various areas per patient report   TONSILLECTOMY  1950   per patient report   TUBAL LIGATION  1976   VAGINAL HYSTERECTOMY  1981   vaginal hyst per patient report   VIDEO BRONCHOSCOPY WITH ENDOBRONCHIAL NAVIGATION N/A 10/28/2015   Procedure: VIDEO BRONCHOSCOPY WITH ENDOBRONCHIAL NAVIGATION;  Surgeon: Delight Ovens, MD;  Location: [facility] OR;  Service: Thoracic;  Laterality: N/A;    Social History: Social History   Socioeconomic History   Marital status: Widowed    Spouse name: Not on file   Number of children: 2   Years of education: Not on file   Highest education level: Not on file  Occupational History   Occupation: retired  Tobacco Use   Smoking status: Former    Current packs/day: 0.00    Average packs/day: 1 pack/day for 23.0 years (23.0 ttl pk-yrs)    Types: Cigarettes    Start date: 05/04/1959    Quit date:  05/03/1982    Years since quitting: 40.5   Smokeless tobacco: Never  Vaping Use   Vaping status: Never Used  Substance and Sexual Activity   Alcohol use: No    Alcohol/week: 0.0 standard drinks of alcohol   Drug use: No   Sexual activity: Never    Birth control/protection: None  Other Topics Concern   Not on file  Social History Narrative   Not on file   Social Determinants of Health   Financial Resource Strain: Low Risk  (06/09/2021)   Received from [facility], [facility]   Overall Financial Resource Strain (CARDIA)    Difficulty of Paying Living Expenses: Not hard at all  Food Insecurity: Food Insecurity Present (06/09/2021)   Received from [facility], [facility]   Hunger Vital Sign    Worried About Running Out of Food in the Last Year: Sometimes true    Ran Out of Food in the Last Year: Never true  Transportation Needs: No Transportation Needs (06/09/2021)   Received from [facility], [facility]   [facility] - Transportation    Lack of Transportation (Medical): No    Lack of Transportation (Non-Medical): No  Physical Activity: Not on file  Stress: Not on file  Social Connections: Not on file  Intimate Partner Violence: Not on file    Family History: Family History  Problem Relation Age of Onset   Heart disease Mother     Current Medications:  Current Outpatient Medications:    amitriptyline (ELAVIL) 10 MG tablet, Take 1 tablet (10 mg total) by mouth at bedtime., Disp: 30 tablet, Rfl: 0   anastrozole (ARIMIDEX) 1 MG tablet, Take 1 tablet (1 mg total) by mouth daily. Dissolve in water and drink once daily., Disp: 90 tablet, Rfl: 3   diclofenac Sodium (VOLTAREN) 1 % GEL, Apply 2 g topically 4 (four) times daily. Apply to painful area around right ribs and right breast., Disp: 50 g, Rfl: 0   levothyroxine (SYNTHROID) 88 MCG tablet, Take 1 tablet (88 mcg total) by mouth daily before breakfast., Disp: 90 tablet, Rfl: 1   metoprolol tartrate  (LOPRESSOR) 25 MG tablet, Take 1 tablet (25 mg total) by mouth 2 (two) times daily., Disp: 180 tablet, Rfl: 3   omeprazole (PRILOSEC OTC) 20 MG tablet, 1 tablet 30 minutes before morning meal Orally Once a day for 90 days, Disp: , Rfl:    rosuvastatin (CRESTOR)  20 MG tablet, Take 20 mg by mouth at bedtime., Disp: , Rfl:    triamcinolone cream (KENALOG) 0.1 %, Apply 1 Application topically 2 (two) times daily., Disp: 30 g, Rfl: 0   trolamine salicylate (ASPERCREME) 10 % cream, Apply 1 Application topically as needed for muscle pain., Disp: , Rfl:    Allergies: Allergies  Allergen Reactions   Adhesive [Tape] Rash    Reports rash and blistering if any adhesive tape   Latex Rash    Rash and skin blistering per patient report    REVIEW OF SYSTEMS:   Review of Systems  Constitutional:  Negative for chills, fatigue and fever.  HENT:   Negative for lump/mass, mouth sores, nosebleeds, sore throat and trouble swallowing.   Eyes:  Negative for eye problems.  Respiratory:  Negative for cough and shortness of breath.   Cardiovascular:  Negative for chest pain, leg swelling and palpitations.  Gastrointestinal:  Negative for abdominal pain, constipation, diarrhea, nausea and vomiting.  Genitourinary:  Negative for bladder incontinence, difficulty urinating, dysuria, frequency, hematuria and nocturia.   Musculoskeletal:  Negative for arthralgias, back pain, flank pain, myalgias and neck pain.  Skin:  Negative for itching and rash.  Neurological:  Negative for dizziness, headaches and numbness.  Hematological:  Does not bruise/bleed easily.  Psychiatric/Behavioral:  Negative for depression, sleep disturbance and suicidal ideas. The patient is not nervous/anxious.   All other systems reviewed and are negative.    VITALS:   There were no vitals taken for this visit.  Wt Readings from Last 3 Encounters:  11/24/22 188 lb (85.3 kg)  10/13/22 187 lb 3.2 oz (84.9 kg)  09/23/22 186 lb 11.7 oz (84.7 kg)     There is no height or weight on file to calculate BMI.  Performance status (ECOG): {CHL ONC [id number]  PHYSICAL EXAM:   Physical Exam Vitals and nursing note reviewed. Exam conducted with a chaperone present.  Constitutional:      Appearance: Normal appearance.  Cardiovascular:     Rate and Rhythm: Normal rate and regular rhythm.     Pulses: Normal pulses.     Heart sounds: Normal heart sounds.  Pulmonary:     Effort: Pulmonary effort is normal.     Breath sounds: Normal breath sounds.  Abdominal:     Palpations: Abdomen is soft. There is no hepatomegaly, splenomegaly or mass.     Tenderness: There is no abdominal tenderness.  Musculoskeletal:     Right lower leg: No edema.     Left lower leg: No edema.  Lymphadenopathy:     Cervical: No cervical adenopathy.     Right cervical: No superficial, deep or posterior cervical adenopathy.    Left cervical: No superficial, deep or posterior cervical adenopathy.     Upper Body:     Right upper body: No supraclavicular or axillary adenopathy.     Left upper body: No supraclavicular or axillary adenopathy.  Neurological:     General: No focal deficit present.     Mental Status: She is alert and oriented to person, place, and time.  Psychiatric:        Mood and Affect: Mood normal.        Behavior: Behavior normal.     LABS:      Latest Ref Rng & Units 11/03/2022    1:23 PM 10/22/2022   10:02 AM 05/07/2022    1:45 PM  CBC  WBC 4.0 - 10.5 K/uL 3.5  3.5  3.3  Hemoglobin 12.0 - 15.0 g/dL 13.0  86.5  78.4   Hematocrit 36.0 - 46.0 % 41.1  39.6  40.5   Platelets 150 - 400 K/uL 146  151  148       Latest Ref Rng & Units 11/03/2022    1:23 PM 10/22/2022   10:02 AM 06/29/2022    2:15 PM  CMP  Glucose 70 - 99 mg/dL 696  96  95   BUN 8 - 23 mg/dL 26  27  24    Creatinine 0.44 - 1.00 mg/dL 2.95  2.84  1.32   Sodium 135 - 145 mmol/L 138  141  142   Potassium 3.5 - 5.1 mmol/L 3.7  4.2  4.3   Chloride 98 - 111 mmol/L 105  105   102   CO2 22 - 32 mmol/L 25  22  23    Calcium 8.9 - 10.3 mg/dL 9.2  9.8  9.4   Total Protein 6.5 - 8.1 g/dL 6.8  6.8  6.3   Total Bilirubin 0.3 - 1.2 mg/dL 0.6  0.7  0.2   Alkaline Phos 38 - 126 U/L 72  95  88   AST 15 - 41 U/L 22  19  16    ALT 0 - 44 U/L 18  14  12       No results found for: "CEA1", "CEA" / No results found for: "CEA1", "CEA" No results found for: "PSA1" No results found for: "GMW102" No results found for: "CAN125"  No results found for: "TOTALPROTELP", "ALBUMINELP", "A1GS", "A2GS", "BETS", "BETA2SER", "GAMS", "MSPIKE", "SPEI" No results found for: "TIBC", "FERRITIN", "IRONPCTSAT" Lab Results  Component Value Date   LDH 155 11/16/2018   LDH 140 04/18/2018   LDH 142 03/16/2018     STUDIES:   MM LT BREAST BX W LOC DEV 1ST LESION IMAGE BX SPEC STEREO GUIDE  Addendum Date: 11/18/2022   ADDENDUM REPORT: 11/18/2022 08:32 ADDENDUM: PATHOLOGY revealed: Site Breast, LEFT, needle core biopsy, UOQ calcifications - FOCI OF DUCTAL CARCINOMA IN SITU, HIGH GRADE - NECROSIS: PRESENT, PROMINENT - CALCIFICATIONS: PRESENT - DCIS LENGTH: ~ 0.1 CM Pathology results are CONCORDANT with imaging findings, per Dr. Baird Lyons. Pathology results and recommendations below were discussed with patient by telephone on 11/17/2022. Patient reported biopsy site with slight tenderness at the site. Post biopsy care instructions were reviewed, questions were answered and my direct phone number was provided to patient. Patient was instructed to call the [facility] if any concerns or questions arise related to the biopsy. RECOMMENDATIONS: 1. Surgical and oncological consultation. Lynett Grimes, RN notified provider Franky Macho, MD (surgeon), Kennith Gain, RN Oncology Nurse Navigator, and Doreatha Massed, MD via Epic in-basket message regarding the need for surgical and oncological consultation, and will contact patient regarding appointment information. Recommended: Patient will  need bracketed needle localization. Pathology results reported by Lynett Grimes, RN on 11/17/2022. Electronically Signed   By: Baird Lyons M.D.   On: 11/18/2022 08:32   Result Date: 11/18/2022 CLINICAL DATA:  Left breast calcifications. EXAM: LEFT BREAST STEREOTACTIC CORE NEEDLE BIOPSY COMPARISON:  Previous exam(s). FINDINGS: The patient and I discussed the procedure of stereotactic-guided biopsy including benefits and alternatives. We discussed the high likelihood of a successful procedure. We discussed the risks of the procedure including infection, bleeding, tissue injury, clip migration, and inadequate sampling. Informed written consent was given. The usual time out protocol was performed immediately prior to the procedure. Using sterile technique and 1% lidocaine and 1% lidocaine with  epinephrine as local anesthetic, under stereotactic guidance, a 9 gauge vacuum assisted device was used to perform core needle biopsy of calcifications in the upper-outer quadrant of the left breast using a superior to inferior approach. Specimen radiograph was performed showing calcifications are present in the tissue samples. Specimens with calcifications are identified for pathology. Lesion quadrant: Upper-outer quadrant At the conclusion of the procedure, X shaped tissue marker clip was deployed into the biopsy cavity. Follow-up 2-view mammogram was performed and dictated separately. IMPRESSION: Stereotactic-guided biopsy of the left breast. No apparent complications. Electronically Signed: By: Baird Lyons M.D. On: 11/16/2022 11:39   MM CLIP PLACEMENT LEFT  Result Date: 11/16/2022 CLINICAL DATA:  Suspicious left breast calcifications. EXAM: 3D DIAGNOSTIC LEFT MAMMOGRAM POST STEREOTACTIC BIOPSY COMPARISON:  Previous exam(s). FINDINGS: 3D Mammographic images were obtained following stereotactic guided biopsy of the left breast. The biopsy marking clip is in expected location in the upper-outer quadrant of the left breast.  IMPRESSION: Appropriate positioning of the X shaped biopsy marking clip at the site of biopsy in the upper-outer quadrant of the left breast. Final Assessment: Post Procedure Mammograms for Marker Placement Electronically Signed   By: Baird Lyons M.D.   On: 11/16/2022 11:47   MM DIAG BREAST TOMO BILATERAL  Result Date: 11/03/2022 CLINICAL DATA:  82 year old female with right breast high-grade DCIS post lumpectomy 12/15/2021. history of bilateral breast cysts as well as remote benign left breast excision. EXAM: DIGITAL DIAGNOSTIC BILATERAL MAMMOGRAM WITH TOMOSYNTHESIS AND CAD TECHNIQUE: Bilateral digital diagnostic mammography and breast tomosynthesis was performed. The images were evaluated with computer-aided detection. COMPARISON:  Previous exam(s). ACR Breast Density Category c: The breasts are heterogeneously dense, which may obscure small masses. FINDINGS: New lumpectomy changes are present in the upper-outer posterior right breast. Spot compression magnification views of the right breast lumpectomy site were performed. There is no mammographic evidence of locally recurrent malignancy. Spot compression magnification views of the upper outer posterior left breast was performed demonstrating new linear oriented pleomorphic calcifications spanning 2.5 cm. IMPRESSION: 1. New lumpectomy changes in the right breast. There is no mammographic evidence of malignancy in the right breast. 2. Suspicious 2.5 cm group of calcifications in the upper-outer left breast. RECOMMENDATION: Recommend stereotactic guided biopsy of the new calcifications in the upper-outer left breast. I have discussed the findings and recommendations with the patient. If applicable, a reminder letter will be sent to the patient regarding the next appointment. BI-RADS CATEGORY  4: Suspicious. Electronically Signed   By: Edwin Cap M.D.   On: 11/03/2022 14:27

## 2022-11-26 ENCOUNTER — Inpatient Hospital Stay: Payer: Medicare Other | Admitting: Hematology

## 2022-11-26 VITALS — BP 136/89 | HR 104 | Temp 98.2°F | Resp 18 | Ht 67.5 in | Wt 188.7 lb

## 2022-11-26 DIAGNOSIS — M8580 Other specified disorders of bone density and structure, unspecified site: Secondary | ICD-10-CM | POA: Diagnosis not present

## 2022-11-26 DIAGNOSIS — D0511 Intraductal carcinoma in situ of right breast: Secondary | ICD-10-CM | POA: Diagnosis not present

## 2022-11-26 DIAGNOSIS — Z923 Personal history of irradiation: Secondary | ICD-10-CM | POA: Diagnosis not present

## 2022-11-26 DIAGNOSIS — Z79811 Long term (current) use of aromatase inhibitors: Secondary | ICD-10-CM | POA: Diagnosis not present

## 2022-11-26 DIAGNOSIS — Z79899 Other long term (current) drug therapy: Secondary | ICD-10-CM | POA: Diagnosis not present

## 2022-11-26 DIAGNOSIS — D0512 Intraductal carcinoma in situ of left breast: Secondary | ICD-10-CM

## 2022-11-26 DIAGNOSIS — E039 Hypothyroidism, unspecified: Secondary | ICD-10-CM | POA: Diagnosis not present

## 2022-11-26 DIAGNOSIS — Z85810 Personal history of malignant neoplasm of tongue: Secondary | ICD-10-CM | POA: Diagnosis not present

## 2022-11-26 NOTE — Patient Instructions (Signed)
[facility] at [facility] Discharge Instructions   You were seen and examined today by Dr. Ellin Saba.  He reviewed the results of your biopsy.   We will refer you to radiation oncology in [place].   Proceed with your surgery as planned.   We will see you back 4 weeks after your surgery.    Thank you for choosing [facility] at [facility] to provide your oncology and hematology care.  To afford each patient quality time with our provider, please arrive at least 15 minutes before your scheduled appointment time.   If you have a lab appointment with the Cancer Center please come in thru the Main Entrance and check in at the main information desk.  You need to re-schedule your appointment should you arrive 10 or more minutes late.  We strive to give you quality time with our providers, and arriving late affects you and other patients whose appointments are after yours.  Also, if you no show three or more times for appointments you may be dismissed from the clinic at the providers discretion.     Again, thank you for choosing [facility].  Our hope is that these requests will decrease the amount of time that you wait before being seen by our physicians.       _____________________________________________________________  Should you have questions after your visit to [facility], please contact our office at [phone] and follow the prompts.  Our office hours are 8:00 a.m. and 4:30 p.m. Monday - Friday.  Please note that voicemails left after 4:00 p.m. may not be returned until the following business day.  We are closed weekends and major holidays.  You do have access to a nurse 24-7, just call the main number to the clinic [phone] and do not press any options, hold on the line and a nurse will answer the phone.    For prescription refill requests, have your pharmacy contact our office and allow 72 hours.    Due to  Covid, you will need to wear a mask upon entering the hospital. If you do not have a mask, a mask will be given to you at the Main Entrance upon arrival. For doctor visits, patients may have 1 support person age 80 or older with them. For treatment visits, patients can not have anyone with them due to social distancing guidelines and our immunocompromised population.

## 2022-12-03 DIAGNOSIS — D0592 Unspecified type of carcinoma in situ of left breast: Secondary | ICD-10-CM | POA: Diagnosis not present

## 2022-12-03 DIAGNOSIS — D0512 Intraductal carcinoma in situ of left breast: Secondary | ICD-10-CM | POA: Diagnosis not present

## 2022-12-03 DIAGNOSIS — Z170 Estrogen receptor positive status [ER+]: Secondary | ICD-10-CM | POA: Diagnosis not present

## 2022-12-03 DIAGNOSIS — C50411 Malignant neoplasm of upper-outer quadrant of right female breast: Secondary | ICD-10-CM | POA: Diagnosis not present

## 2022-12-08 ENCOUNTER — Ambulatory Visit: Payer: Medicare Other | Admitting: General Surgery

## 2022-12-08 ENCOUNTER — Encounter: Payer: Self-pay | Admitting: General Surgery

## 2022-12-08 ENCOUNTER — Other Ambulatory Visit (HOSPITAL_COMMUNITY): Payer: Self-pay | Admitting: General Surgery

## 2022-12-08 VITALS — BP 164/93 | HR 81 | Temp 97.8°F | Resp 16 | Ht 67.5 in | Wt 189.0 lb

## 2022-12-08 DIAGNOSIS — D0512 Intraductal carcinoma in situ of left breast: Secondary | ICD-10-CM | POA: Diagnosis not present

## 2022-12-08 DIAGNOSIS — R928 Other abnormal and inconclusive findings on diagnostic imaging of breast: Secondary | ICD-10-CM

## 2022-12-08 NOTE — Progress Notes (Signed)
[facility] Note   HPI:  82 y.o. Female presents to clinic for evaluation of High-grade left breast DCIS ER/PR negative after seeing Dr. Ellin Saba on 7/25. She was told she may need breast MRI. Patient reports that if breast MRI were to show possible malignancy, she would like both breasts removed.  Review of Systems:  All other review of systems: otherwise negative   Vital Signs:  BP (!) 164/93   Pulse 81   Temp 97.8 F (36.6 C) (Other (Comment))   Resp 16   Ht 5' 7.5" (1.715 m)   Wt 189 lb (85.7 kg)   SpO2 96%   BMI 29.16 kg/m    Physical Exam:  General: pleasant and well-appearing Pulm: no increased work of breathing Psych: A&O x3, anxious   Laboratory studies: CBC:  Lab Results  Component Value Date   WBC 3.5 (L) 11/03/2022   RBC 4.73 11/03/2022   BMP:  Lab Results  Component Value Date   GLUCOSE 117 (H) 11/03/2022   CO2 25 11/03/2022   BUN 26 (H) 11/03/2022   BUN 27 10/22/2022   CREATININE 0.97 11/03/2022   CALCIUM 9.2 11/03/2022     Imaging:  Reviewed   Assessment:  82 y.o. yo Female with Mhx significant for HTN, lung nodule, s/p a right partial mastectomy with postoperative radiation therapy for a DCIS, s/p radiation therapy for tongue and throat cancer presenting with DCIS high-grade with necrosis of the left breast. Patient expressing frustrations with lack of definitive care. She was told she may need a left breast MRI. Discussed options with patient including the possible need for left total mastectomy if MRI suggests malignancy in other breast quadrants. In this is the case, the patient would like bilateral mastectomy. However, patient expresses she has dealt with this problem for too long and would not like MRI. Other option includes partial mastectomy with postoperative radiation. She would like to pursue this.  Plan:  - Radiotracer followed by scheduling for left partial mastectomy  - Follow up prn  All of the above  recommendations were discussed with the patient and all of patient's questions were answered to her expressed satisfaction.  Franky Macho, MD [facility] [address] [place] [place], [place] [address] [phone] (office)

## 2022-12-08 NOTE — Addendum Note (Signed)
Addended by: Phillips Odor on: 12/08/2022 12:23 PM   Modules accepted: Orders

## 2022-12-09 NOTE — H&P (Signed)
Candace Lewis; [id number]; [birth year]   HPI Patient is an 82 year old white female who was referred back to my care for a newly diagnosed left breast DCIS.  This was found on follow-up mammography.  The patient does not feel a lump.  It is approximately 2.5 cm in its greatest diameter in the upper, outer quadrant of the left breast.  Final pathology revealed a DCIS, high-grade with necrosis.  Patient is status post a right partial mastectomy with postoperative radiation therapy for a DCIS.  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 Teena Dunk: LA Class B esophagitis. PEG tube in place. erythema in proximal esophagus.    ESOPHAGOGASTRODUODENOSCOPY (EGD) WITH PROPOFOL N/A 03/11/2017   Web in upper third of esophagus s/p dilation with scope passage, small hiatal hernia, normal duodenum, no specimens collected   FUDUCIAL PLACEMENT N/A 10/28/2015   Procedure: PLACEMENT OF FUDUCIAL TIMES THREE; LEFT UPPER LOBE BIOPSY;  Surgeon: Delight Ovens, MD;  Location: [facility] OR;  Service: Thoracic;  Laterality: N/A;   MASTECTOMY, PARTIAL Left 1984   no cancer, fibrocystic breast  disease   SKIN SURGERY  1960   moles removed from various areas per patient report   TONSILLECTOMY  1950   per patient report   TUBAL LIGATION  1976   VAGINAL HYSTERECTOMY  1981   vaginal hyst per patient report   VIDEO BRONCHOSCOPY WITH ENDOBRONCHIAL NAVIGATION N/A 10/28/2015   Procedure: VIDEO BRONCHOSCOPY WITH ENDOBRONCHIAL NAVIGATION;  Surgeon: Delight Ovens, MD;  Location: [facility] OR;  Service: Thoracic;  Laterality: N/A;    Family History  Problem Relation Age of Onset   Heart disease Mother     Current Outpatient Medications on File Prior to Visit  Medication Sig Dispense Refill   amitriptyline (ELAVIL) 10 MG tablet Take 1 tablet (10 mg total) by mouth at bedtime. 30 tablet 0   anastrozole (ARIMIDEX) 1 MG tablet Take 1 tablet (1 mg total) by mouth daily. Dissolve in water and drink once daily. 90 tablet 3   diclofenac Sodium (VOLTAREN) 1 % GEL Apply 2 g topically 4 (four) times daily. Apply to painful area around right ribs and right breast. 50 g 0   levothyroxine (SYNTHROID) 88 MCG tablet Take 1 tablet (88 mcg total) by mouth daily before breakfast. 90 tablet 1   metoprolol tartrate (LOPRESSOR) 25 MG tablet Take 1 tablet (25 mg total) by mouth 2 (two) times daily. 180 tablet 3   omeprazole (PRILOSEC OTC) 20 MG tablet 1 tablet 30 minutes before  morning meal Orally Once a day for 90 days     rosuvastatin (CRESTOR) 20 MG tablet Take 20 mg by mouth at bedtime.     triamcinolone cream (KENALOG) 0.1 % Apply 1 Application topically 2 (two) times daily. 30 g 0   trolamine salicylate (ASPERCREME) 10 % cream Apply 1 Application topically as needed for muscle pain.     No current facility-administered medications on file prior to visit.    Allergies  Allergen Reactions   Adhesive [Tape] Rash    Reports rash and blistering if any adhesive tape   Latex Rash    Rash and skin blistering per patient report    Social History   Substance and Sexual Activity  Alcohol Use No    Alcohol/week: 0.0 standard drinks of alcohol    Social History   Tobacco Use  Smoking Status Former   Current packs/day: 0.00   Average packs/day: 1 pack/day for 23.0 years (23.0 ttl pk-yrs)   Types: Cigarettes   Start date: 05/04/1959   Quit date: 05/03/1982   Years since quitting: 40.5  Smokeless Tobacco Never    Review of Systems  Constitutional: Negative.   HENT:  Positive for sinus pain.   Eyes: Negative.   Respiratory: Negative.    Cardiovascular: Negative.   Gastrointestinal:  Positive for heartburn.  Genitourinary: Negative.   Musculoskeletal:  Positive for neck pain.  Skin: Negative.   Neurological:  Positive for headaches.  Endo/Heme/Allergies: Negative.   Psychiatric/Behavioral: Negative.      Objective   Vitals:   11/24/22 1509  BP: 135/88  Pulse: (!) 101  Resp: 14  Temp: 97.6 F (36.4 C)  SpO2: 95%    Physical Exam Vitals reviewed. Exam conducted with a chaperone present.  Constitutional:      Appearance: Normal appearance. She is not ill-appearing.  HENT:     Head: Normocephalic and atraumatic.  Cardiovascular:     Rate and Rhythm: Normal rate and regular rhythm.     Heart sounds: Normal heart sounds. No murmur heard.    No friction rub. No gallop.  Pulmonary:     Effort: Pulmonary effort is normal. No respiratory distress.     Breath sounds: Normal breath sounds. No stridor. No wheezing, rhonchi or rales.  Skin:    General: Skin is warm and dry.  Neurological:     Mental Status: She is alert and oriented to person, place, and time.   Breast: No dominant mass, nipple discharge, or dimpling of the right breast.  The axilla is negative for palpable nodes.  Left breast examination reveals no dominant mass, nipple discharge, or dimpling.  The axilla is negative for palpable nodes.  There is a small ecchymotic area where the breast was biopsied. Mammogram, biopsy results reviewed Assessment  Left breast DCIS History of right breast DCIS with  radiation therapy Plan  Patient has an appointment to see Dr. Ellin Saba of oncology in 2 days.  She is asking whether or not she needs bilateral mastectomies.  She would want to avoid asymmetry should she need to undergo a left mastectomy.  I told her she may be a candidate for a left partial mastectomy with radiation therapy, though given the fact that she has had multiple episodes of radiation treatment in other areas, radiation oncology would need to weigh in.  She will see me in follow-up after she has seen Dr. Ellin Saba. Addendum: Patient has elected to proceed with a left partial mastectomy after radiofrequency tag placement.  She  did not want to undergo breast MRI.  She is willing to undergo postoperative radiation therapy.  The risks and benefits of the procedure including bleeding, infection, and the possibility of needing further surgery for clear margins were fully explained to the patient, who gave informed consent.

## 2022-12-11 ENCOUNTER — Other Ambulatory Visit: Payer: Self-pay | Admitting: Internal Medicine

## 2022-12-11 DIAGNOSIS — E039 Hypothyroidism, unspecified: Secondary | ICD-10-CM

## 2022-12-15 ENCOUNTER — Encounter (HOSPITAL_COMMUNITY): Payer: Self-pay

## 2022-12-15 ENCOUNTER — Ambulatory Visit (HOSPITAL_COMMUNITY)
Admission: RE | Admit: 2022-12-15 | Discharge: 2022-12-15 | Disposition: A | Discharge status: Home / Self Care | Payer: Medicare Other | Source: Ambulatory Visit | Attending: General Surgery | Admitting: General Surgery

## 2022-12-15 DIAGNOSIS — R928 Other abnormal and inconclusive findings on diagnostic imaging of breast: Secondary | ICD-10-CM | POA: Insufficient documentation

## 2022-12-15 MED ORDER — LIDOCAINE HCL (PF) 2 % IJ SOLN
INTRAMUSCULAR | Status: AC
  Filled 2022-12-15: qty 10

## 2022-12-16 ENCOUNTER — Other Ambulatory Visit (HOSPITAL_COMMUNITY): Payer: Self-pay | Admitting: General Surgery

## 2022-12-16 DIAGNOSIS — D0512 Intraductal carcinoma in situ of left breast: Secondary | ICD-10-CM

## 2022-12-21 ENCOUNTER — Other Ambulatory Visit (HOSPITAL_COMMUNITY): Payer: Medicare Other

## 2022-12-22 ENCOUNTER — Other Ambulatory Visit: Payer: Self-pay | Admitting: Internal Medicine

## 2022-12-22 DIAGNOSIS — E039 Hypothyroidism, unspecified: Secondary | ICD-10-CM

## 2022-12-25 NOTE — Patient Instructions (Addendum)
Candace Lewis  12/25/2022     @PREFPERIOPPHARMACY @   Your procedure is scheduled on  12/30/2022.   Report to [facility] at  [phone]  A.M.   Call this number if you have problems the morning of surgery:  [phone]  If you experience any cold or flu symptoms such as cough, fever, chills, shortness of breath, etc. between now and your scheduled surgery, please notify us at the above number.   Remember:  Do not eat or drink after midnight.      Take these medicines the morning of surgery with A SIP OF WATER                      levothyroxine, metoprolol, omeprazole.     Do not wear jewelry, make-up or nail polish, including gel polish,  artificial nails, or any other type of covering on natural nails (fingers and  toes).  Do not wear lotions, powders, or perfumes, or deodorant.  Do not shave 48 hours prior to surgery.  Men may shave face and neck.  Do not bring valuables to the hospital.  [facility] is not responsible for any belongings or valuables.  Contacts, dentures or bridgework may not be worn into surgery.  Leave your suitcase in the car.  After surgery it may be brought to your room.  For patients admitted to the hospital, discharge time will be determined by your treatment team.  Patients discharged the day of surgery will not be allowed to drive home and must have someone with them for 24 hours.    Special instructions:   DO NOT smoke tobacco or vape for 24 hours before your procedure.  Please read over the following fact sheets that you were given. Pain Booklet, Coughing and Deep Breathing, Surgical Site Infection Prevention, Anesthesia Post-op Instructions, and Care and Recovery After Surgery       Breast Biopsy, Care After The following information offers guidance on how to care for yourself after your breast biopsy. Your doctor may also give you more specific instructions. If you have problems or questions, contact your doctor. What can I  expect after the procedure? After a breast biopsy, it is common to have: Bruising on your breast. Breast swelling. Numbness, tingling, or pain near your biopsy site. This site is where tissue was taken out for study. Follow these instructions at home: Medicines Take over-the-counter and prescription medicines only as told by your doctor. If you were given a sedative during your procedure, do not drive or use machines until your doctor says that it is safe. A sedative is a medicine that helps you relax. Do not drink alcohol while taking pain medicine. Ask your doctor if you should avoid driving or using machines while you are taking your medicine. Biopsy site care     Follow instructions from your doctor about how to take care of your cut from surgery (incision) or your puncture site. Make sure you: Wash your hands with soap and water for at least 20 seconds before and after you change your bandage. If you cannot use soap and water, use hand sanitizer. Change your bandage. Leave stitches or skin glue in place for at least 2 weeks. Leave tape strips alone unless you are told to take them off. You may trim the edges of the tape strips if they curl up. If you have stitches, keep them dry when you take a bath or a  shower. Check your cut or puncture site every day for signs of infection. Look for: More redness, swelling, or pain. More fluid or blood. Warmth. Pus or a bad smell. Protect the biopsy site. Do not let the site get bumped. Managing pain If told, put ice on the biopsy site. To do this: Put ice in a plastic bag. Place a towel between your skin and the bag. Leave the ice on for 20 minutes, 2-3 times a day. Take off the ice if your skin turns bright red. This is very important. If you cannot feel pain, heat, or cold, you have a greater risk of damage to the area. Activity If a cut was made in your skin to do the biopsy, avoid activities that could pull your cut open. These  include: Stretching. Reaching over your head. Exercise. Sports. Lifting anything that weighs more than 3 lb (1.4 kg). Return to your normal activities when your doctor says that it is safe. General instructions Follow your normal diet. Wear a good support bra for as long as told by your doctor. Get checked for extra fluid around your lymph nodes (lymphedema) as often as told. Do not smoke or use any products that contain nicotine or tobacco. If you need help quitting, ask your doctor. Keep all follow-up visits. Contact a doctor if: You notice any of these at or near the biopsy site: More redness, swelling, or pain. More fluid or blood. Warmth. Pus or a bad smell. The site breaking open after the stitches or skin tape strips have been removed. You have a rash or a fever. Get help right away if: You have trouble breathing. You have red streaks around the biopsy site. Summary After a breast biopsy, it is common to have bruising, numbness, tingling, or pain near your biopsy site. Ask your doctor if you should avoid driving or using machines while you are taking your medicine. If you had a cut made in your skin to do the biopsy, avoid activities that may pull the cut open. Return to your normal activities when your doctor says that it is safe. Wear a good support bra for as long as told by your doctor. This information is not intended to replace advice given to you by your health care provider. Make sure you discuss any questions you have with your health care provider. Document Revised: 02/12/2021 Document Reviewed: 02/12/2021 Elsevier Patient Education  2024 [employer] General Anesthesia, Adult, Care After The following information offers guidance on how to care for yourself after your procedure. Your health care provider may also give you more specific instructions. If you have problems or questions, contact your health care provider. What can I expect after the procedure? After  the procedure, it is common for people to: Have pain or discomfort at the IV site. Have nausea or vomiting. Have a sore throat or hoarseness. Have trouble concentrating. Feel cold or chills. Feel weak, sleepy, or tired (fatigue). Have soreness and body aches. These can affect parts of the body that were not involved in surgery. Follow these instructions at home: For the time period you were told by your health care provider:  Rest. Do not participate in activities where you could fall or become injured. Do not drive or use machinery. Do not drink alcohol. Do not take sleeping pills or medicines that cause drowsiness. Do not make important decisions or sign legal documents. Do not take care of children on your own. General instructions Drink enough fluid to keep your  urine pale yellow. If you have sleep apnea, surgery and certain medicines can increase your risk for breathing problems. Follow instructions from your health care provider about wearing your sleep device: Anytime you are sleeping, including during daytime naps. While taking prescription pain medicines, sleeping medicines, or medicines that make you drowsy. Return to your normal activities as told by your health care provider. Ask your health care provider what activities are safe for you. Take over-the-counter and prescription medicines only as told by your health care provider. Do not use any products that contain nicotine or tobacco. These products include cigarettes, chewing tobacco, and vaping devices, such as e-cigarettes. These can delay incision healing after surgery. If you need help quitting, ask your health care provider. Contact a health care provider if: You have nausea or vomiting that does not get better with medicine. You vomit every time you eat or drink. You have pain that does not get better with medicine. You cannot urinate or have bloody urine. You develop a skin rash. You have a fever. Get help right  away if: You have trouble breathing. You have chest pain. You vomit blood. These symptoms may be an emergency. Get help right away. Call 911. Do not wait to see if the symptoms will go away. Do not drive yourself to the hospital. Summary After the procedure, it is common to have a sore throat, hoarseness, nausea, vomiting, or to feel weak, sleepy, or fatigue. For the time period you were told by your health care provider, do not drive or use machinery. Get help right away if you have difficulty breathing, have chest pain, or vomit blood. These symptoms may be an emergency. This information is not intended to replace advice given to you by your health care provider. Make sure you discuss any questions you have with your health care provider. Document Revised: 07/18/2021 Document Reviewed: 07/18/2021 Elsevier Patient Education  2024 [employer]. How to Use Chlorhexidine Before Surgery Chlorhexidine gluconate (CHG) is a germ-killing (antiseptic) solution that is used to clean the skin. It can get rid of the bacteria that normally live on the skin and can keep them away for about 24 hours. To clean your skin with CHG, you may be given: A CHG solution to use in the shower or as part of a sponge bath. A prepackaged cloth that contains CHG. Cleaning your skin with CHG may help lower the risk for infection: While you are staying in the intensive care unit of the hospital. If you have a vascular access, such as a central line, to provide short-term or long-term access to your veins. If you have a catheter to drain urine from your bladder. If you are on a ventilator. A ventilator is a machine that helps you breathe by moving air in and out of your lungs. After surgery. What are the risks? Risks of using CHG include: A skin reaction. Hearing loss, if CHG gets in your ears and you have a perforated eardrum. Eye injury, if CHG gets in your eyes and is not rinsed out. The CHG product catching  fire. Make sure that you avoid smoking and flames after applying CHG to your skin. Do not use CHG: If you have a chlorhexidine allergy or have previously reacted to chlorhexidine. On babies younger than 88 months of age. How to use CHG solution Use CHG only as told by your health care provider, and follow the instructions on the label. Use the full amount of CHG as directed. Usually, this is  one bottle. During a shower Follow these steps when using CHG solution during a shower (unless your health care provider gives you different instructions): Start the shower. Use your normal soap and shampoo to wash your face and hair. Turn off the shower or move out of the shower stream. Pour the CHG onto a clean washcloth. Do not use any type of brush or rough-edged sponge. Starting at your neck, lather your body down to your toes. Make sure you follow these instructions: If you will be having surgery, pay special attention to the part of your body where you will be having surgery. Scrub this area for at least 1 minute. Do not use CHG on your head or face. If the solution gets into your ears or eyes, rinse them well with water. Avoid your genital area. Avoid any areas of skin that have broken skin, cuts, or scrapes. Scrub your back and under your arms. Make sure to wash skin folds. Let the lather sit on your skin for 1-2 minutes or as long as told by your health care provider. Thoroughly rinse your entire body in the shower. Make sure that all body creases and crevices are rinsed well. Dry off with a clean towel. Do not put any substances on your body afterward--such as powder, lotion, or perfume--unless you are told to do so by your health care provider. Only use lotions that are recommended by the manufacturer. Put on clean clothes or pajamas. If it is the night before your surgery, sleep in clean sheets.  During a sponge bath Follow these steps when using CHG solution during a sponge bath (unless  your health care provider gives you different instructions): Use your normal soap and shampoo to wash your face and hair. Pour the CHG onto a clean washcloth. Starting at your neck, lather your body down to your toes. Make sure you follow these instructions: If you will be having surgery, pay special attention to the part of your body where you will be having surgery. Scrub this area for at least 1 minute. Do not use CHG on your head or face. If the solution gets into your ears or eyes, rinse them well with water. Avoid your genital area. Avoid any areas of skin that have broken skin, cuts, or scrapes. Scrub your back and under your arms. Make sure to wash skin folds. Let the lather sit on your skin for 1-2 minutes or as long as told by your health care provider. Using a different clean, wet washcloth, thoroughly rinse your entire body. Make sure that all body creases and crevices are rinsed well. Dry off with a clean towel. Do not put any substances on your body afterward--such as powder, lotion, or perfume--unless you are told to do so by your health care provider. Only use lotions that are recommended by the manufacturer. Put on clean clothes or pajamas. If it is the night before your surgery, sleep in clean sheets. How to use CHG prepackaged cloths Only use CHG cloths as told by your health care provider, and follow the instructions on the label. Use the CHG cloth on clean, dry skin. Do not use the CHG cloth on your head or face unless your health care provider tells you to. When washing with the CHG cloth: Avoid your genital area. Avoid any areas of skin that have broken skin, cuts, or scrapes. Before surgery Follow these steps when using a CHG cloth to clean before surgery (unless your health care provider gives you different  instructions): Using the CHG cloth, vigorously scrub the part of your body where you will be having surgery. Scrub using a back-and-forth motion for 3 minutes. The  area on your body should be completely wet with CHG when you are done scrubbing. Do not rinse. Discard the cloth and let the area air-dry. Do not put any substances on the area afterward, such as powder, lotion, or perfume. Put on clean clothes or pajamas. If it is the night before your surgery, sleep in clean sheets.  For general bathing Follow these steps when using CHG cloths for general bathing (unless your health care provider gives you different instructions). Use a separate CHG cloth for each area of your body. Make sure you wash between any folds of skin and between your fingers and toes. Wash your body in the following order, switching to a new cloth after each step: The front of your neck, shoulders, and chest. Both of your arms, under your arms, and your hands. Your stomach and groin area, avoiding the genitals. Your right leg and foot. Your left leg and foot. The back of your neck, your back, and your buttocks. Do not rinse. Discard the cloth and let the area air-dry. Do not put any substances on your body afterward--such as powder, lotion, or perfume--unless you are told to do so by your health care provider. Only use lotions that are recommended by the manufacturer. Put on clean clothes or pajamas. Contact a health care provider if: Your skin gets irritated after scrubbing. You have questions about using your solution or cloth. You swallow any chlorhexidine. Call your local poison control center ([phone] in the [place] Get help right away if: Your eyes itch badly, or they become very red or swollen. Your skin itches badly and is red or swollen. Your hearing changes. You have trouble seeing. You have swelling or tingling in your mouth or throat. You have trouble breathing. These symptoms may represent a serious problem that is an emergency. Do not wait to see if the symptoms will go away. Get medical help right away. Call your local emergency services (911 in the [place]  Do not drive yourself to the hospital. Summary Chlorhexidine gluconate (CHG) is a germ-killing (antiseptic) solution that is used to clean the skin. Cleaning your skin with CHG may help to lower your risk for infection. You may be given CHG to use for bathing. It may be in a bottle or in a prepackaged cloth to use on your skin. Carefully follow your health care provider's instructions and the instructions on the product label. Do not use CHG if you have a chlorhexidine allergy. Contact your health care provider if your skin gets irritated after scrubbing. This information is not intended to replace advice given to you by your health care provider. Make sure you discuss any questions you have with your health care provider. Document Revised: 08/18/2021 Document Reviewed: 07/01/2020 Elsevier Patient Education  2023 [employer].

## 2022-12-28 ENCOUNTER — Encounter (HOSPITAL_COMMUNITY): Admission: RE | Admit: 2022-12-28 | Payer: Medicare Other | Source: Ambulatory Visit

## 2022-12-28 ENCOUNTER — Encounter (HOSPITAL_COMMUNITY): Payer: Self-pay

## 2022-12-28 VITALS — BP 166/88 | HR 81 | Temp 97.8°F | Resp 16 | Ht 67.5 in | Wt 189.0 lb

## 2022-12-28 DIAGNOSIS — I10 Essential (primary) hypertension: Secondary | ICD-10-CM | POA: Insufficient documentation

## 2022-12-28 DIAGNOSIS — I493 Ventricular premature depolarization: Secondary | ICD-10-CM | POA: Diagnosis not present

## 2022-12-28 HISTORY — DX: Malignant neoplasm of pharynx, unspecified: C14.0

## 2022-12-30 ENCOUNTER — Ambulatory Visit (HOSPITAL_COMMUNITY): Payer: Medicare Other | Admitting: Certified Registered"

## 2022-12-30 ENCOUNTER — Encounter (HOSPITAL_COMMUNITY)
Admission: RE | Disposition: A | Discharge status: Home / Self Care | Payer: Self-pay | Source: Ambulatory Visit | Attending: General Surgery

## 2022-12-30 ENCOUNTER — Ambulatory Visit (HOSPITAL_BASED_OUTPATIENT_CLINIC_OR_DEPARTMENT_OTHER): Payer: Medicare Other | Admitting: Certified Registered"

## 2022-12-30 ENCOUNTER — Encounter (HOSPITAL_COMMUNITY): Payer: Self-pay | Admitting: General Surgery

## 2022-12-30 ENCOUNTER — Ambulatory Visit (HOSPITAL_COMMUNITY)
Admission: RE | Admit: 2022-12-30 | Discharge: 2022-12-30 | Disposition: A | Discharge status: Home / Self Care | Payer: Medicare Other | Source: Ambulatory Visit | Attending: General Surgery | Admitting: General Surgery

## 2022-12-30 DIAGNOSIS — I10 Essential (primary) hypertension: Secondary | ICD-10-CM | POA: Diagnosis not present

## 2022-12-30 DIAGNOSIS — Z85810 Personal history of malignant neoplasm of tongue: Secondary | ICD-10-CM | POA: Insufficient documentation

## 2022-12-30 DIAGNOSIS — Z85819 Personal history of malignant neoplasm of unspecified site of lip, oral cavity, and pharynx: Secondary | ICD-10-CM | POA: Diagnosis not present

## 2022-12-30 DIAGNOSIS — Z87891 Personal history of nicotine dependence: Secondary | ICD-10-CM | POA: Insufficient documentation

## 2022-12-30 DIAGNOSIS — E039 Hypothyroidism, unspecified: Secondary | ICD-10-CM | POA: Diagnosis not present

## 2022-12-30 DIAGNOSIS — Z923 Personal history of irradiation: Secondary | ICD-10-CM | POA: Diagnosis not present

## 2022-12-30 DIAGNOSIS — D0512 Intraductal carcinoma in situ of left breast: Secondary | ICD-10-CM | POA: Diagnosis not present

## 2022-12-30 DIAGNOSIS — K219 Gastro-esophageal reflux disease without esophagitis: Secondary | ICD-10-CM | POA: Diagnosis not present

## 2022-12-30 DIAGNOSIS — N641 Fat necrosis of breast: Secondary | ICD-10-CM | POA: Diagnosis not present

## 2022-12-30 DIAGNOSIS — R928 Other abnormal and inconclusive findings on diagnostic imaging of breast: Secondary | ICD-10-CM | POA: Diagnosis not present

## 2022-12-30 SURGERY — PARTIAL MASTECTOMY WITH RADIO FREQUENCY LOCALIZER
Anesthesia: General | Site: Breast | Laterality: Left

## 2022-12-30 MED ORDER — CHLORHEXIDINE GLUCONATE 0.12 % MT SOLN
15.0000 mL | Freq: Once | OROMUCOSAL | Status: AC
  Administered 2022-12-30: 15 mL via OROMUCOSAL

## 2022-12-30 MED ORDER — ONDANSETRON HCL 4 MG/2ML IJ SOLN
INTRAMUSCULAR | Status: DC | PRN
  Administered 2022-12-30: 4 mg via INTRAVENOUS

## 2022-12-30 MED ORDER — LIDOCAINE HCL (PF) 2 % IJ SOLN
INTRAMUSCULAR | Status: AC
  Filled 2022-12-30: qty 5

## 2022-12-30 MED ORDER — HYDROMORPHONE HCL 1 MG/ML IJ SOLN
INTRAMUSCULAR | Status: AC
  Filled 2022-12-30: qty 0.5

## 2022-12-30 MED ORDER — CEFAZOLIN SODIUM-DEXTROSE 2-4 GM/100ML-% IV SOLN
2.0000 g | INTRAVENOUS | Status: AC
  Administered 2022-12-30: 2 g via INTRAVENOUS
  Filled 2022-12-30: qty 100

## 2022-12-30 MED ORDER — FENTANYL CITRATE (PF) 100 MCG/2ML IJ SOLN
INTRAMUSCULAR | Status: DC | PRN
  Administered 2022-12-30: 50 ug via INTRAVENOUS
  Administered 2022-12-30 (×2): 25 ug via INTRAVENOUS

## 2022-12-30 MED ORDER — ONDANSETRON HCL 4 MG/2ML IJ SOLN: 4.0000 mg | Freq: Once | INTRAMUSCULAR | Status: DC | PRN

## 2022-12-30 MED ORDER — 0.9 % SODIUM CHLORIDE (POUR BTL) OPTIME
TOPICAL | Status: DC | PRN
  Administered 2022-12-30: 1000 mL

## 2022-12-30 MED ORDER — EPHEDRINE 5 MG/ML INJ
INTRAVENOUS | Status: AC
  Filled 2022-12-30: qty 5

## 2022-12-30 MED ORDER — ONDANSETRON HCL 4 MG/2ML IJ SOLN
INTRAMUSCULAR | Status: AC
  Filled 2022-12-30: qty 2

## 2022-12-30 MED ORDER — PROPOFOL 10 MG/ML IV BOLUS
INTRAVENOUS | Status: AC
  Filled 2022-12-30: qty 20

## 2022-12-30 MED ORDER — SUCCINYLCHOLINE CHLORIDE 200 MG/10ML IV SOSY
PREFILLED_SYRINGE | INTRAVENOUS | Status: DC | PRN
  Administered 2022-12-30: 100 mg via INTRAVENOUS

## 2022-12-30 MED ORDER — OXYCODONE HCL 5 MG PO TABS: 5.0000 mg | ORAL_TABLET | Freq: Once | ORAL | Status: DC | PRN

## 2022-12-30 MED ORDER — LACTATED RINGERS IV SOLN: INTRAVENOUS | Status: DC

## 2022-12-30 MED ORDER — EPHEDRINE SULFATE-NACL 50-0.9 MG/10ML-% IV SOSY
PREFILLED_SYRINGE | INTRAVENOUS | Status: DC | PRN
  Administered 2022-12-30: 10 mg via INTRAVENOUS

## 2022-12-30 MED ORDER — DEXAMETHASONE SODIUM PHOSPHATE 10 MG/ML IJ SOLN
INTRAMUSCULAR | Status: AC
  Filled 2022-12-30: qty 1

## 2022-12-30 MED ORDER — TRAMADOL HCL 50 MG PO TABS: 50.0000 mg | ORAL_TABLET | Freq: Four times a day (QID) | ORAL | 0 refills | Status: DC | PRN

## 2022-12-30 MED ORDER — PROPOFOL 10 MG/ML IV BOLUS
INTRAVENOUS | Status: DC | PRN
Start: 2022-12-30 — End: 2022-12-30
  Administered 2022-12-30: 120 mg via INTRAVENOUS
  Administered 2022-12-30: 60 mg via INTRAVENOUS

## 2022-12-30 MED ORDER — BUPIVACAINE HCL (PF) 0.5 % IJ SOLN
INTRAMUSCULAR | Status: DC | PRN
  Administered 2022-12-30: 30 mL

## 2022-12-30 MED ORDER — SUCCINYLCHOLINE CHLORIDE 200 MG/10ML IV SOSY
PREFILLED_SYRINGE | INTRAVENOUS | Status: AC
  Filled 2022-12-30: qty 10

## 2022-12-30 MED ORDER — OXYCODONE HCL 5 MG/5ML PO SOLN: 5.0000 mg | Freq: Once | ORAL | Status: DC | PRN

## 2022-12-30 MED ORDER — GLYCOPYRROLATE PF 0.2 MG/ML IJ SOSY
PREFILLED_SYRINGE | INTRAMUSCULAR | Status: AC
  Filled 2022-12-30: qty 1

## 2022-12-30 MED ORDER — PHENYLEPHRINE 80 MCG/ML (10ML) SYRINGE FOR IV PUSH (FOR BLOOD PRESSURE SUPPORT)
PREFILLED_SYRINGE | INTRAVENOUS | Status: AC
  Filled 2022-12-30: qty 10

## 2022-12-30 MED ORDER — CHLORHEXIDINE GLUCONATE CLOTH 2 % EX PADS: 6.0000 | MEDICATED_PAD | Freq: Once | CUTANEOUS | Status: DC

## 2022-12-30 MED ORDER — ORAL CARE MOUTH RINSE: 15.0000 mL | Freq: Once | OROMUCOSAL | Status: AC

## 2022-12-30 MED ORDER — ROCURONIUM BROMIDE 10 MG/ML (PF) SYRINGE
PREFILLED_SYRINGE | INTRAVENOUS | Status: AC
  Filled 2022-12-30: qty 10

## 2022-12-30 MED ORDER — DEXAMETHASONE SODIUM PHOSPHATE 10 MG/ML IJ SOLN
INTRAMUSCULAR | Status: DC | PRN
  Administered 2022-12-30: 10 mg via INTRAVENOUS
  Administered 2022-12-30: 5 mg via INTRAVENOUS
  Administered 2022-12-30: 10 mg via INTRAVENOUS

## 2022-12-30 MED ORDER — FENTANYL CITRATE (PF) 100 MCG/2ML IJ SOLN
INTRAMUSCULAR | Status: AC
  Filled 2022-12-30: qty 2

## 2022-12-30 MED ORDER — SEVOFLURANE IN SOLN
RESPIRATORY_TRACT | Status: AC
  Filled 2022-12-30: qty 250

## 2022-12-30 MED ORDER — PHENYLEPHRINE 80 MCG/ML (10ML) SYRINGE FOR IV PUSH (FOR BLOOD PRESSURE SUPPORT)
PREFILLED_SYRINGE | INTRAVENOUS | Status: DC | PRN
  Administered 2022-12-30: 1240 ug via INTRAVENOUS
  Administered 2022-12-30: 80 ug via INTRAVENOUS

## 2022-12-30 MED ORDER — FENTANYL CITRATE PF 50 MCG/ML IJ SOSY
25.0000 ug | PREFILLED_SYRINGE | INTRAMUSCULAR | Status: DC | PRN
  Administered 2022-12-30: 50 ug via INTRAVENOUS
  Filled 2022-12-30: qty 1

## 2022-12-30 MED ORDER — GLYCOPYRROLATE PF 0.2 MG/ML IJ SOSY
PREFILLED_SYRINGE | INTRAMUSCULAR | Status: DC | PRN
  Administered 2022-12-30: .1 mg via INTRAVENOUS

## 2022-12-30 MED ORDER — LIDOCAINE HCL (CARDIAC) PF 100 MG/5ML IV SOSY
PREFILLED_SYRINGE | INTRAVENOUS | Status: DC | PRN
  Administered 2022-12-30: 80 mg via INTRATRACHEAL

## 2022-12-30 SURGICAL SUPPLY — 32 items
ADH SKN CLS APL DERMABOND .7 (GAUZE/BANDAGES/DRESSINGS) ×1
CLOTH BEACON ORANGE TIMEOUT ST (SAFETY) ×1 IMPLANT
COVER LIGHT HANDLE STERIS (MISCELLANEOUS) ×2 IMPLANT
COVER PROBE W GEL 5X96 (DRAPES) ×1 IMPLANT
DERMABOND ADVANCED .7 DNX12 (GAUZE/BANDAGES/DRESSINGS) IMPLANT
DEVICE DUBIN SPECIMEN MAMMOGRA (MISCELLANEOUS) ×1 IMPLANT
DURAPREP 26ML APPLICATOR (WOUND CARE) ×1 IMPLANT
ELECT REM PT RETURN 9FT ADLT (ELECTROSURGICAL) ×1
ELECTRODE REM PT RTRN 9FT ADLT (ELECTROSURGICAL) ×1 IMPLANT
GLOVE BIOGEL PI IND STRL 7.0 (GLOVE) ×2 IMPLANT
GLOVE SURG SS PI 7.5 STRL IVOR (GLOVE) ×2 IMPLANT
GOWN STRL REUS W/TWL LRG LVL3 (GOWN DISPOSABLE) ×2 IMPLANT
KIT TURNOVER KIT A (KITS) ×1 IMPLANT
MANIFOLD NEPTUNE II (INSTRUMENTS) ×1 IMPLANT
NDL HYPO 18GX1.5 BLUNT FILL (NEEDLE) ×1 IMPLANT
NDL HYPO 21X1.5 SAFETY (NEEDLE) ×1 IMPLANT
NEEDLE HYPO 18GX1.5 BLUNT FILL (NEEDLE) ×1
NEEDLE HYPO 21X1.5 SAFETY (NEEDLE) ×1
NS IRRIG 1000ML POUR BTL (IV SOLUTION) ×1 IMPLANT
PACK MINOR (CUSTOM PROCEDURE TRAY) ×1 IMPLANT
PAD ARMBOARD 7.5X6 YLW CONV (MISCELLANEOUS) ×1 IMPLANT
POSITIONER HEAD 8X9X4 ADT (SOFTGOODS) ×1 IMPLANT
SET BASIN LINEN APH (SET/KITS/TRAYS/PACK) ×1 IMPLANT
SET LOCALIZER 20 PROBE US (MISCELLANEOUS) ×1 IMPLANT
SPONGE T-LAP 18X18 ~~LOC~~+RFID (SPONGE) ×1 IMPLANT
SUT MNCRL AB 4-0 PS2 18 (SUTURE) ×2 IMPLANT
SUT SILK 3 0 (SUTURE) ×1
SUT SILK 3-0 FS1 18XBRD (SUTURE) ×1 IMPLANT
SUT VIC AB 3-0 SH 27 (SUTURE) ×2
SUT VIC AB 3-0 SH 27X BRD (SUTURE) ×2 IMPLANT
SYR 30ML LL (SYRINGE) ×2 IMPLANT
SYR BULB IRRIG 60ML STRL (SYRINGE) ×1 IMPLANT

## 2022-12-30 NOTE — Interval H&P Note (Signed)
History and Physical Interval Note:  12/30/2022 8:12 AM  Candace Lewis  has presented today for surgery, with the diagnosis of LEFT BREAST CANCER DCIS.  The various methods of treatment have been discussed with the patient and family. After consideration of risks, benefits and other options for treatment, the patient has consented to  Procedure(s): PARTIAL MASTECTOMY WITH RADIO FREQUENCY LOCALIZER (Left) as a surgical intervention.  The patient's history has been reviewed, patient examined, no change in status, stable for surgery.  I have reviewed the patient's chart and labs.  Questions were answered to the patient's satisfaction.     Franky Macho

## 2022-12-30 NOTE — Anesthesia Postprocedure Evaluation (Signed)
Anesthesia Post Note  Patient: LEOLA UPDEGROVE  Procedure(s) Performed: PARTIAL MASTECTOMY WITH RADIO FREQUENCY LOCALIZER (Left: Breast)  Patient location during evaluation: Phase II Anesthesia Type: General Level of consciousness: awake Pain management: pain level controlled Vital Signs Assessment: post-procedure vital signs reviewed and stable Respiratory status: spontaneous breathing and respiratory function stable Cardiovascular status: blood pressure returned to baseline and stable Postop Assessment: no headache and no apparent nausea or vomiting Anesthetic complications: yes Comments: Late entry   Encounter Notable Events  Notable Event Outcome Phase Comment  Difficult to intubate - unexpected  Intraprocedure Filed from anesthesia note documentation.     Last Vitals:  Vitals:   12/30/22 1015 12/30/22 1030  BP: (!) 111/51 116/61  Pulse: 81 83  Resp: 12 18  Temp:    SpO2: 99% 95%    Last Pain:  Vitals:   12/30/22 1039  TempSrc:   PainSc: 2                  Windell Norfolk

## 2022-12-30 NOTE — Op Note (Signed)
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  The risks and benefits of the procedure including bleeding, infection, and positive margins were fully explained to the patient, who gave informed consent.  Procedure note: The patient was placed in the supine position.  After general anesthesia was administered, the left breast was prepped and draped using the usual sterile technique with ChloraPrep.  Surgical site confirmation was performed.  The patient had a previous upper transverse surgical scar present in the left breast.  Using the Hologic localizer, an incision was made through the previous surgical scar and was taken down to the area of the radiofrequency tag.  The posterior margin was the chest wall.  A short suture was placed superiorly and a long suture placed laterally for orientation purposes.  Once the specimen was removed, it was sent to radiology for specimen radiography.  The radiofrequency tag and clip were within the specimen removed.  It was then sent to pathology further examination.  A bleeding was controlled using Bovie electrocautery.  0.5% Sensorcaine was instilled into the surrounding wound.  The subcutaneous layer was reapproximated using a 3-0 Vicryl interrupted suture.  The skin was closed using a 4-0 Monocryl subcuticular suture.  Dermabond was applied.  All tape and needle counts were correct at the end of the procedure.  The patient was awakened and transferred to PACU in stable condition.  Complications: None  EBL: Minimal  Specimen: Left breast tissue

## 2022-12-30 NOTE — Anesthesia Preprocedure Evaluation (Signed)
Anesthesia Evaluation  Patient identified by MRN, date of birth, ID band Patient awake    Reviewed: Allergy & Precautions, H&P , NPO status , Patient's Chart, lab work & pertinent test results, reviewed documented beta blocker date and time   Airway Mallampati: II  TM Distance: >3 FB Neck ROM: full    Dental no notable dental hx.    Pulmonary neg pulmonary ROS, former smoker   Pulmonary exam normal breath sounds clear to auscultation       Cardiovascular Exercise Tolerance: Good hypertension, negative cardio ROS  Rhythm:regular Rate:Normal     Neuro/Psych  Headaches  Neuromuscular disease negative neurological ROS  negative psych ROS   GI/Hepatic negative GI ROS, Neg liver ROS,GERD  ,,  Endo/Other  negative endocrine ROSHypothyroidism    Renal/GU negative Renal ROS  negative genitourinary   Musculoskeletal   Abdominal   Peds  Hematology negative hematology ROS (+)   Anesthesia Other Findings   Reproductive/Obstetrics negative OB ROS                             Anesthesia Physical Anesthesia Plan  ASA: 2  Anesthesia Plan: General and General LMA   Post-op Pain Management:    Induction:   PONV Risk Score and Plan: Ondansetron  Airway Management Planned:   Additional Equipment:   Intra-op Plan:   Post-operative Plan:   Informed Consent: I have reviewed the patients History and Physical, chart, labs and discussed the procedure including the risks, benefits and alternatives for the proposed anesthesia with the patient or authorized representative who has indicated his/her understanding and acceptance.     Dental Advisory Given  Plan Discussed with: CRNA  Anesthesia Plan Comments:        Anesthesia Quick Evaluation

## 2022-12-30 NOTE — Anesthesia Procedure Notes (Addendum)
Procedure Name: Intubation Date/Time: 12/30/2022 8:46 AM  Performed by: Oletha Cruel, CRNAPre-anesthesia Checklist: Patient identified, Emergency Drugs available, Suction available, Patient being monitored and Timeout performed Patient Re-evaluated:Patient Re-evaluated prior to induction Oxygen Delivery Method: Circle system utilized Preoxygenation: Pre-oxygenation with 100% oxygen Induction Type: IV induction Ventilation: Mask ventilation without difficulty Laryngoscope Size: Glidescope and 3 Grade View: Grade I Tube type: Oral Tube size: 7.0 mm Number of attempts: 1 (Laryngoscopy x 2. Grade IV view,) Airway Equipment and Method: Video-laryngoscopy Placement Confirmation: ETT inserted through vocal cords under direct vision, CO2 detector, positive ETCO2 and breath sounds checked- equal and bilateral Secured at: 22 cm Tube secured with: Tape Dental Injury: Teeth and Oropharynx as per pre-operative assessment  Difficulty Due To: Difficulty was unanticipated Comments: Patient edentulous. LMA size 4 unsuccessfully placed x 2. Unable to "seat" properly with inadequate ventilation. Oropharynx with bloody secretions. Oropharynx suctioned. Plan converted to intubation. Laryngoscopy x 2 with Grade IV view. Converted to use of Glidescope. Grade I anterior view of larynx. OETT placed on first attempt. Decadron administered.

## 2022-12-30 NOTE — Transfer of Care (Addendum)
Immediate Anesthesia Transfer of Care Note  Patient: Candace Lewis  Procedure(s) Performed: PARTIAL MASTECTOMY WITH RADIO FREQUENCY LOCALIZER (Left: Breast)  Patient Location: PACU  Anesthesia Type:General  Level of Consciousness: awake and patient cooperative  Airway & Oxygen Therapy: Patient Spontanous Breathing and Patient connected to face mask oxygen  Post-op Assessment: Report given to RN and Post -op Vital signs reviewed and stable  Post vital signs: Reviewed and stable  Last Vitals:  Vitals Value Taken Time  BP 137/66 12/30/22   0951  Temp 35.9 12/30/22   0951  Pulse 91 12/30/22 0951  Resp 18 12/30/22 0951  SpO2 97 % 12/30/22 0951  Vitals shown include unfiled device data.  Last Pain:  Vitals:   12/30/22 0700  TempSrc: Oral  PainSc: 0-No pain      Patients Stated Pain Goal: 7 (12/30/22 0700)  Complications:  Encounter Notable Events  Notable Event Outcome Phase Comment  Difficult to intubate - unexpected  Intraprocedure Filed from anesthesia note documentation.

## 2022-12-30 NOTE — Discharge Instructions (Signed)
No heavy lifting until the doctor says otherwise.  Keep incision clean and dry.  You can shower starting tomorrow, no tub baths or submerging incision site under water. Resume normal diet. Call doctor for any signs of infection including redness, foul odor, drainage, fever or chills. Call doctor for any other concerns or questions.

## 2023-01-07 ENCOUNTER — Ambulatory Visit (INDEPENDENT_AMBULATORY_CARE_PROVIDER_SITE_OTHER): Payer: Medicare Other | Admitting: General Surgery

## 2023-01-07 ENCOUNTER — Encounter: Payer: Self-pay | Admitting: General Surgery

## 2023-01-07 VITALS — BP 114/66 | HR 80 | Temp 98.1°F | Resp 14 | Ht 67.5 in | Wt 184.0 lb

## 2023-01-07 DIAGNOSIS — Z09 Encounter for follow-up examination after completed treatment for conditions other than malignant neoplasm: Secondary | ICD-10-CM

## 2023-01-07 LAB — SURGICAL PATHOLOGY

## 2023-01-07 NOTE — Progress Notes (Signed)
Subjective:     Candace Lewis  Patient here for postoperative visit, status post left partial mastectomy for DCIS.  She is doing well.  She does have some swelling of the left breast.  No drainage has been noted from the wound. Objective:    BP 114/66   Pulse 80   Temp 98.1 F (36.7 C) (Oral)   Resp 14   Ht 5' 7.5" (1.715 m)   Wt 184 lb (83.5 kg)   SpO2 98%   BMI 28.39 kg/m   General:  alert, cooperative, and no distress  Left breast incision healing well.  Resolving ecchymosis present.  No tense seroma or hematoma in the incision line.    Results Surgical pathology (Order [id number]) MyChart Results Release  MyChart Status: Active  Results Release   Cervical Cancer Screening - Results and Follow-ups Selected result Result date Tests and Procedures Follow-ups  12/30/2022  Surgical pathology  SURGICAL PATHOLOGY: SURGICAL PATHOLOGY CASE: [id number] PATIENT: Candace Lewis Surgical Pathology Report     Clinical History: Left breast cancer DCIS (las)     FINAL MICROSCOPIC DIAGNOSIS:  A. BREAST, LEFT, EXCISION: Focal residual ductal carcinoma in ...   Cervical Cancer Screening History Report Surgical pathology Order: [id number] Status: Edited Result - FINAL     Visible to patient: Yes (not seen)     Next appt: 02/02/2023 at 02:30 PM in Oncology Doreatha Massed, MD)   0 Result Notes    Component 8 d ago  SURGICAL PATHOLOGY SURGICAL PATHOLOGY CASE: [id number] PATIENT: Candace Lewis Surgical Pathology Report     Clinical History: Left breast cancer DCIS (las)     FINAL MICROSCOPIC DIAGNOSIS:  A. BREAST, LEFT, EXCISION: Focal residual ductal carcinoma in situ, type, solid, nuclear grade 3/3, with comedonecrosis DCIS greatest dimension: 6 mm Margins:  negative     Closest margin: medial, 5 mm Prognostic markers: ER negative 0%, PR negative 0% Biopsy site and clip Other findings: none See oncology table  ONCOLOGY  TABLE:   DCIS OF THE BREAST:  Resection Procedure: Lumpectomy Specimen Laterality: Left Histologic Type: Ductal carcinoma, solid type with comedonecrosis, nuclear grade 3 of 3 Size of DCIS: Interpreted as 6 mm (rare residual ducts on 2 sections) Nuclear Grade: 3 of 3 Necrosis: Comedonecrosis present Margins: All margins negative for DCIS      Specify Closest Margin (required only if <43mm): Medial at 5 mm Regional Lymph Nodes: N/A; no lymph nodes submitted or found      Number of Lymph Nodes Examined: 0      Number of Sentinel Nodes Examined (if applicable): 0      Number of Lymph Nodes with Macrometastases: N/A      Number of Lymph Nodes with Micrometastases): N/A      Number of Lymph Nodes with Isolated Tumor Cells (=0.2 mm or =200 cells): N/A      Extranodal Extension: N/A Breast Biomarker Testing Performed on Previous Biopsy:      Testing performed on Case Number: [id number]      Estrogen Receptor: Negative, 0%      Progesterone Receptor: Negative, 0% Pathologic Stage Classification (pTNM, AJCC 8th Edition): pTis, pNX Representative Tumor Block: A15 Comment(s): none (v4.4.0.0)  GROSS DESCRIPTION:  A. Specimen type: received fresh and subsequently placed in formalin labeled with the patient's name and DOB is a piece of fibrofatty tissue grossly consistent with clinically stated lumpectomy. Time out of body/TIF: not recorded in OR. Size: 8.7 cm superior-inferior, 6.9 cm anterior-posterior, and 3.4 cm medial-lateral.  Orientation: the specimen has been previously inked in the OR as follows: anterior - green, inferior - blue, lateral - orange, medial - yellow, posterior - black, and superior - red. Localized area: a radiofrequency tag is adjacent to an X-shaped biopsy clip. Cut surface: a 2.4 x 0.8 x 0.4 cm ill-defined, white-tan, indurated lesion surrounds the X-shaped biopsy clip. Margins: superior - 2.0 cm, inferior - 4.1 cm, medial - 1.3 cm, lateral - 1.3 cm, anterior  - 2.2 cm, and posterior - 2.8 cm. The lesion is entirely and sequentially submitted from superior to inferior as follows: A1: superior end, perpendicular A2-A13: entire lesion (lateral margin in most cassettes, anterior margin in 12 and 13) A14: inferior end, perpendicular A15: closest medial margin (RFID) A16: closest posterior margin (LEF 12/31/2022)   Final Diagnosis performed by Orene Desanctis DO.   Electronically signed 01/01/2023 Technical component performed at [facility], [address], [place], [place] [address].  Professional component performed at [facility], [address] [place], [place] [address].  Immunohistochemistry Technical component (if applicable) was performed at [facility]. [address], [place], [place] [address].   IMMUNOHISTOCHEMISTRY DISCLAIMER (if applicable): Some of these immunohistochemical stains may have been developed and the performance characteristics determine by [facility]. Some may not have been cleared or approved by the U.S. Food and Drug Administration. The FDA has determined that such clearance or approval is not necessary. This test is used for clinical purposes. It should not be regarded as investigational or for research. This laboratory is certified under the Clinical Laboratory Improvement Amendments of 1988 (CLIA-88) as qualified to perform high complexity clinical laboratory testing.  The controls stained appropriately.   IHC stains are performed on formalin fixed, paraffin embedded tissue using a 3,3"diaminobenzidine (DAB) chromogen and Leica Bond Autostainer System. The staining intensity of the nucleus is score manually and is reported as the percentage of tumor cell nuclei demonstrating specific nuclear staining. The specimens are fixed in 10% Neutral Formalin for at least 6 hours and up to 72hrs. These tests are validated on decalcified  tissue. Results should be interpreted with caution given the possibility of false negative results on decalcified specimens. Antibody Clones are as follows ER-clone 67F, PR-clone 16, Ki67- clone MM1. Some of these immunohistochemical stains may have been developed and the performance characteristics determined by [facility].  Resulting Agency [facility]         Specimen Collected: 12/30/22 09:18 Last Resulted: 01/07/23 08:25      Lab Flowsheet      Order Details      View Encounter      Lab and Collection Details      Routing      Result History    View All Conversations on this Encounter        Linked Documents  View Image    Result Care Coordination   Patient Communication   Add Comments   Add Notifications  Back to Top     In Basket Actions   important suggestion  This result has an associated conversation. Click the link below to view in In Basket.   Done Result Mgmt   View in In Basket Result Information  Status Priority Source  Edited Result - FINAL (01/07/2023 0825) Timed PATH Breast lump/simple mastectomy   Authorizing Provider Information  Name: Franky Macho, MD Fax: [phone]  Phone: [phone] Pager:    Status of Active Orders View All Orders  From This EncounterExpected   Increase activity slowly [[id number] Custom] 12/30/22  Diet - low sodium heart healthy [DIET9 Custom] 12/30/22   View SmartLink Info  Surgical pathology (Order #[id number]) on 12/30/22       Assessment:    Doing well postoperatively.    Plan:   Patient has follow-up with both Dr. Ellin Saba of oncology and radiation oncology.  Follow-up here as needed.

## 2023-01-27 DIAGNOSIS — D0512 Intraductal carcinoma in situ of left breast: Secondary | ICD-10-CM | POA: Diagnosis not present

## 2023-01-29 DIAGNOSIS — D0512 Intraductal carcinoma in situ of left breast: Secondary | ICD-10-CM | POA: Diagnosis not present

## 2023-02-02 ENCOUNTER — Inpatient Hospital Stay: Payer: Medicare Other | Attending: Physician Assistant | Admitting: Hematology

## 2023-02-02 VITALS — BP 127/83 | HR 87 | Temp 98.3°F | Resp 19 | Ht 67.5 in | Wt 186.8 lb

## 2023-02-02 DIAGNOSIS — C099 Malignant neoplasm of tonsil, unspecified: Secondary | ICD-10-CM

## 2023-02-02 DIAGNOSIS — E039 Hypothyroidism, unspecified: Secondary | ICD-10-CM | POA: Diagnosis not present

## 2023-02-02 DIAGNOSIS — D0511 Intraductal carcinoma in situ of right breast: Secondary | ICD-10-CM | POA: Diagnosis not present

## 2023-02-02 DIAGNOSIS — D0512 Intraductal carcinoma in situ of left breast: Secondary | ICD-10-CM

## 2023-02-02 DIAGNOSIS — Z87891 Personal history of nicotine dependence: Secondary | ICD-10-CM | POA: Diagnosis not present

## 2023-02-02 DIAGNOSIS — M8580 Other specified disorders of bone density and structure, unspecified site: Secondary | ICD-10-CM | POA: Diagnosis not present

## 2023-02-02 DIAGNOSIS — Z801 Family history of malignant neoplasm of trachea, bronchus and lung: Secondary | ICD-10-CM | POA: Diagnosis not present

## 2023-02-02 DIAGNOSIS — Z85810 Personal history of malignant neoplasm of tongue: Secondary | ICD-10-CM | POA: Diagnosis not present

## 2023-02-02 DIAGNOSIS — Z9221 Personal history of antineoplastic chemotherapy: Secondary | ICD-10-CM | POA: Insufficient documentation

## 2023-02-02 DIAGNOSIS — Z79811 Long term (current) use of aromatase inhibitors: Secondary | ICD-10-CM | POA: Diagnosis not present

## 2023-02-02 DIAGNOSIS — Z170 Estrogen receptor positive status [ER+]: Secondary | ICD-10-CM | POA: Diagnosis not present

## 2023-02-02 NOTE — Progress Notes (Signed)
[facility] [address], [place] [address]    Clinic Day:  02/02/23   Referring physician: Anabel Halon, MD  Patient Care Team: Anabel Halon, MD as PCP - General (Internal Medicine) Marjo Bicker, MD as PCP - Cardiology (Cardiology) Jena Gauss Gerrit Friends, MD as Consulting Physician (Gastroenterology) Doreatha Massed, MD as Medical Oncologist (Medical Oncology) Therese Sarah, RN as Oncology Nurse Navigator (Medical Oncology)   ASSESSMENT & PLAN:   Assessment: Right breast DCIS, high-grade: - She felt a lump in the right breast UOQ 2 months ago with some soreness. - Mammogram (10/28/2021) calcifications in the UOQ right breast. - Biopsy (11/17/2021): High-grade DCIS, solid type with comedonecrosis.  Negative for invasive carcinoma.  Microcalcification present.  DCIS measures 6 mm in greatest linear extent.  ER 60% positive and PR 1% positive. - She had history of fibrocystic disease and had previously cyst removals x3 which were benign. - Right breast lumpectomy on 12/15/2021 - Pathology: Focus of microinvasive carcinoma, less than 1 mm arising in a background of high-grade DCIS with necrosis and calcifications.  Margins negative for invasive carcinoma.  DCIS is focally less than 1 mm from posterior margin.  ER 60% and PR 1% positive.  PT42mi, PNx. - XRT to the right breast from 02/02/2022 through 02/13/2022 - Anastrozole started 01/06/2022    Social/family history: - She lives at home by herself.  She is seen with her granddaughter.  She is independent of ADLs and IADLs.  She did construction/electrical work.  Quit smoking in 1984. - Maternal grandfather had lung cancer.  3.  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  Plan: 1.  Right breast DCIS with microinvasion, ER/PR positive: - Continue anastrozole daily. - RTC 6 months for follow-up.   2.  Osteopenia  (DEXA 12/25/2021 T-score -1.7): - Continue calcium and vitamin D supplements.  Last vitamin D was normal.   3.  Nutrition: - She cannot eat any solid food since she completed chemoradiation in December 2015.  Continue 4 ensures and 8 ounces of milk per day.  Weight is stable.   4.  Hypothyroidism: - Continue Synthroid 88 mcg daily.  Will repeat TSH at next visit.  5.  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  ER/PR negative.  We discussed the new diagnosis in detail. - She was evaluated by Dr. Langston Masker.  She will start XRT, 5 treatments on 02/11/2023.  No indication for antiestrogen therapy.  Orders Placed This Encounter  Procedures   CBC with Differential/Platelet    Standing Status:   Future    Standing Expiration Date:   02/02/2024    Order Specific Question:   Release to patient    Answer:   Immediate   Comprehensive metabolic panel    Standing Status:   Future    Standing Expiration Date:   02/02/2024    Order Specific Question:   Release to patient    Answer:   Immediate   VITAMIN D 25 Hydroxy (Vit-D Deficiency, Fractures)    Standing Status:   Future    Standing Expiration Date:   02/02/2024    Order Specific Question:   Release to patient    Answer:   Immediate   TSH    Standing Status:   Future    Standing Expiration Date:   02/02/2024  Order Specific Question:   Release to patient    Answer:   Immediate      I,Helena R Teague,acting as a scribe for Doreatha Massed, MD.,have documented all relevant documentation on the behalf of Doreatha Massed, MD,as directed by  Doreatha Massed, MD while in the presence of Doreatha Massed, MD.  I, Doreatha Massed MD, have reviewed the above documentation for accuracy and completeness, and I agree with the above.    Doreatha Massed, MD   10/1/20244:36 PM  CHIEF COMPLAINT:   Diagnosis: Ductal carcinoma in situ (DCIS)  of right breast    Cancer Staging  Ductal carcinoma in situ (DCIS) of left breast Staging form: Breast, AJCC 8th Edition - Clinical stage from 11/26/2022: Stage 0 (cTis (DCIS), cN0, cM0, ER-, PR-, HER2: Not Assessed) - Signed by Doreatha Massed, MD on 11/26/2022  Ductal carcinoma in situ (DCIS) of right breast Staging form: Breast, AJCC 8th Edition - Clinical stage from 11/25/2021: Stage 0 (cTis (DCIS), cN0, cM0, ER+, PR+, HER2: Not Assessed) - Unsigned    Prior Therapy: 1.  Right breast lumpectomy on 12/15/2021 2.  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   01/19/2014 PET scan   Tongue mass with 2 level II Right neck nodes T2N2b, Stage IVA   02/08/2014 - 04/03/2014 Radiation Therapy   Initiation of radiation therapy with 70 Gy delivered to the right base of tongue/tonsil 63 Gy to high risk nodal echelons, 56 Gy to the intermediate r base of tongue/tonsil/bilateral neck nodes   02/08/2014 - 03/15/2014 Chemotherapy   weekly cisplatin, only 4 doses given, severe toxicity with neutropenia, dehydration, nausea, vomiting, hospitalization and obstipation   10/24/2015 PET scan   low level non malig range hyper metab in L apical sub solid pulm nodule, suspicious for low grade adeno. diffuse tongue hypermetab without CT correlation   10/28/2015 Procedure   Bronchoscopy navigation, electromagnetic navigation, bronchoscopy with biopsy of LUL lung lesion, placement of fiducial markers X 3   10/28/2015 Pathology Results   Scant benign lung tissue, no tumor seen   Ductal carcinoma in situ (DCIS) of left breast  11/26/2022 Initial Diagnosis   Ductal carcinoma in situ (DCIS) of left breast   11/26/2022 Cancer Staging   Staging form: Breast, AJCC 8th Edition - Clinical stage from 11/26/2022: Stage 0 (cTis (DCIS),  cN0, cM0, ER-, PR-, HER2: Not Assessed) - Signed by Doreatha Massed, MD on 11/26/2022 Histopathologic type: Intraductal carcinoma, noninfiltrating, NOS Stage prefix: Initial diagnosis Nuclear grade: G3      INTERVAL HISTORY:   Candace Lewis is a 82 y.o. female presenting to clinic today for follow up of  high-grade right breast DCIS, ER/PR positive. She was last seen by me on 11/26/22.  Since her last visit, she underwent a partial left mastectomy on 12/30/22 with Dr. Lovell Sheehan. Surgical pathology of the left breast excision revealed: solid, grade 3 focal residual ductal carcinoma in situ with DCIS. She was last seen by Dr. Langston Masker on 01/27/23 and Dr. Imogene Burn, radiology, on 01/29/23.   Today, she states that she is doing well overall.  Her energy level is at 40%.  She has an appointment with radiology to discuss XRT on 02/11/23 for most likely 5 treatments. She is taking Synthroid as prescribed. She has a follow-up with Dr. Lovell Sheehan on 02/11/23.  PAST MEDICAL HISTORY:   Past Medical History: Past Medical History:  Diagnosis Date   Arthritis    lower back   Constipation    Diverticulitis    Ectopic pregnancy 1977   Esophageal cancer (HCC) 2015   tongue cancer, throat cancer   GERD (gastroesophageal reflux disease)    Headache    migraines   Hypertension    no longer taking meds   Hypokalemia 2015   Hypothyroidism    Lung nodule, multiple 01/08/2015   Macular degeneration of right eye    Malignant neoplasm of tongue (HCC) 01/24/2014   Throat cancer (HCC)    Thyroid disease     Surgical History: Past Surgical History:  Procedure Laterality Date   BIOPSY PHARYNX  2015   BREAST BIOPSY Bilateral    benign   BREAST BIOPSY Right 11/10/2021   HIGH-GRADE DUCTAL CARCINOMA IN SITU, SOLID TYPE WITH COMEDONECROSIS. MICROCALCIFICATIONS PRESENT.   BREAST BIOPSY Left 11/16/2022   FOCI OF DUCTAL CARCINOMA IN SITU, HIGH GRADE - NECROSIS: PRESENT, PROMINENT - CALCIFICATIONSMM LT BREAST BX W  LOC DEV 1ST LESION IMAGE BX SPEC STEREO GUIDE 11/16/2022 [facility]   CHOLECYSTECTOMY  2005   ESOPHAGOGASTRODUODENOSCOPY  06/19/2014   Dr. Teena Dunk: LA Class B esophagitis. PEG tube in place. erythema in proximal esophagus.    ESOPHAGOGASTRODUODENOSCOPY (EGD) WITH PROPOFOL N/A 03/11/2017   Web in upper third of esophagus s/p dilation with scope passage, small hiatal hernia, normal duodenum, no specimens collected   FUDUCIAL PLACEMENT N/A 10/28/2015   Procedure: PLACEMENT OF FUDUCIAL TIMES THREE; LEFT UPPER LOBE BIOPSY;  Surgeon: Delight Ovens, MD;  Location: [facility] OR;  Service: Thoracic;  Laterality: N/A;   MASTECTOMY, PARTIAL Left 1984   no cancer, fibrocystic breast disease   SKIN SURGERY  1960   moles removed from various areas per patient report   TONSILLECTOMY  1950   per patient report   TUBAL LIGATION  1976   VAGINAL HYSTERECTOMY  1981   vaginal hyst per patient report   VIDEO BRONCHOSCOPY WITH ENDOBRONCHIAL NAVIGATION N/A 10/28/2015   Procedure: VIDEO BRONCHOSCOPY WITH ENDOBRONCHIAL NAVIGATION;  Surgeon: Delight Ovens, MD;  Location: [facility] OR;  Service: Thoracic;  Laterality: N/A;    Social History: Social History   Socioeconomic History   Marital status: Widowed    Spouse name: Not on file   Number of children: 2   Years of education: Not on file   Highest education level: Not on file  Occupational History   Occupation: retired  Tobacco Use   Smoking status: Former    Current packs/day: 0.00    Average packs/day: 1 pack/day for 23.0 years (23.0 ttl pk-yrs)    Types: Cigarettes    Start date: 05/04/1959    Quit date: 05/03/1982    Years since quitting: 40.7   Smokeless tobacco: Never  Vaping Use   Vaping status: Never Used  Substance and Sexual Activity   Alcohol use: No    Alcohol/week: 0.0 standard drinks of alcohol   Drug use: No   Sexual activity: Never    Birth control/protection: None  Other Topics Concern   Not on file  Social History Narrative    Not on file   Social Determinants of Health   Financial Resource Strain: Low Risk  (06/09/2021)   Received from [facility], [facility]   Overall Financial Resource Strain (CARDIA)    Difficulty of Paying Living Expenses: Not hard at all  Food Insecurity: Food Insecurity Present (  06/09/2021)   Received from [facility], [facility]   Hunger Vital Sign    Worried About Running Out of Food in the Last Year: Sometimes true    Ran Out of Food in the Last Year: Never true  Transportation Needs: No Transportation Needs (06/09/2021)   Received from [facility], [facility]   [facility] - Transportation    Lack of Transportation (Medical): No    Lack of Transportation (Non-Medical): No  Physical Activity: Not on file  Stress: Not on file  Social Connections: Not on file  Intimate Partner Violence: Not on file    Family History: Family History  Problem Relation Age of Onset   Heart disease Mother     Current Medications:  Current Outpatient Medications:    amitriptyline (ELAVIL) 10 MG tablet, Take 1 tablet (10 mg total) by mouth at bedtime., Disp: 30 tablet, Rfl: 0   anastrozole (ARIMIDEX) 1 MG tablet, Take 1 tablet (1 mg total) by mouth daily. Dissolve in water and drink once daily., Disp: 90 tablet, Rfl: 3   Aspirin-Acetaminophen (GOODYS BACK & BODY PAIN) 500-325 MG PACK, Take 1 packet by mouth daily as needed (pain)., Disp: , Rfl:    diclofenac Sodium (VOLTAREN) 1 % GEL, Apply 2 g topically 4 (four) times daily. Apply to painful area around right ribs and right breast. (Patient taking differently: Apply 2 g topically 4 (four) times daily as needed (pain). Apply to painful area around right ribs and right breast.), Disp: 50 g, Rfl: 0   levothyroxine (SYNTHROID) 88 MCG tablet, TAKE 1 TABLET BY MOUTH BEFORE BREAKFAST, Disp: 90 tablet, Rfl: 0   metoprolol tartrate (LOPRESSOR) 25 MG tablet, Take 1 tablet (25 mg total) by mouth 2 (two) times daily. (Patient taking  differently: Take 12.5-25 mg by mouth See admin instructions. 25 mg in the morning, 12.5 mg at bedtime), Disp: 180 tablet, Rfl: 3   omeprazole (PRILOSEC OTC) 20 MG tablet, Take 20 mg by mouth daily., Disp: , Rfl:    rosuvastatin (CRESTOR) 20 MG tablet, Take 20 mg by mouth every evening., Disp: , Rfl:    traMADol (ULTRAM) 50 MG tablet, Take 1 tablet (50 mg total) by mouth every 6 (six) hours as needed., Disp: 15 tablet, Rfl: 0   triamcinolone cream (KENALOG) 0.1 %, Apply 1 Application topically 2 (two) times daily. (Patient taking differently: Apply 1 Application topically 2 (two) times daily as needed (irritation/itching).), Disp: 30 g, Rfl: 0   trolamine salicylate (ASPERCREME) 10 % cream, Apply 1 Application topically as needed for muscle pain., Disp: , Rfl:    Allergies: Allergies  Allergen Reactions   Adhesive [Tape] Rash    Reports rash and blistering if any adhesive tape   Latex Rash    Rash and skin blistering per patient report    REVIEW OF SYSTEMS:   Review of Systems  Constitutional:  Negative for chills, fatigue and fever.  HENT:   Positive for trouble swallowing. Negative for lump/mass, mouth sores, nosebleeds and sore throat.   Eyes:  Negative for eye problems.  Respiratory:  Positive for cough. Negative for shortness of breath.   Cardiovascular:  Positive for chest pain and palpitations. Negative for leg swelling.  Gastrointestinal:  Positive for nausea (occasional). Negative for abdominal pain, constipation, diarrhea and vomiting.  Genitourinary:  Negative for bladder incontinence, difficulty urinating, dysuria, frequency, hematuria and nocturia.   Musculoskeletal:  Negative for arthralgias, back pain, flank pain, myalgias and neck pain.       +  bilateral breast pain, 7/10 severity  Skin:  Negative for itching and rash.  Neurological:  Positive for headaches. Negative for dizziness and numbness.  Hematological:  Does not bruise/bleed easily.  Psychiatric/Behavioral:   Positive for depression. Negative for sleep disturbance and suicidal ideas. The patient is nervous/anxious.   All other systems reviewed and are negative.    VITALS:   Blood pressure 127/83, pulse 87, temperature 98.3 F (36.8 C), temperature source Oral, resp. rate 19, height 5' 7.5" (1.715 m), weight 186 lb 12.8 oz (84.7 kg), SpO2 98%.  Wt Readings from Last 3 Encounters:  02/02/23 186 lb 12.8 oz (84.7 kg)  01/07/23 184 lb (83.5 kg)  12/28/22 189 lb (85.7 kg)    Body mass index is 28.83 kg/m.  Performance status (ECOG): 1 - Symptomatic but completely ambulatory  PHYSICAL EXAM:   Physical Exam Vitals and nursing note reviewed. Exam conducted with a chaperone present.  Constitutional:      Appearance: Normal appearance.  Cardiovascular:     Rate and Rhythm: Normal rate and regular rhythm.     Pulses: Normal pulses.     Heart sounds: Normal heart sounds.  Pulmonary:     Effort: Pulmonary effort is normal.     Breath sounds: Normal breath sounds.  Chest:     Comments: +Left breast lumpectomy scar in upper outer quadrant is well-healed.  Abdominal:     Palpations: Abdomen is soft. There is no hepatomegaly, splenomegaly or mass.     Tenderness: There is no abdominal tenderness.  Musculoskeletal:     Right lower leg: No edema.     Left lower leg: No edema.  Lymphadenopathy:     Cervical: No cervical adenopathy.     Right cervical: No superficial, deep or posterior cervical adenopathy.    Left cervical: No superficial, deep or posterior cervical adenopathy.     Upper Body:     Right upper body: No supraclavicular or axillary adenopathy.     Left upper body: No supraclavicular or axillary adenopathy.  Neurological:     General: No focal deficit present.     Mental Status: She is alert and oriented to person, place, and time.  Psychiatric:        Mood and Affect: Mood normal.        Behavior: Behavior normal.     LABS:      Latest Ref Rng & Units 11/03/2022    1:23 PM  10/22/2022   10:02 AM 05/07/2022    1:45 PM  CBC  WBC 4.0 - 10.5 K/uL 3.5  3.5  3.3   Hemoglobin 12.0 - 15.0 g/dL 64.3  32.9  51.8   Hematocrit 36.0 - 46.0 % 41.1  39.6  40.5   Platelets 150 - 400 K/uL 146  151  148       Latest Ref Rng & Units 11/03/2022    1:23 PM 10/22/2022   10:02 AM 06/29/2022    2:15 PM  CMP  Glucose 70 - 99 mg/dL 841  96  95   BUN 8 - 23 mg/dL 26  27  24    Creatinine 0.44 - 1.00 mg/dL 6.60  6.30  1.60   Sodium 135 - 145 mmol/L 138  141  142   Potassium 3.5 - 5.1 mmol/L 3.7  4.2  4.3   Chloride 98 - 111 mmol/L 105  105  102   CO2 22 - 32 mmol/L 25  22  23    Calcium 8.9 - 10.3 mg/dL  9.2  9.8  9.4   Total Protein 6.5 - 8.1 g/dL 6.8  6.8  6.3   Total Bilirubin 0.3 - 1.2 mg/dL 0.6  0.7  0.2   Alkaline Phos 38 - 126 U/L 72  95  88   AST 15 - 41 U/L 22  19  16    ALT 0 - 44 U/L 18  14  12       No results found for: "CEA1", "CEA" / No results found for: "CEA1", "CEA" No results found for: "PSA1" No results found for: "JXB147" No results found for: "CAN125"  No results found for: "TOTALPROTELP", "ALBUMINELP", "A1GS", "A2GS", "BETS", "BETA2SER", "GAMS", "MSPIKE", "SPEI" No results found for: "TIBC", "FERRITIN", "IRONPCTSAT" Lab Results  Component Value Date   LDH 155 11/16/2018   LDH 140 04/18/2018   LDH 142 03/16/2018     STUDIES:   No results found.

## 2023-02-02 NOTE — Patient Instructions (Addendum)
[facility]  Discharge Instructions  You were seen and examined today by Dr. Ellin Saba.  Dr. Ellin Saba discussed your most recent surgery results. It was a small 6 mm tumor that was removed.  Dr. Ellin Saba wants you to continue taking your anastrozole as prescribed.  Follow-up as scheduled.    Thank you for choosing [facility] to provide your oncology and hematology care.   To afford each patient quality time with our provider, please arrive at least 15 minutes before your scheduled appointment time. You may need to reschedule your appointment if you arrive late (10 or more minutes). Arriving late affects you and other patients whose appointments are after yours.  Also, if you miss three or more appointments without notifying the office, you may be dismissed from the clinic at the provider's discretion.    Again, thank you for choosing [facility].  Our hope is that these requests will decrease the amount of time that you wait before being seen by our physicians.   If you have a lab appointment with the Cancer Center - please note that after April 8th, all labs will be drawn in the cancer center.  You do not have to check in or register with the main entrance as you have in the past but will complete your check-in at the cancer center.            _____________________________________________________________  Should you have questions after your visit to [facility], please contact our office at [phone] and follow the prompts.  Our office hours are 8:00 a.m. to 4:30 p.m. Monday - Thursday and 8:00 a.m. to 2:30 p.m. Friday.  Please note that voicemails left after 4:00 p.m. may not be returned until the following business day.  We are closed weekends and all major holidays.  You do have access to a nurse 24-7, just call the main number to the clinic [phone] and do not press any options, hold on the  line and a nurse will answer the phone.    For prescription refill requests, have your pharmacy contact our office and allow 72 hours.    Masks are no longer required in the cancer centers. If you would like for your care team to wear a mask while they are taking care of you, please let them know. You may have one support person who is at least 80 years old accompany you for your appointments.

## 2023-02-09 DIAGNOSIS — C50411 Malignant neoplasm of upper-outer quadrant of right female breast: Secondary | ICD-10-CM | POA: Diagnosis not present

## 2023-02-09 DIAGNOSIS — Z170 Estrogen receptor positive status [ER+]: Secondary | ICD-10-CM | POA: Diagnosis not present

## 2023-02-09 DIAGNOSIS — D0512 Intraductal carcinoma in situ of left breast: Secondary | ICD-10-CM | POA: Diagnosis not present

## 2023-02-12 DIAGNOSIS — D0512 Intraductal carcinoma in situ of left breast: Secondary | ICD-10-CM | POA: Diagnosis not present

## 2023-02-12 DIAGNOSIS — C50411 Malignant neoplasm of upper-outer quadrant of right female breast: Secondary | ICD-10-CM | POA: Diagnosis not present

## 2023-02-12 DIAGNOSIS — Z170 Estrogen receptor positive status [ER+]: Secondary | ICD-10-CM | POA: Diagnosis not present

## 2023-02-15 ENCOUNTER — Encounter: Payer: Self-pay | Admitting: Internal Medicine

## 2023-02-15 ENCOUNTER — Ambulatory Visit (INDEPENDENT_AMBULATORY_CARE_PROVIDER_SITE_OTHER): Payer: Medicare Other | Admitting: Internal Medicine

## 2023-02-15 VITALS — BP 103/69 | HR 91 | Ht 67.5 in | Wt 187.8 lb

## 2023-02-15 DIAGNOSIS — E039 Hypothyroidism, unspecified: Secondary | ICD-10-CM

## 2023-02-15 DIAGNOSIS — Z170 Estrogen receptor positive status [ER+]: Secondary | ICD-10-CM | POA: Diagnosis not present

## 2023-02-15 DIAGNOSIS — C50411 Malignant neoplasm of upper-outer quadrant of right female breast: Secondary | ICD-10-CM | POA: Diagnosis not present

## 2023-02-15 DIAGNOSIS — D0512 Intraductal carcinoma in situ of left breast: Secondary | ICD-10-CM

## 2023-02-15 DIAGNOSIS — E782 Mixed hyperlipidemia: Secondary | ICD-10-CM | POA: Diagnosis not present

## 2023-02-15 DIAGNOSIS — I10 Essential (primary) hypertension: Secondary | ICD-10-CM

## 2023-02-15 DIAGNOSIS — F411 Generalized anxiety disorder: Secondary | ICD-10-CM

## 2023-02-15 DIAGNOSIS — F5101 Primary insomnia: Secondary | ICD-10-CM

## 2023-02-15 MED ORDER — ESCITALOPRAM OXALATE 5 MG PO TABS
5.0000 mg | ORAL_TABLET | Freq: Every day | ORAL | 2 refills | Status: DC
Start: 2023-02-15 — End: 2023-06-21

## 2023-02-15 NOTE — Assessment & Plan Note (Signed)
Was on Crestor Her lipid profile is wnl now even without Crestor

## 2023-02-15 NOTE — Patient Instructions (Signed)
Please start taking Escitalopram as prescribed for anxiety.  Please continue to take medications as prescribed.  Please continue to take protein supplement.

## 2023-02-15 NOTE — Progress Notes (Signed)
Established Patient Office Visit  Subjective:  Patient ID: Candace Lewis, female    DOB: Nov 26, 1940  Age: 82 y.o. MRN: [id number]  CC:  Chief Complaint  Patient presents with   Insomnia    Four month follow up    Hypothyroidism    Four month follow up     HPI Candace Lewis is a 82 y.o. female with past medical history of breast ca., tongue ca., HTN, HLD, hypothyroidism and GERD who presents for f/u of her chronic medical conditions.  HTN: BP is well-controlled. Takes medication - metoprolol tartrate 25 mg BID regularly. Patient denies headache, dizziness or dyspnea. She has palpitations, but usually controlled with Metoprolol. She has stopped taking Crestor for HLD. Her lipid profile was wnl in 06/24.   GERD: She takes Omeprazole for GERD. Denies any nausea or vomiting.  She has chronic dysphagia and can only tolerate liquid.  She takes Loss adjuster, chartered protein supplement QID.  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   She has history of hypothyroidism, and takes levothyroxine 88 mcg daily.  Denies any recent change in weight or appetite.   She reports chronic insomnia.  She has spells of anxiety at nighttime.  Denies any anhedonia, SI or HI currently.  She has tried melatonin without much relief.  She was placed on trazodone in the last visit, but did not get any relief with it. She tried Elavil, but felt lack of control due to long sleep hours with it.  Left breast ca. status post partial mastectomy: She had partial mastectomy in 08/24.  She has started radiation therapy today.  She is on anastrozole since her right breast ca. in 2023.    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 MICROCALCIFICATIONS PRESENT.   BREAST BIOPSY Left 11/16/2022   FOCI OF DUCTAL CARCINOMA IN SITU, HIGH GRADE - NECROSIS: PRESENT, PROMINENT - CALCIFICATIONSMM LT BREAST BX W LOC DEV 1ST LESION IMAGE BX SPEC STEREO GUIDE 11/16/2022 [facility]   CHOLECYSTECTOMY  2005   ESOPHAGOGASTRODUODENOSCOPY  06/19/2014   Dr. Teena Dunk: LA Class B esophagitis. PEG tube in place. erythema in proximal esophagus.    ESOPHAGOGASTRODUODENOSCOPY (EGD) WITH PROPOFOL N/A 03/11/2017   Web in upper third of esophagus s/p dilation with scope passage, small hiatal hernia, normal duodenum, no specimens collected   FUDUCIAL PLACEMENT N/A 10/28/2015   Procedure: PLACEMENT OF FUDUCIAL TIMES THREE; LEFT UPPER LOBE BIOPSY;  Surgeon: Delight Ovens, MD;  Location: [facility] OR;  Service: Thoracic;  Laterality: N/A;   MASTECTOMY, PARTIAL Left 1984   no cancer, fibrocystic breast disease   SKIN SURGERY  1960   moles  removed from various areas per patient report   TONSILLECTOMY  1950   per patient report   TUBAL LIGATION  1976   VAGINAL HYSTERECTOMY  1981   vaginal hyst per patient report   VIDEO BRONCHOSCOPY WITH ENDOBRONCHIAL NAVIGATION N/A 10/28/2015   Procedure: VIDEO BRONCHOSCOPY WITH ENDOBRONCHIAL NAVIGATION;  Surgeon: Delight Ovens, MD;  Location: [facility] OR;  Service: Thoracic;  Laterality: N/A;    Family History  Problem Relation Age of Onset   Heart disease Mother     Social History   Socioeconomic History   Marital status: Widowed    Spouse name: Not on file   Number of children: 2    Years of education: Not on file   Highest education level: Not on file  Occupational History   Occupation: retired  Tobacco Use   Smoking status: Former    Current packs/day: 0.00    Average packs/day: 1 pack/day for 23.0 years (23.0 ttl pk-yrs)    Types: Cigarettes    Start date: 05/04/1959    Quit date: 05/03/1982    Years since quitting: 40.8   Smokeless tobacco: Never  Vaping Use   Vaping status: Never Used  Substance and Sexual Activity   Alcohol use: No    Alcohol/week: 0.0 standard drinks of alcohol   Drug use: No   Sexual activity: Never    Birth control/protection: None  Other Topics Concern   Not on file  Social History Narrative   Not on file   Social Determinants of Health   Financial Resource Strain: Low Risk  (06/09/2021)   Received [facility], [facility]   Overall Financial Resource Strain (CARDIA)    Difficulty of Paying Living Expenses: Not hard at all  Food Insecurity: Food Insecurity Present (06/09/2021)   [facility], [facility]   Hunger Vital Sign    Worried About Running Out of Food in the Last Year: Sometimes true    Ran Out of Food in the Last Year: Never true  Transportation Needs: No Transportation Needs (06/09/2021)   Received from [facility], [facility]   [facility] - Transportation    Lack of Transportation (Medical): No    Lack of Transportation (Non-Medical): No  Physical Activity: Not on file  Stress: Not on file  Social Connections: Not on file  Intimate Partner Violence: Not on file    Outpatient Medications Prior to Visit  Medication Sig Dispense Refill   anastrozole (ARIMIDEX) 1 MG tablet Take 1 tablet (1 mg total) by mouth daily. Dissolve in water and drink once daily. 90 tablet 3   Aspirin-Acetaminophen (GOODYS BACK & BODY PAIN) 500-325 MG PACK Take 1 packet by mouth daily as needed (pain).     diclofenac Sodium (VOLTAREN) 1 % GEL Apply 2 g topically 4 (four) times daily. Apply to  painful area around right ribs and right breast. (Patient taking differently: Apply 2 g topically 4 (four) times daily as needed (pain). Apply to painful area around right ribs and right breast.) 50 g 0   levothyroxine (SYNTHROID) 88 MCG tablet TAKE 1 TABLET BY MOUTH BEFORE BREAKFAST 90 tablet 0   metoprolol tartrate (LOPRESSOR) 25 MG tablet Take 1 tablet (25 mg total) by mouth 2 (two) times daily. (Patient taking differently: Take 12.5-25 mg by mouth See admin instructions. 25 mg in the morning, 12.5 mg at bedtime) 180 tablet 3   omeprazole (PRILOSEC OTC) 20 MG tablet Take 20 mg by mouth  daily.     traMADol (ULTRAM) 50 MG tablet Take 1 tablet (50 mg total) by mouth every 6 (six) hours as needed. 15 tablet 0   triamcinolone cream (KENALOG) 0.1 % Apply 1 Application topically 2 (two) times daily. (Patient taking differently: Apply 1 Application topically 2 (two) times daily as needed (irritation/itching).) 30 g 0   trolamine salicylate (ASPERCREME) 10 % cream Apply 1 Application topically as needed for muscle pain.     amitriptyline (ELAVIL) 10 MG tablet Take 1 tablet (10 mg total) by mouth at bedtime. 30 tablet 0   rosuvastatin (CRESTOR) 20 MG tablet Take 20 mg by mouth every evening.     No facility-administered medications prior to visit.    Allergies  Allergen Reactions   Adhesive [Tape] Rash    Reports rash and blistering if any adhesive tape   Latex Rash    Rash and skin blistering per patient report    ROS Review of Systems  Constitutional:  Negative for chills and fever.  HENT:  Negative for congestion, sinus pressure, sinus pain and sore throat.   Eyes:  Negative for pain and discharge.  Respiratory:  Negative for cough and shortness of breath.   Cardiovascular:  Negative for chest pain and palpitations.  Gastrointestinal:  Negative for abdominal pain, diarrhea, nausea and vomiting.  Endocrine: Negative for polydipsia and polyuria.  Genitourinary:  Negative for dysuria and  hematuria.  Musculoskeletal:  Positive for neck pain. Negative for neck stiffness.  Skin:  Negative for rash.  Neurological:  Negative for dizziness and weakness.  Psychiatric/Behavioral:  Positive for sleep disturbance. Negative for agitation and behavioral problems. The patient is nervous/anxious.       Objective:    Physical Exam Vitals reviewed.  Constitutional:      General: She is not in acute distress.    Appearance: She is not diaphoretic.  HENT:     Head: Normocephalic and atraumatic.     Nose: Nose normal.     Mouth/Throat:     Mouth: Mucous membranes are moist.  Eyes:     General: No scleral icterus.    Extraocular Movements: Extraocular movements intact.  Neck:     Comments: Hardening of right submandibular area - chronic Cardiovascular:     Rate and Rhythm: Normal rate and regular rhythm.     Heart sounds: Normal heart sounds. No murmur heard. Pulmonary:     Breath sounds: Normal breath sounds. No wheezing or rales.  Musculoskeletal:     Cervical back: Neck supple. No tenderness.     Right lower leg: No edema.     Left lower leg: No edema.  Skin:    General: Skin is warm.     Findings: No rash.  Neurological:     General: No focal deficit present.     Mental Status: She is alert and oriented to person, place, and time.  Psychiatric:        Mood and Affect: Mood is anxious.        Behavior: Behavior normal.     BP 103/69 (BP Location: Right Arm, Patient Position: Sitting, Cuff Size: Normal)   Pulse 91   Ht 5' 7.5" (1.715 m)   Wt 187 lb 12.8 oz (85.2 kg)   SpO2 97%   BMI 28.98 kg/m  Wt Readings from Last 3 Encounters:  02/15/23 187 lb 12.8 oz (85.2 kg)  02/02/23 186 lb 12.8 oz (84.7 kg)  01/07/23 184 lb (83.5 kg)    Lab Results  Component Value Date   TSH 5.680 (H) 11/03/2022   Lab Results  Component Value Date   WBC 3.5 (L) 11/03/2022   HGB 13.6 11/03/2022   HCT 41.1 11/03/2022   MCV 86.9 11/03/2022   PLT 146 (L) 11/03/2022   Lab  Results  Component Value Date   NA 138 11/03/2022   K 3.7 11/03/2022   CO2 25 11/03/2022   GLUCOSE 117 (H) 11/03/2022   BUN 26 (H) 11/03/2022   CREATININE 0.97 11/03/2022   BILITOT 0.6 11/03/2022   ALKPHOS 72 11/03/2022   AST 22 11/03/2022   ALT 18 11/03/2022   PROT 6.8 11/03/2022   ALBUMIN 3.5 11/03/2022   CALCIUM 9.2 11/03/2022   ANIONGAP 8 11/03/2022   EGFR 63 10/22/2022   Lab Results  Component Value Date   CHOL 153 10/22/2022   Lab Results  Component Value Date   HDL 48 10/22/2022   Lab Results  Component Value Date   LDLCALC 75 10/22/2022   Lab Results  Component Value Date   TRIG 177 (H) 10/22/2022   Lab Results  Component Value Date   CHOLHDL 3.2 10/22/2022   Lab Results  Component Value Date   HGBA1C 5.3 10/22/2022      Assessment & Plan:   Problem List Items Addressed This Visit       Cardiovascular and Mediastinum   Essential hypertension    BP Readings from Last 1 Encounters:  02/15/23 103/69   Well-controlled with Metoprolol 25 mg BID Tried to switch from Lopressor to Toprol, but did not help with palpitations - placed back on Lopressor Counseled for compliance with the medications        Endocrine   Hypothyroidism    Lab Results  Component Value Date   TSH 5.680 (H) 11/03/2022   Likely elevated due to acute stressors -recent surgery, now on radiation treatment On Levothyroxine 88 mcg once daily Will recheck TSH later        Other   Hyperlipidemia    Was on Crestor Her lipid profile is wnl now even without Crestor      Primary insomnia    Likely due to GAD Tried trazodone as needed, but did not help Did not like the effect of Elavil Sleep hygiene material provided      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    Dispense:  30 tablet    Refill:  2    Follow-up: Return in about 4 months (around 06/18/2023) for GAD.    Anabel Halon, MD

## 2023-02-15 NOTE — Assessment & Plan Note (Signed)
S/p left partial mastectomy on 12/30/22 Undergoing radiotherapy  Right breast lumpectomy on 12/15/2021 XRT to the right breast from 02/02/2022 through 02/13/2022 Anastrozole started 01/06/2022 Followed by Oncology

## 2023-02-16 DIAGNOSIS — Z170 Estrogen receptor positive status [ER+]: Secondary | ICD-10-CM | POA: Diagnosis not present

## 2023-02-16 DIAGNOSIS — D0512 Intraductal carcinoma in situ of left breast: Secondary | ICD-10-CM | POA: Diagnosis not present

## 2023-02-16 DIAGNOSIS — C50411 Malignant neoplasm of upper-outer quadrant of right female breast: Secondary | ICD-10-CM | POA: Diagnosis not present

## 2023-02-17 DIAGNOSIS — D0512 Intraductal carcinoma in situ of left breast: Secondary | ICD-10-CM | POA: Diagnosis not present

## 2023-02-17 DIAGNOSIS — Z170 Estrogen receptor positive status [ER+]: Secondary | ICD-10-CM | POA: Diagnosis not present

## 2023-02-17 DIAGNOSIS — C50411 Malignant neoplasm of upper-outer quadrant of right female breast: Secondary | ICD-10-CM | POA: Diagnosis not present

## 2023-02-19 DIAGNOSIS — D0512 Intraductal carcinoma in situ of left breast: Secondary | ICD-10-CM | POA: Diagnosis not present

## 2023-02-19 DIAGNOSIS — C50411 Malignant neoplasm of upper-outer quadrant of right female breast: Secondary | ICD-10-CM | POA: Diagnosis not present

## 2023-02-19 DIAGNOSIS — F411 Generalized anxiety disorder: Secondary | ICD-10-CM | POA: Insufficient documentation

## 2023-02-19 DIAGNOSIS — Z170 Estrogen receptor positive status [ER+]: Secondary | ICD-10-CM | POA: Diagnosis not present

## 2023-02-19 NOTE — Assessment & Plan Note (Addendum)
Lab Results  Component Value Date   TSH 5.680 (H) 11/03/2022   Likely elevated due to acute stressors -recent surgery, now on radiation treatment On Levothyroxine 88 mcg once daily Will recheck TSH later

## 2023-02-19 NOTE — Assessment & Plan Note (Signed)
BP Readings from Last 1 Encounters:  02/15/23 103/69   Well-controlled with Metoprolol 25 mg BID Tried to switch from Lopressor to Toprol, but did not help with palpitations - placed back on Lopressor Counseled for compliance with the medications

## 2023-02-19 NOTE — Assessment & Plan Note (Signed)
Her insomnia is likely due to GAD Has tried trazodone and Elavil for insomnia Started Lexapro 5 mg once daily for GAD

## 2023-02-19 NOTE — Assessment & Plan Note (Signed)
Likely due to GAD Tried trazodone as needed, but did not help Did not like the effect of Elavil Sleep hygiene material provided

## 2023-02-22 DIAGNOSIS — Z170 Estrogen receptor positive status [ER+]: Secondary | ICD-10-CM | POA: Diagnosis not present

## 2023-02-22 DIAGNOSIS — C50411 Malignant neoplasm of upper-outer quadrant of right female breast: Secondary | ICD-10-CM | POA: Diagnosis not present

## 2023-02-22 DIAGNOSIS — D0512 Intraductal carcinoma in situ of left breast: Secondary | ICD-10-CM | POA: Diagnosis not present

## 2023-05-04 ENCOUNTER — Other Ambulatory Visit: Payer: Self-pay | Admitting: Internal Medicine

## 2023-06-21 ENCOUNTER — Encounter: Payer: Self-pay | Admitting: Internal Medicine

## 2023-06-21 ENCOUNTER — Ambulatory Visit (INDEPENDENT_AMBULATORY_CARE_PROVIDER_SITE_OTHER): Payer: Medicare Other | Admitting: Internal Medicine

## 2023-06-21 VITALS — BP 129/75 | HR 100 | Wt 183.0 lb

## 2023-06-21 DIAGNOSIS — M792 Neuralgia and neuritis, unspecified: Secondary | ICD-10-CM | POA: Diagnosis not present

## 2023-06-21 DIAGNOSIS — R221 Localized swelling, mass and lump, neck: Secondary | ICD-10-CM

## 2023-06-21 DIAGNOSIS — D0512 Intraductal carcinoma in situ of left breast: Secondary | ICD-10-CM | POA: Diagnosis not present

## 2023-06-21 DIAGNOSIS — F411 Generalized anxiety disorder: Secondary | ICD-10-CM

## 2023-06-21 DIAGNOSIS — E039 Hypothyroidism, unspecified: Secondary | ICD-10-CM | POA: Diagnosis not present

## 2023-06-21 DIAGNOSIS — I10 Essential (primary) hypertension: Secondary | ICD-10-CM | POA: Diagnosis not present

## 2023-06-21 DIAGNOSIS — J0100 Acute maxillary sinusitis, unspecified: Secondary | ICD-10-CM | POA: Diagnosis not present

## 2023-06-21 MED ORDER — GABAPENTIN 100 MG PO CAPS
100.0000 mg | ORAL_CAPSULE | Freq: Every day | ORAL | 2 refills | Status: AC
Start: 2023-06-21 — End: ?

## 2023-06-21 MED ORDER — AMOXICILLIN-POT CLAVULANATE 875-125 MG PO TABS
1.0000 | ORAL_TABLET | Freq: Two times a day (BID) | ORAL | 0 refills | Status: AC
Start: 2023-06-21 — End: ?

## 2023-06-21 NOTE — Assessment & Plan Note (Signed)
 S/p left partial mastectomy on 12/30/22 S/p radiotherapy  Right breast lumpectomy on 12/15/2021 XRT to the right breast from 02/02/2022 through 02/13/2022 Anastrozole started 01/06/2022 Followed by Oncology

## 2023-06-21 NOTE — Progress Notes (Signed)
 Established Patient Office Visit  Subjective:  Patient ID: Candace Lewis, female    DOB: 22-Dec-1940  Age: 83 y.o. MRN: [id number]  CC:  Chief Complaint  Patient presents with   Care Management    4 month f/u , reports sinus pressure , pain on her right neck, reports trouble sleeping.     HPI Candace Lewis is a 83 y.o. female with past medical history of breast ca., tongue ca., HTN, HLD, hypothyroidism and GERD who presents for f/u of her chronic medical conditions.  HTN: BP is well-controlled. Takes medication - metoprolol tartrate 25 mg BID regularly. Patient denies headache, dizziness or dyspnea. She has palpitations, but usually controlled with Metoprolol. She has stopped taking Crestor for HLD. Her lipid profile was wnl in 06/24.   GERD: She takes Omeprazole for GERD. Denies any nausea or vomiting.  She has chronic dysphagia and can only tolerate liquid.  She takes Loss adjuster, chartered protein supplement QID.  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   She has history of hypothyroidism, and takes levothyroxine 88 mcg daily.  Denies any recent change in weight or appetite.  She has right sided neck mass behind TMJ area, which has been painful upon chewing and movement (flexion of neck).  [place] of neck was unremarkable in 01/24.   She reports chronic insomnia.  She has spells of anxiety at nighttime.  Denies any anhedonia, SI or HI currently.  She has tried melatonin without much relief.  She did not like the effect of trazodone.  She tried Elavil, but felt lack of control due to long sleep hours with it.  She was given Lexapro for GAD in the last visit, but felt numb (no feeling) with it, and has stopped it.  Left breast ca. status post partial mastectomy: She had partial mastectomy in 08/24.  She has completed radiation therapy.  She is on anastrozole since her right breast ca. in 2023.  She reports pins and needlelike sensation in the  breast area since her treatment.  She has been taking Goody powder for it without much relief.  She reports nasal congestion, right-sided sinus pressure related headache and facial pain and postnasal drip for the last 1 week.  She has bought nasal saline spray and Neti pot, but has not used it yet.  Denies any fever or chills currently.   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 MICROCALCIFICATIONS PRESENT.   BREAST BIOPSY Left 11/16/2022   FOCI OF DUCTAL CARCINOMA IN SITU, HIGH GRADE - NECROSIS: PRESENT, PROMINENT - CALCIFICATIONSMM LT BREAST BX W LOC DEV 1ST LESION IMAGE BX SPEC STEREO GUIDE 11/16/2022 [facility]  MAMMOGRAPHY   CHOLECYSTECTOMY  2005   ESOPHAGOGASTRODUODENOSCOPY  06/19/2014   Dr. Teena Dunk: LA Class B esophagitis. PEG tube in place. erythema in proximal esophagus.    ESOPHAGOGASTRODUODENOSCOPY (EGD) WITH PROPOFOL N/A 03/11/2017   Web in upper third of esophagus s/p dilation with scope passage, small hiatal hernia, normal duodenum, no specimens collected   FUDUCIAL PLACEMENT N/A 10/28/2015   Procedure: PLACEMENT OF FUDUCIAL TIMES THREE; LEFT UPPER LOBE BIOPSY;  Surgeon: Delight Ovens, MD;  Location: [facility] OR;  Service: Thoracic;  Laterality: N/A;   MASTECTOMY, PARTIAL Left 1984   no cancer, fibrocystic breast  disease   SKIN SURGERY  1960   moles removed from various areas per patient report   TONSILLECTOMY  1950   per patient report   TUBAL LIGATION  1976   VAGINAL HYSTERECTOMY  1981   vaginal hyst per patient report   VIDEO BRONCHOSCOPY WITH ENDOBRONCHIAL NAVIGATION N/A 10/28/2015   Procedure: VIDEO BRONCHOSCOPY WITH ENDOBRONCHIAL NAVIGATION;  Surgeon: Delight Ovens, MD;  Location: [facility] OR;  Service: Thoracic;  Laterality: N/A;    Family History  Problem Relation Age of Onset   Heart disease Mother     Social History   Socioeconomic History   Marital status: Widowed    Spouse name: Not on file   Number of children: 2   Years of education: Not on file   Highest education level: Not on file  Occupational History   Occupation: retired  Tobacco Use   Smoking status: Former    Current packs/day: 0.00    Average packs/day: 1 pack/day for 23.0 years (23.0 ttl pk-yrs)    Types: Cigarettes    Start date: 05/04/1959    Quit date: 05/03/1982    Years since quitting: 41.1   Smokeless tobacco: Never  Vaping Use   Vaping status: Never Used  Substance and Sexual Activity   Alcohol use: No    Alcohol/week: 0.0 standard drinks of alcohol   Drug use: No   Sexual activity: Never    Birth control/protection: None  Other Topics Concern   Not on file  Social History Narrative   Not on file   Social Drivers of Health   Financial Resource Strain: Low Risk  (06/09/2021)   Received from [facility], [facility]   Overall Financial Resource Strain (CARDIA)    Difficulty of Paying Living Expenses: Not hard at all  Food Insecurity: Food Insecurity Present (06/09/2021)   Received from [facility], [facility]   Hunger Vital Sign    Worried About Running Out of Food in the Last Year: Sometimes true    Ran Out of Food in the Last Year: Never true  Transportation Needs: No Transportation Needs (06/09/2021)   Received from [facility], [facility]   [facility] -  Transportation    Lack of Transportation (Medical): No    Lack of Transportation (Non-Medical): No  Physical Activity: Not on file  Stress: Not on file  Social Connections: Not on file  Intimate Partner Violence: Not on file    Outpatient Medications Prior to Visit  Medication Sig Dispense Refill   anastrozole (ARIMIDEX) 1 MG tablet Take 1 tablet (1 mg total) by mouth daily. Dissolve in water and drink once daily. 90 tablet 3   Aspirin-Acetaminophen (GOODYS BACK & BODY PAIN) 500-325 MG PACK Take 1 packet by mouth daily as needed (pain).     diclofenac Sodium (VOLTAREN) 1 % GEL Apply 2 g topically 4 (  four) times daily. Apply to painful area around right ribs and right breast. (Patient taking differently: Apply 2 g topically 4 (four) times daily as needed (pain). Apply to painful area around right ribs and right breast.) 50 g 0   levothyroxine (SYNTHROID) 88 MCG tablet TAKE 1 TABLET BY MOUTH BEFORE BREAKFAST 90 tablet 0   metoprolol tartrate (LOPRESSOR) 25 MG tablet Take 1 tablet by mouth twice daily 180 tablet 2   omeprazole (PRILOSEC OTC) 20 MG tablet Take 20 mg by mouth daily.     traMADol (ULTRAM) 50 MG tablet Take 1 tablet (50 mg total) by mouth every 6 (six) hours as needed. 15 tablet 0   triamcinolone cream (KENALOG) 0.1 % Apply 1 Application topically 2 (two) times daily. (Patient taking differently: Apply 1 Application topically 2 (two) times daily as needed (irritation/itching).) 30 g 0   trolamine salicylate (ASPERCREME) 10 % cream Apply 1 Application topically as needed for muscle pain.     escitalopram (LEXAPRO) 5 MG tablet Take 1 tablet (5 mg total) by mouth daily. 30 tablet 2   No facility-administered medications prior to visit.    Allergies  Allergen Reactions   Adhesive [Tape] Rash    Reports rash and blistering if any adhesive tape   Latex Rash    Rash and skin blistering per patient report    ROS Review of Systems  Constitutional:  Negative for chills and fever.   HENT:  Positive for congestion, postnasal drip, sinus pressure and sinus pain. Negative for sore throat.   Eyes:  Negative for pain and discharge.  Respiratory:  Negative for cough and shortness of breath.   Cardiovascular:  Negative for chest pain and palpitations.  Gastrointestinal:  Negative for abdominal pain, diarrhea, nausea and vomiting.  Endocrine: Negative for polydipsia and polyuria.  Genitourinary:  Negative for dysuria and hematuria.  Musculoskeletal:  Positive for neck pain. Negative for neck stiffness.  Skin:  Negative for rash.  Neurological:  Positive for headaches. Negative for dizziness and weakness.  Psychiatric/Behavioral:  Positive for sleep disturbance. Negative for agitation and behavioral problems. The patient is nervous/anxious.       Objective:    Physical Exam Vitals reviewed.  Constitutional:      General: She is not in acute distress.    Appearance: She is not diaphoretic.  HENT:     Head: Normocephalic and atraumatic.     Nose:     Right Sinus: Maxillary sinus tenderness and frontal sinus tenderness present.     Mouth/Throat:     Mouth: Mucous membranes are moist.  Eyes:     General: No scleral icterus.    Extraocular Movements: Extraocular movements intact.  Neck:     Comments: Hardening of right submandibular area - chronic Cardiovascular:     Rate and Rhythm: Normal rate and regular rhythm.     Heart sounds: Normal heart sounds. No murmur heard. Pulmonary:     Breath sounds: Normal breath sounds. No wheezing or rales.  Musculoskeletal:     Cervical back: Neck supple. No tenderness.     Right lower leg: No edema.     Left lower leg: No edema.  Skin:    General: Skin is warm.     Findings: No rash.  Neurological:     General: No focal deficit present.     Mental Status: She is alert and oriented to person, place, and time.  Psychiatric:        Mood and Affect: Mood is  anxious.        Behavior: Behavior normal.     BP 129/75    Pulse 100   Wt 183 lb (83 kg)   SpO2 98%   BMI 28.24 kg/m  Wt Readings from Last 3 Encounters:  06/21/23 183 lb (83 kg)  02/15/23 187 lb 12.8 oz (85.2 kg)  02/02/23 186 lb 12.8 oz (84.7 kg)    Lab Results  Component Value Date   TSH 5.680 (H) 11/03/2022   Lab Results  Component Value Date   WBC 3.5 (L) 11/03/2022   HGB 13.6 11/03/2022   HCT 41.1 11/03/2022   MCV 86.9 11/03/2022   PLT 146 (L) 11/03/2022   Lab Results  Component Value Date   NA 138 11/03/2022   K 3.7 11/03/2022   CO2 25 11/03/2022   GLUCOSE 117 (H) 11/03/2022   BUN 26 (H) 11/03/2022   CREATININE 0.97 11/03/2022   BILITOT 0.6 11/03/2022   ALKPHOS 72 11/03/2022   AST 22 11/03/2022   ALT 18 11/03/2022   PROT 6.8 11/03/2022   ALBUMIN 3.5 11/03/2022   CALCIUM 9.2 11/03/2022   ANIONGAP 8 11/03/2022   EGFR 63 10/22/2022   Lab Results  Component Value Date   CHOL 153 10/22/2022   Lab Results  Component Value Date   HDL 48 10/22/2022   Lab Results  Component Value Date   LDLCALC 75 10/22/2022   Lab Results  Component Value Date   TRIG 177 (H) 10/22/2022   Lab Results  Component Value Date   CHOLHDL 3.2 10/22/2022   Lab Results  Component Value Date   HGBA1C 5.3 10/22/2022      Assessment & Plan:   Problem List Items Addressed This Visit       Cardiovascular and Mediastinum   Essential hypertension - Primary   BP Readings from Last 1 Encounters:  06/21/23 129/75   Well-controlled with Metoprolol 25 mg BID Tried to switch from Lopressor to Toprol, but did not help with palpitations - placed back on Lopressor Counseled for compliance with the medications        Respiratory   Acute non-recurrent maxillary sinusitis   Considering persistent symptoms for the last 1 week, started empiric Augmentin Nasal saline spray as needed for nasal congestion Can perform nasal saline lavage if tolerated      Relevant Medications   amoxicillin-clavulanate (AUGMENTIN) 875-125 MG tablet      Endocrine   Hypothyroidism   Lab Results  Component Value Date   TSH 5.680 (H) 11/03/2022   Was likely elevated due to acute stressors -recent surgery and radiation treatment On Levothyroxine 88 mcg once daily Will recheck TSH and free T4      Relevant Orders   TSH + free T4     Other   Neck mass   Has neck mass, likely thought to be hardening of right submandibular area [place] of neck was negative for discrete mass in 01/24, but due to persistent symptoms/pain - will obtain CT of neck per radiology recommendation      Relevant Orders   CT Soft Tissue Neck W Contrast   Ductal carcinoma in situ (DCIS) of left breast   S/p left partial mastectomy on 12/30/22 S/p radiotherapy  Right breast lumpectomy on 12/15/2021 XRT to the right breast from 02/02/2022 through 02/13/2022 Anastrozole started 01/06/2022 Followed by Oncology      Relevant Medications   amoxicillin-clavulanate (AUGMENTIN) 875-125 MG tablet   GAD (generalized anxiety disorder)   Her insomnia  is likely due to GAD Has tried trazodone and Elavil for insomnia Had started Lexapro 5 mg once daily for GAD, but felt lack of feelings Prefers to avoid any medicine for now for GAD      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    Dispense:  14 tablet    Refill:  0   gabapentin (NEURONTIN) 100 MG capsule    Sig: Take 1 capsule (100 mg total) by mouth at bedtime.    Dispense:  30 capsule    Refill:  2    Follow-up: Return in about 4 months (around 10/19/2023) for Annual physical (after 10/13/23).    Anabel Halon, MD

## 2023-06-21 NOTE — Assessment & Plan Note (Addendum)
 Her insomnia is likely due to GAD Has tried trazodone and Elavil for insomnia Had started Lexapro 5 mg once daily for GAD, but felt lack of feelings Prefers to avoid any medicine for now for GAD

## 2023-06-21 NOTE — Assessment & Plan Note (Signed)
 Considering persistent symptoms for the last 1 week, started empiric Augmentin Nasal saline spray as needed for nasal congestion Can perform nasal saline lavage if tolerated

## 2023-06-21 NOTE — Assessment & Plan Note (Signed)
 Likely due to post-lobectomy changes versus radiation Takes Goody powder, would avoid excessive NSAIDs Started gabapentin 100 mg nightly

## 2023-06-21 NOTE — Patient Instructions (Addendum)
 Please start taking Augmentin as prescribed.  Please use nasal saline spray and use saline savage as tolerated for sinusitis.  Please take Gabapentin at bedtime for breast area neuropathic pain. If you have dizziness or drowsiness, please stop taking it.  Please continue to take medications as prescribed.  Please continue to follow low salt diet and ambulate as tolerated.

## 2023-06-21 NOTE — Assessment & Plan Note (Signed)
 BP Readings from Last 1 Encounters:  06/21/23 129/75   Well-controlled with Metoprolol 25 mg BID Tried to switch from Lopressor to Toprol, but did not help with palpitations - placed back on Lopressor Counseled for compliance with the medications

## 2023-06-21 NOTE — Assessment & Plan Note (Signed)
 Has neck mass, likely thought to be hardening of right submandibular area [place] of neck was negative for discrete mass in 01/24, but due to persistent symptoms/pain - will obtain CT of neck per radiology recommendation

## 2023-06-21 NOTE — Assessment & Plan Note (Signed)
 Lab Results  Component Value Date   TSH 5.680 (H) 11/03/2022   Was likely elevated due to acute stressors -recent surgery and radiation treatment On Levothyroxine 88 mcg once daily Will recheck TSH and free T4

## 2023-06-22 LAB — TSH+FREE T4
Free T4: 1.23 ng/dL (ref 0.82–1.77)
TSH: 5.09 u[IU]/mL — ABNORMAL HIGH (ref 0.450–4.500)

## 2023-06-22 MED ORDER — LEVOTHYROXINE SODIUM 100 MCG PO TABS
100.0000 ug | ORAL_TABLET | Freq: Every day | ORAL | 1 refills | Status: DC
Start: 2023-06-22 — End: 2023-12-14

## 2023-06-22 NOTE — Addendum Note (Signed)
 Addended byTrena Platt on: 06/22/2023 07:53 AM   Modules accepted: Orders

## 2023-07-23 ENCOUNTER — Other Ambulatory Visit: Payer: Self-pay | Admitting: Hematology

## 2023-07-27 ENCOUNTER — Ambulatory Visit (INDEPENDENT_AMBULATORY_CARE_PROVIDER_SITE_OTHER): Payer: Medicare Other

## 2023-07-27 VITALS — BP 121/67 | Ht 67.5 in | Wt 183.0 lb

## 2023-07-27 DIAGNOSIS — Z0000 Encounter for general adult medical examination without abnormal findings: Secondary | ICD-10-CM | POA: Diagnosis not present

## 2023-07-27 DIAGNOSIS — Z2821 Immunization not carried out because of patient refusal: Secondary | ICD-10-CM

## 2023-07-27 NOTE — Progress Notes (Signed)
 Because this visit was a virtual/telehealth visit,  certain criteria was not obtained, such a blood pressure, CBG if applicable, and timed get up and go. Any medications not marked as "taking" were not mentioned during the medication reconciliation part of the visit. Any vitals not documented were not able to be obtained due to this being a telehealth visit or patient was unable to self-report a recent blood pressure reading due to a lack of equipment at home via telehealth. Vitals that have been documented are verbally provided by the patient.   Subjective:   Candace Lewis is a 83 y.o. who presents for a Medicare Wellness preventive visit.  Visit Complete: Virtual I connected with  Candace Lewis on 07/27/23 by a audio enabled telemedicine application and verified that I am speaking with the correct person using two identifiers.  Patient Location: Home  Provider Location: Home Office  I discussed the limitations of evaluation and management by telemedicine. The patient expressed understanding and agreed to proceed.  Vital Signs: Because this visit was a virtual/telehealth visit, some criteria may be missing or patient reported. Any vitals not documented were not able to be obtained and vitals that have been documented are patient reported.  VideoDeclined- This patient declined Librarian, academic. Therefore the visit was completed with audio only.  Persons Participating in Visit: Patient.  AWV Questionnaire: No: Patient Medicare AWV questionnaire was not completed prior to this visit.  Cardiac Risk Factors include: advanced age (>73men, >30 women);dyslipidemia;hypertension     Objective:    Today's Vitals   07/27/23 0821 07/27/23 0822  BP: 121/67   Weight: 183 lb (83 kg)   Height: 5' 7.5" (1.715 m)   PainSc:  7    Body mass index is 28.24 kg/m.     07/27/2023    8:20 AM 02/02/2023    2:29 PM 12/28/2022   11:24 AM 11/26/2022   10:54 AM  09/23/2022   10:24 AM 05/14/2022    3:22 PM 01/06/2022    2:43 PM  Advanced Directives  Does Patient Have a Medical Advance Directive? Yes No No No No No Yes  Type of Estate agent of [place];Living will      Healthcare Power of [place];Living will  Does patient want to make changes to medical advance directive? No - Patient declined      No - Patient declined  Copy of Healthcare Power of Attorney in Chart? No - copy requested      No - copy requested  Would patient like information on creating a medical advance directive?  No - Patient declined No - Patient declined No - Patient declined No - Patient declined No - Patient declined     Current Medications (verified) Outpatient Encounter Medications as of 07/27/2023  Medication Sig   anastrozole (ARIMIDEX) 1 MG tablet TAKE 1 TABLET BY MOUTH ONCE DAILY .  DISSOLVE  IN  WATER  AND  DRINK  ONCE  DAILY   Aspirin-Acetaminophen (GOODYS BACK & BODY PAIN) 500-325 MG PACK Take 1 packet by mouth daily as needed (pain).   diclofenac Sodium (VOLTAREN) 1 % GEL Apply 2 g topically 4 (four) times daily. Apply to painful area around right ribs and right breast. (Patient taking differently: Apply 2 g topically 4 (four) times daily as needed (pain). Apply to painful area around right ribs and right breast.)   gabapentin (NEURONTIN) 100 MG capsule Take 1 capsule (100 mg total) by mouth at bedtime.   levothyroxine (  SYNTHROID) 100 MCG tablet Take 1 tablet (100 mcg total) by mouth daily before breakfast.   metoprolol tartrate (LOPRESSOR) 25 MG tablet Take 1 tablet by mouth twice daily   omeprazole (PRILOSEC OTC) 20 MG tablet Take 20 mg by mouth daily.   triamcinolone cream (KENALOG) 0.1 % Apply 1 Application topically 2 (two) times daily. (Patient taking differently: Apply 1 Application topically 2 (two) times daily as needed (irritation/itching).)   trolamine salicylate (ASPERCREME) 10 % cream Apply 1 Application topically as needed for muscle  pain.   amoxicillin-clavulanate (AUGMENTIN) 875-125 MG tablet Take 1 tablet by mouth 2 (two) times daily. (Patient not taking: Reported on 07/27/2023)   traMADol (ULTRAM) 50 MG tablet Take 1 tablet (50 mg total) by mouth every 6 (six) hours as needed. (Patient not taking: Reported on 07/27/2023)   No facility-administered encounter medications on file as of 07/27/2023.    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 MICROCALCIFICATIONS PRESENT.   BREAST BIOPSY Left 11/16/2022   FOCI OF DUCTAL CARCINOMA IN SITU, HIGH GRADE - NECROSIS: PRESENT, PROMINENT - CALCIFICATIONSMM LT BREAST BX W LOC DEV 1ST LESION IMAGE BX SPEC STEREO GUIDE 11/16/2022 [facility]   CHOLECYSTECTOMY  2005   ESOPHAGOGASTRODUODENOSCOPY  06/19/2014   Dr. Teena Dunk: LA Class B esophagitis. PEG tube in place. erythema in proximal esophagus.    ESOPHAGOGASTRODUODENOSCOPY (EGD) WITH PROPOFOL N/A 03/11/2017   Web in upper third of esophagus s/p dilation with scope passage, small hiatal hernia, normal duodenum, no specimens collected   FUDUCIAL PLACEMENT N/A 10/28/2015   Procedure: PLACEMENT OF FUDUCIAL TIMES THREE; LEFT UPPER LOBE BIOPSY;  Surgeon: Delight Ovens, MD;  Location: [facility] OR;  Service:  Thoracic;  Laterality: N/A;   MASTECTOMY, PARTIAL Left 1984   no cancer, fibrocystic breast disease   SKIN SURGERY  1960   moles removed from various areas per patient report   TONSILLECTOMY  1950   per patient report   TUBAL LIGATION  1976   VAGINAL HYSTERECTOMY  1981   vaginal hyst per patient report   VIDEO BRONCHOSCOPY WITH ENDOBRONCHIAL NAVIGATION N/A 10/28/2015   Procedure: VIDEO BRONCHOSCOPY WITH ENDOBRONCHIAL NAVIGATION;  Surgeon: Delight Ovens, MD;  Location: [facility] OR;  Service: Thoracic;  Laterality: N/A;   Family History  Problem Relation Age of Onset   Heart disease Mother    Social History   Socioeconomic History   Marital status: Widowed    Spouse name: Not on file   Number of children: 2   Years of education: Not on file   Highest education level: Not on file  Occupational History   Occupation: retired  Tobacco Use  Smoking status: Former    Current packs/day: 0.00    Average packs/day: 1 pack/day for 23.0 years (23.0 ttl pk-yrs)    Types: Cigarettes    Start date: 05/04/1959    Quit date: 05/03/1982    Years since quitting: 41.2   Smokeless tobacco: Never  Vaping Use   Vaping status: Never Used  Substance and Sexual Activity   Alcohol use: No    Alcohol/week: 0.0 standard drinks of alcohol   Drug use: No   Sexual activity: Never    Birth control/protection: None  Other Topics Concern   Not on file  Social History Narrative   Not on file   Social Drivers of Health   Financial Resource Strain: Low Risk  (07/27/2023)   Overall Financial Resource Strain (CARDIA)    Difficulty of Paying Living Expenses: Not hard at all  Food Insecurity: No Food Insecurity (07/27/2023)   Hunger Vital Sign    Worried About Running Out of Food in the Last Year: Never true    Ran Out of Food in the Last Year: Never true  Transportation Needs: No Transportation Needs (07/27/2023)   PRAPARE - Administrator, Civil Service (Medical): No    Lack of  Transportation (Non-Medical): No  Physical Activity: Insufficiently Active (07/27/2023)   Exercise Vital Sign    Days of Exercise per Week: 1 day    Minutes of Exercise per Session: 10 min  Stress: No Stress Concern Present (07/27/2023)   [employer] of Occupational Health - Occupational Stress Questionnaire    Feeling of Stress : Not at all  Social Connections: Socially Isolated (07/27/2023)   Social Connection and Isolation Panel [NHANES]    Frequency of Communication with Friends and Family: More than three times a week    Frequency of Social Gatherings with Friends and Family: Twice a week    Attends Religious Services: Never    Database administrator or Organizations: No    Attends Banker Meetings: Never    Marital Status: Widowed    Tobacco Counseling Counseling given: Not Answered    Clinical Intake:  Pre-visit preparation completed: Yes  Pain : 0-10 Pain Score: 7  Pain Type: Chronic pain Pain Location: Breast Pain Orientation: Right, Left Pain Descriptors / Indicators: Constant, Aching Pain Onset: Today Pain Frequency: Constant Effect of Pain on Daily Activities: from surgery     BMI - recorded: 28.24 Nutritional Status: BMI 25 -29 Overweight Nutritional Risks: None Diabetes: No  Lab Results  Component Value Date   HGBA1C 5.3 10/22/2022     How often do you need to have someone help you when you read instructions, pamphlets, or other written materials from your doctor or pharmacy?: 1 - Never  Interpreter Needed?: No  Information entered by :: Jarah Pember,cma   Activities of Daily Living     07/27/2023    8:29 AM 12/28/2022   11:27 AM  In your present state of health, do you have any difficulty performing the following activities:  Hearing? 1   Comment yes has hearing aids   Vision? 0   Difficulty concentrating or making decisions? 0   Walking or climbing stairs? 0   Dressing or bathing? 0   Doing errands, shopping? 0 0   Preparing Food and eating ? N   Using the Toilet? N   In the past six months, have you accidently leaked urine? N   Do you have problems with loss of bowel control? N  Managing your Medications? N   Managing your Finances? N   Housekeeping or managing your Housekeeping? N     Patient Care Team: Anabel Halon, MD as PCP - General (Internal Medicine) Marjo Bicker, MD as PCP - Cardiology (Cardiology) Jena Gauss Gerrit Friends, MD as Consulting Physician (Gastroenterology) Doreatha Massed, MD as Medical Oncologist (Medical Oncology) Therese Sarah, RN as Oncology Nurse Navigator (Medical Oncology)  Indicate any recent Medical Services you may have received from other than [facility] providers in the past year (date may be approximate).     Assessment:   This is a routine wellness examination for [facility].  Hearing/Vision screen Hearing Screening - Comments:: Patient wear hearing aids  Vision Screening - Comments:: Patient wears glasses   Goals Addressed               This Visit's Progress     Patient Stated (pt-stated)        Patient wants to see grandchildren        Depression Screen     07/27/2023    8:31 AM 06/21/2023    2:18 PM 02/15/2023    1:45 PM 10/13/2022    3:32 PM 07/21/2022    1:11 PM 06/29/2022    1:15 PM 05/28/2022    1:35 PM  PHQ 2/9 Scores  PHQ - 2 Score 2 0 0 1 0 1 2  PHQ- 9 Score 4 0  5  4 6     Fall Risk     07/27/2023    8:26 AM 06/21/2023    2:18 PM 02/15/2023    1:45 PM 12/08/2022    8:41 AM 10/13/2022    3:32 PM  Fall Risk   Falls in the past year? 0 0 0 0 0  Number falls in past yr: 0 0 0  0  Injury with Fall? 0 0 0  0  Risk for fall due to : No Fall Risks No Fall Risks     Follow up Falls evaluation completed Falls evaluation completed  Falls evaluation completed     MEDICARE RISK AT HOME:  Medicare Risk at Home Any stairs in or around the home?: No If so, are there any without handrails?: No Home free of loose throw rugs in  walkways, pet beds, electrical cords, etc?: Yes Adequate lighting in your home to reduce risk of falls?: Yes Life alert?: No Use of a cane, walker or w/c?: No Grab bars in the bathroom?: Yes Shower chair or bench in shower?: Yes (shower chair) Elevated toilet seat or a handicapped toilet?: Yes  TIMED UP AND GO:  Was the test performed?  No  Cognitive Function: 6CIT completed        07/27/2023    8:24 AM 07/21/2022    1:13 PM  6CIT Screen  What Year? 0 points 0 points  What month? 0 points 0 points  What time? 0 points 0 points  Count back from 20 0 points 0 points  Months in reverse 0 points 0 points  Repeat phrase 0 points 0 points  Total Score 0 points 0 points    Immunizations Immunization History  Administered Date(s) Administered   Fluad Quad(high Dose 65+) 01/17/2020, 02/08/2023   Influenza Split 01/22/2014   Influenza, High Dose Seasonal PF 01/28/2018   Influenza-Unspecified 01/30/2022   Moderna Sars-Covid-2 Vaccination 06/15/2019, 07/14/2019, 03/08/2020, 01/28/2021, 02/05/2023   PFIZER SARS-COV-2 Pediatric Vaccination 5-58yrs 03/21/2020   Pneumococcal Conjugate-13 04/16/2016   Pneumococcal Polysaccharide-23 01/08/2021   Respiratory Syncytial Virus  Vaccine,Recomb Aduvanted(Arexvy) 02/22/2023   Tdap 01/02/2005, 08/27/2017   Zoster Recombinant(Shingrix) 03/14/2019, 05/10/2019    Screening Tests Health Maintenance  Topic Date Due   COVID-19 Vaccine (6 - 2024-25 season) 04/02/2023   Medicare Annual Wellness (AWV)  07/26/2024   DTaP/Tdap/Td (3 - Td or Tdap) 08/28/2027   Pneumonia Vaccine 19+ Years old  Completed   INFLUENZA VACCINE  Completed   DEXA SCAN  Completed   Zoster Vaccines- Shingrix  Completed   HPV VACCINES  Aged Out   Hepatitis C Screening  Discontinued    Health Maintenance  Health Maintenance Due  Topic Date Due   COVID-19 Vaccine (6 - 2024-25 season) 04/02/2023   Health Maintenance Items Addressed: Patient declined another covid vaccine    Additional Screening:  Vision Screening: Recommended annual ophthalmology exams for early detection of glaucoma and other disorders of the eye.  Dental Screening: Recommended annual dental exams for proper oral hygiene  Community Resource Referral / Chronic Care Management: CRR required this visit?  No   CCM required this visit?  No     Plan:     I have personally reviewed and noted the following in the patient's chart:   Medical and social history Use of alcohol, tobacco or illicit drugs  Current medications and supplements including opioid prescriptions. Patient is not currently taking opioid prescriptions. Functional ability and status Nutritional status Physical activity Advanced directives List of other physicians Hospitalizations, surgeries, and ER visits in previous 12 months Vitals Screenings to include cognitive, depression, and falls Referrals and appointments  In addition, I have reviewed and discussed with patient certain preventive protocols, quality metrics, and best practice recommendations. A written personalized care plan for preventive services as well as general preventive health recommendations were provided to patient.     Rudi Heap, [place]   07/27/2023   After Visit Summary: (MyChart) Due to this being a telephonic visit, the after visit summary with patients personalized plan was offered to patient via MyChart   Notes: Nothing significant to report at this time.

## 2023-07-27 NOTE — Patient Instructions (Signed)
 Candace Lewis , Thank you for taking time to come for your Medicare Wellness Visit. I appreciate your ongoing commitment to your health goals. Please review the following plan we discussed and let me know if I can assist you in the future.   Referrals/Orders/Follow-Ups/Clinician Recommendations: Follow up in 1 year for next medicare annual wellness.  This is a list of the screening recommended for you and due dates:  Health Maintenance  Topic Date Due   COVID-19 Vaccine (6 - 2024-25 season) 04/02/2023   Medicare Annual Wellness Visit  07/26/2024   DTaP/Tdap/Td vaccine (3 - Td or Tdap) 08/28/2027   Pneumonia Vaccine  Completed   Flu Shot  Completed   DEXA scan (bone density measurement)  Completed   Zoster (Shingles) Vaccine  Completed   HPV Vaccine  Aged Out   Hepatitis C Screening  Discontinued    Advanced directives: (Copy Requested) Please bring a copy of your health care power of attorney and living will to the office to be added to your chart at your convenience. You can mail to [facility] [address] [place], [place] [address] or email to [email or web address]  Next Medicare Annual Wellness Visit scheduled for next year: Yes

## 2023-07-28 ENCOUNTER — Ambulatory Visit (HOSPITAL_COMMUNITY)
Admission: RE | Admit: 2023-07-28 | Discharge: 2023-07-28 | Disposition: A | Discharge status: Home / Self Care | Payer: Medicare Other | Source: Ambulatory Visit | Attending: Internal Medicine | Admitting: Internal Medicine

## 2023-07-28 DIAGNOSIS — I6523 Occlusion and stenosis of bilateral carotid arteries: Secondary | ICD-10-CM | POA: Diagnosis not present

## 2023-07-28 DIAGNOSIS — R221 Localized swelling, mass and lump, neck: Secondary | ICD-10-CM | POA: Diagnosis not present

## 2023-07-28 DIAGNOSIS — M47812 Spondylosis without myelopathy or radiculopathy, cervical region: Secondary | ICD-10-CM | POA: Diagnosis not present

## 2023-07-28 MED ORDER — IOHEXOL 300 MG/ML  SOLN
75.0000 mL | Freq: Once | INTRAMUSCULAR | Status: AC | PRN
  Administered 2023-07-28: 75 mL via INTRAVENOUS

## 2023-08-02 NOTE — Telephone Encounter (Signed)
 Copied from CRM [id number]. Topic: Clinical - Request for Lab/Test Order >> Aug 02, 2023  8:37 AM Clayton Bibles wrote: Reason for CRM:  She wants to come in tomorrow to get a cortisone shot. She has had one in the past.   Please call her at [phone]

## 2023-08-03 ENCOUNTER — Inpatient Hospital Stay: Payer: Medicare Other | Attending: Physician Assistant

## 2023-08-03 DIAGNOSIS — Z923 Personal history of irradiation: Secondary | ICD-10-CM | POA: Diagnosis not present

## 2023-08-03 DIAGNOSIS — Z85810 Personal history of malignant neoplasm of tongue: Secondary | ICD-10-CM | POA: Insufficient documentation

## 2023-08-03 DIAGNOSIS — M8580 Other specified disorders of bone density and structure, unspecified site: Secondary | ICD-10-CM | POA: Diagnosis not present

## 2023-08-03 DIAGNOSIS — Z9221 Personal history of antineoplastic chemotherapy: Secondary | ICD-10-CM | POA: Diagnosis not present

## 2023-08-03 DIAGNOSIS — Z79890 Hormone replacement therapy (postmenopausal): Secondary | ICD-10-CM | POA: Diagnosis not present

## 2023-08-03 DIAGNOSIS — Z87891 Personal history of nicotine dependence: Secondary | ICD-10-CM | POA: Insufficient documentation

## 2023-08-03 DIAGNOSIS — Z801 Family history of malignant neoplasm of trachea, bronchus and lung: Secondary | ICD-10-CM | POA: Insufficient documentation

## 2023-08-03 DIAGNOSIS — D0511 Intraductal carcinoma in situ of right breast: Secondary | ICD-10-CM | POA: Diagnosis not present

## 2023-08-03 DIAGNOSIS — E039 Hypothyroidism, unspecified: Secondary | ICD-10-CM | POA: Insufficient documentation

## 2023-08-03 DIAGNOSIS — C099 Malignant neoplasm of tonsil, unspecified: Secondary | ICD-10-CM

## 2023-08-03 DIAGNOSIS — Z79811 Long term (current) use of aromatase inhibitors: Secondary | ICD-10-CM | POA: Insufficient documentation

## 2023-08-03 DIAGNOSIS — D0512 Intraductal carcinoma in situ of left breast: Secondary | ICD-10-CM

## 2023-08-03 DIAGNOSIS — R110 Nausea: Secondary | ICD-10-CM | POA: Diagnosis not present

## 2023-08-03 LAB — COMPREHENSIVE METABOLIC PANEL WITH GFR
ALT: 16 U/L (ref 0–44)
AST: 23 U/L (ref 15–41)
Albumin: 3.5 g/dL (ref 3.5–5.0)
Alkaline Phosphatase: 82 U/L (ref 38–126)
Anion gap: 10 (ref 5–15)
BUN: 25 mg/dL — ABNORMAL HIGH (ref 8–23)
CO2: 26 mmol/L (ref 22–32)
Calcium: 9.3 mg/dL (ref 8.9–10.3)
Chloride: 100 mmol/L (ref 98–111)
Creatinine, Ser: 0.93 mg/dL (ref 0.44–1.00)
GFR, Estimated: >60 mL/min (ref >60)
Glucose, Bld: 116 mg/dL — ABNORMAL HIGH (ref 70–99)
Potassium: 3.6 mmol/L (ref 3.5–5.1)
Sodium: 136 mmol/L (ref 135–145)
Total Bilirubin: 0.5 mg/dL (ref 0.0–1.2)
Total Protein: 6.8 g/dL (ref 6.5–8.1)

## 2023-08-03 LAB — CBC WITH DIFFERENTIAL/PLATELET
Abs Immature Granulocytes: 0.01 10*3/uL (ref 0.00–0.07)
Basophils Absolute: 0 10*3/uL (ref 0.0–0.1)
Basophils Relative: 1 %
Eosinophils Absolute: 0.1 10*3/uL (ref 0.0–0.5)
Eosinophils Relative: 3 %
HCT: 41 % (ref 36.0–46.0)
Hemoglobin: 13.6 g/dL (ref 12.0–15.0)
Immature Granulocytes: 0 %
Lymphocytes Relative: 23 %
Lymphs Abs: 0.8 10*3/uL (ref 0.7–4.0)
MCH: 28.8 pg (ref 26.0–34.0)
MCHC: 33.2 g/dL (ref 30.0–36.0)
MCV: 86.9 fL (ref 80.0–100.0)
Monocytes Absolute: 0.3 10*3/uL (ref 0.1–1.0)
Monocytes Relative: 9 %
Neutro Abs: 2.3 10*3/uL (ref 1.7–7.7)
Neutrophils Relative %: 64 %
Platelets: 141 10*3/uL — ABNORMAL LOW (ref 150–400)
RBC: 4.72 MIL/uL (ref 3.87–5.11)
RDW: 13.1 % (ref 11.5–15.5)
WBC: 3.6 10*3/uL — ABNORMAL LOW (ref 4.0–10.5)
nRBC: 0 % (ref 0.0–0.2)

## 2023-08-03 LAB — VITAMIN D 25 HYDROXY (VIT D DEFICIENCY, FRACTURES): Vit D, 25-Hydroxy: 46.09 ng/mL (ref 30–100)

## 2023-08-03 LAB — TSH: TSH: 3.971 u[IU]/mL (ref 0.350–4.500)

## 2023-08-10 ENCOUNTER — Inpatient Hospital Stay: Payer: Medicare Other | Admitting: Hematology

## 2023-08-10 VITALS — BP 120/74 | HR 92 | Temp 98.2°F | Resp 20 | Wt 179.0 lb

## 2023-08-10 DIAGNOSIS — D0512 Intraductal carcinoma in situ of left breast: Secondary | ICD-10-CM

## 2023-08-10 DIAGNOSIS — E559 Vitamin D deficiency, unspecified: Secondary | ICD-10-CM | POA: Diagnosis not present

## 2023-08-10 DIAGNOSIS — Z9221 Personal history of antineoplastic chemotherapy: Secondary | ICD-10-CM | POA: Diagnosis not present

## 2023-08-10 DIAGNOSIS — Z87891 Personal history of nicotine dependence: Secondary | ICD-10-CM | POA: Diagnosis not present

## 2023-08-10 DIAGNOSIS — D0511 Intraductal carcinoma in situ of right breast: Secondary | ICD-10-CM

## 2023-08-10 DIAGNOSIS — Z923 Personal history of irradiation: Secondary | ICD-10-CM | POA: Diagnosis not present

## 2023-08-10 DIAGNOSIS — E039 Hypothyroidism, unspecified: Secondary | ICD-10-CM | POA: Diagnosis not present

## 2023-08-10 DIAGNOSIS — M8580 Other specified disorders of bone density and structure, unspecified site: Secondary | ICD-10-CM | POA: Diagnosis not present

## 2023-08-10 DIAGNOSIS — Z79811 Long term (current) use of aromatase inhibitors: Secondary | ICD-10-CM | POA: Diagnosis not present

## 2023-08-10 DIAGNOSIS — R110 Nausea: Secondary | ICD-10-CM | POA: Diagnosis not present

## 2023-08-10 DIAGNOSIS — Z85810 Personal history of malignant neoplasm of tongue: Secondary | ICD-10-CM | POA: Diagnosis not present

## 2023-08-10 DIAGNOSIS — Z801 Family history of malignant neoplasm of trachea, bronchus and lung: Secondary | ICD-10-CM | POA: Diagnosis not present

## 2023-08-10 NOTE — Patient Instructions (Addendum)
 [facility] at [facility] Discharge Instructions   You were seen and examined today by Dr. Ellin Saba.  He reviewed the results of your lab work which are normal/stable.   We will arrange for you to have a mammogram again in July.   Continue anastrozole as prescribed.   We will see you back in 6 months. We will repeat lab work prior to this visit.   Return as scheduled.    Thank you for choosing [facility] at [facility] to provide your oncology and hematology care.  To afford each patient quality time with our provider, please arrive at least 15 minutes before your scheduled appointment time.   If you have a lab appointment with the Cancer Center please come in thru the Main Entrance and check in at the main information desk.  You need to re-schedule your appointment should you arrive 10 or more minutes late.  We strive to give you quality time with our providers, and arriving late affects you and other patients whose appointments are after yours.  Also, if you no show three or more times for appointments you may be dismissed from the clinic at the providers discretion.     Again, thank you for choosing [facility].  Our hope is that these requests will decrease the amount of time that you wait before being seen by our physicians.       _____________________________________________________________  Should you have questions after your visit to [facility], please contact our office at [phone] and follow the prompts.  Our office hours are 8:00 a.m. and 4:30 p.m. Monday - Friday.  Please note that voicemails left after 4:00 p.m. may not be returned until the following business day.  We are closed weekends and major holidays.  You do have access to a nurse 24-7, just call the main number to the clinic [phone] and do not press any options, hold on the line and a nurse will answer the phone.    For prescription  refill requests, have your pharmacy contact our office and allow 72 hours.    Due to Covid, you will need to wear a mask upon entering the hospital. If you do not have a mask, a mask will be given to you at the Main Entrance upon arrival. For doctor visits, patients may have 1 support person age 69 or older with them. For treatment visits, patients can not have anyone with them due to social distancing guidelines and our immunocompromised population.

## 2023-08-10 NOTE — Progress Notes (Signed)
 [facility] [address], [place] [address]    Clinic Day:  08/10/23   Referring physician: Anabel Halon, MD  Patient Care Team: Anabel Halon, MD as PCP - General (Internal Medicine) Marjo Bicker, MD as PCP - Cardiology (Cardiology) Jena Gauss Gerrit Friends, MD as Consulting Physician (Gastroenterology) Doreatha Massed, MD as Medical Oncologist (Medical Oncology) Therese Sarah, RN as Oncology Nurse Navigator (Medical Oncology)   ASSESSMENT & PLAN:   Assessment: Right breast DCIS, high-grade: - She felt a lump in the right breast UOQ 2 months ago with some soreness. - Mammogram (10/28/2021) calcifications in the UOQ right breast. - Biopsy (11/17/2021): High-grade DCIS, solid type with comedonecrosis.  Negative for invasive carcinoma.  Microcalcification present.  DCIS measures 6 mm in greatest linear extent.  ER 60% positive and PR 1% positive. - She had history of fibrocystic disease and had previously cyst removals x3 which were benign. - Right breast lumpectomy on 12/15/2021 - Pathology: Focus of microinvasive carcinoma, less than 1 mm arising in a background of high-grade DCIS with necrosis and calcifications.  Margins negative for invasive carcinoma.  DCIS is focally less than 1 mm from posterior margin.  ER 60% and PR 1% positive.  PT15mi, PNx. - XRT to the right breast from 02/02/2022 through 02/13/2022 - Anastrozole started 01/06/2022    Social/family history: - She lives at home by herself.  She is seen with her granddaughter.  She is independent of ADLs and IADLs.  She did construction/electrical work.  Quit smoking in 1984. - Maternal grandfather had lung cancer.  3.  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  Plan: 1.  Right breast DCIS with microinvasion, ER/PR positive: - She is tolerating anastrozole very well. - Labs from 08/03/2023 normal LFTs, CBC  with mild leukopenia and thrombocytopenia stable. - No lymphadenopathy palpable. - I recommend follow-up in 6 months with repeat mammogram in July.   2.  Osteopenia (DEXA 12/25/2021 T-score -1.7): - She will continue calcium and vitamin D supplements.  Latest vitamin D is 46.   3.  Nutrition: - She cannot eat any solid food since she completed chemoradiation in December 2015.  Continue ensures 4 cans (350 cal) with 8 ounces of milk per day.  Weight is stable.   4.  Hypothyroidism: - Synthroid increased to 100 mcg daily in February.  Latest TSH is in the normal range at 3.9, down from 5.09.  5.  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 - Will obtain mammogram in July.  Orders Placed This Encounter  Procedures   MM DIAG BREAST TOMO BILATERAL    Standing Status:   Future    Expected Date:   11/11/2023    Expiration Date:   08/09/2024    Reason for Exam (SYMPTOM  OR DIAGNOSIS REQUIRED):   breast cancer screening; hx bilateral breast cancer    Preferred imaging location?:   [facility]   VITAMIN D 25 Hydroxy (Vit-D Deficiency, Fractures)    Standing Status:   Future    Expected Date:   02/07/2024    Expiration Date:   08/09/2024   CBC with Differential    Standing Status:   Future    Expected Date:   02/07/2024    Expiration Date:   08/09/2024   Comprehensive metabolic panel    Standing Status:   Future  Expected Date:   02/07/2024    Expiration Date:   08/09/2024      Alben Deeds Teague,acting as a scribe for Doreatha Massed, MD.,have documented all relevant documentation on the behalf of Doreatha Massed, MD,as directed by  Doreatha Massed, MD while in the presence of Doreatha Massed, MD.  I, Doreatha Massed MD, have reviewed the above documentation for accuracy and completeness, and I agree with the above.     Doreatha Massed, MD   4/8/20253:23 PM  CHIEF COMPLAINT:   Diagnosis: Ductal carcinoma in situ  (DCIS) of right breast    Cancer Staging  Ductal carcinoma in situ (DCIS) of left breast Staging form: Breast, AJCC 8th Edition - Clinical stage from 11/26/2022: Stage 0 (cTis (DCIS), cN0, cM0, ER-, PR-, HER2: Not Assessed) - Signed by Doreatha Massed, MD on 11/26/2022  Ductal carcinoma in situ (DCIS) of right breast Staging form: Breast, AJCC 8th Edition - Clinical stage from 11/25/2021: Stage 0 (cTis (DCIS), cN0, cM0, ER+, PR+, HER2: Not Assessed) - Unsigned    Prior Therapy: 1.  Right breast lumpectomy on 12/15/2021 2.  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   01/19/2014 PET scan   Tongue mass with 2 level II Right neck nodes T2N2b, Stage IVA   02/08/2014 - 04/03/2014 Radiation Therapy   Initiation of radiation therapy with 70 Gy delivered to the right base of tongue/tonsil 63 Gy to high risk nodal echelons, 56 Gy to the intermediate r base of tongue/tonsil/bilateral neck nodes   02/08/2014 - 03/15/2014 Chemotherapy   weekly cisplatin, only 4 doses given, severe toxicity with neutropenia, dehydration, nausea, vomiting, hospitalization and obstipation   10/24/2015 PET scan   low level non malig range hyper metab in L apical sub solid pulm nodule, suspicious for low grade adeno. diffuse tongue hypermetab without CT correlation   10/28/2015 Procedure   Bronchoscopy navigation, electromagnetic navigation, bronchoscopy with biopsy of LUL lung lesion, placement of fiducial markers X 3   10/28/2015 Pathology Results   Scant benign lung tissue, no tumor seen   Ductal carcinoma in situ (DCIS) of left breast  11/26/2022 Initial Diagnosis   Ductal carcinoma in situ (DCIS) of left breast   11/26/2022 Cancer Staging   Staging form: Breast, AJCC 8th Edition - Clinical stage from 11/26/2022: Stage 0 (cTis  (DCIS), cN0, cM0, ER-, PR-, HER2: Not Assessed) - Signed by Doreatha Massed, MD on 11/26/2022 Histopathologic type: Intraductal carcinoma, noninfiltrating, NOS Stage prefix: Initial diagnosis Nuclear grade: G3      INTERVAL HISTORY:   Candace Lewis is a 83 y.o. female presenting to clinic today for follow up of  high-grade right breast DCIS, ER/PR positive. She was last seen by me on 02/02/23.  Today, she states that she is doing well overall.  Her appetite level is at 0%. Her energy level is at 0%.   Devinne is tolerating anastrozole well and denies any side effects, including hot flashes, aches, or pains. She is unable to swallow solid foods and drinks 4 protein drinks a day as well as milk.   She reports thyroid issues, which is managed by Dr. Allena Katz. CT soft tissue neck was ordered by Dr. Allena Katz and done on 07/28/22. Her synthroid dosage has been increased to 100 mcg daily in February 2025.  PAST MEDICAL HISTORY:   Past  Medical History: Past Medical History:  Diagnosis Date   Arthritis    lower back   Constipation    Diverticulitis    Ectopic pregnancy 1977   Esophageal cancer (HCC) 2015   tongue cancer, throat cancer   GERD (gastroesophageal reflux disease)    Headache    migraines   Hypertension    no longer taking meds   Hypokalemia 2015   Hypothyroidism    Lung nodule, multiple 01/08/2015   Macular degeneration of right eye    Malignant neoplasm of tongue (HCC) 01/24/2014   Throat cancer (HCC)    Thyroid disease     Surgical History: Past Surgical History:  Procedure Laterality Date   BIOPSY PHARYNX  2015   BREAST BIOPSY Bilateral    benign   BREAST BIOPSY Right 11/10/2021   HIGH-GRADE DUCTAL CARCINOMA IN SITU, SOLID TYPE WITH COMEDONECROSIS. MICROCALCIFICATIONS PRESENT.   BREAST BIOPSY Left 11/16/2022   FOCI OF DUCTAL CARCINOMA IN SITU, HIGH GRADE - NECROSIS: PRESENT, PROMINENT - CALCIFICATIONSMM LT BREAST BX W LOC DEV 1ST LESION IMAGE BX SPEC STEREO GUIDE  11/16/2022 [facility]   CHOLECYSTECTOMY  2005   ESOPHAGOGASTRODUODENOSCOPY  06/19/2014   Dr. Teena Dunk: LA Class B esophagitis. PEG tube in place. erythema in proximal esophagus.    ESOPHAGOGASTRODUODENOSCOPY (EGD) WITH PROPOFOL N/A 03/11/2017   Web in upper third of esophagus s/p dilation with scope passage, small hiatal hernia, normal duodenum, no specimens collected   FUDUCIAL PLACEMENT N/A 10/28/2015   Procedure: PLACEMENT OF FUDUCIAL TIMES THREE; LEFT UPPER LOBE BIOPSY;  Surgeon: Delight Ovens, MD;  Location: [facility] OR;  Service: Thoracic;  Laterality: N/A;   MASTECTOMY, PARTIAL Left 1984   no cancer, fibrocystic breast disease   SKIN SURGERY  1960   moles removed from various areas per patient report   TONSILLECTOMY  1950   per patient report   TUBAL LIGATION  1976   VAGINAL HYSTERECTOMY  1981   vaginal hyst per patient report   VIDEO BRONCHOSCOPY WITH ENDOBRONCHIAL NAVIGATION N/A 10/28/2015   Procedure: VIDEO BRONCHOSCOPY WITH ENDOBRONCHIAL NAVIGATION;  Surgeon: Delight Ovens, MD;  Location: [facility] OR;  Service: Thoracic;  Laterality: N/A;    Social History: Social History   Socioeconomic History   Marital status: Widowed    Spouse name: Not on file   Number of children: 2   Years of education: Not on file   Highest education level: Not on file  Occupational History   Occupation: retired  Tobacco Use   Smoking status: Former    Current packs/day: 0.00    Average packs/day: 1 pack/day for 23.0 years (23.0 ttl pk-yrs)    Types: Cigarettes    Start date: 05/04/1959    Quit date: 05/03/1982    Years since quitting: 41.2   Smokeless tobacco: Never  Vaping Use   Vaping status: Never Used  Substance and Sexual Activity   Alcohol use: No    Alcohol/week: 0.0 standard drinks of alcohol   Drug use: No   Sexual activity: Never    Birth control/protection: None  Other Topics Concern   Not on file  Social History Narrative   Not on file   Social Drivers of Health    Financial Resource Strain: Low Risk  (07/27/2023)   Overall Financial Resource Strain (CARDIA)    Difficulty of Paying Living Expenses: Not hard at all  Food Insecurity: No Food Insecurity (07/27/2023)   Hunger Vital Sign    Worried About Running Out of Food in  the Last Year: Never true    Ran Out of Food in the Last Year: Never true  Transportation Needs: No Transportation Needs (07/27/2023)   PRAPARE - Administrator, Civil Service (Medical): No    Lack of Transportation (Non-Medical): No  Physical Activity: Insufficiently Active (07/27/2023)   Exercise Vital Sign    Days of Exercise per Week: 1 day    Minutes of Exercise per Session: 10 min  Stress: No Stress Concern Present (07/27/2023)   [employer] of Occupational Health - Occupational Stress Questionnaire    Feeling of Stress : Not at all  Social Connections: Socially Isolated (07/27/2023)   Social Connection and Isolation Panel [NHANES]    Frequency of Communication with Friends and Family: More than three times a week    Frequency of Social Gatherings with Friends and Family: Twice a week    Attends Religious Services: Never    Database administrator or Organizations: No    Attends Banker Meetings: Never    Marital Status: Widowed  Intimate Partner Violence: Not At Risk (07/27/2023)   Humiliation, Afraid, Rape, and Kick questionnaire    Fear of Current or Ex-Partner: No    Emotionally Abused: No    Physically Abused: No    Sexually Abused: No    Family History: Family History  Problem Relation Age of Onset   Heart disease Mother     Current Medications:  Current Outpatient Medications:    amoxicillin-clavulanate (AUGMENTIN) 875-125 MG tablet, Take 1 tablet by mouth 2 (two) times daily., Disp: 14 tablet, Rfl: 0   anastrozole (ARIMIDEX) 1 MG tablet, TAKE 1 TABLET BY MOUTH ONCE DAILY .  DISSOLVE  IN  WATER  AND  DRINK  ONCE  DAILY, Disp: 90 tablet, Rfl: 0   Aspirin-Acetaminophen (GOODYS  BACK & BODY PAIN) 500-325 MG PACK, Take 1 packet by mouth daily as needed (pain)., Disp: , Rfl:    diclofenac Sodium (VOLTAREN) 1 % GEL, Apply 2 g topically 4 (four) times daily. Apply to painful area around right ribs and right breast. (Patient taking differently: Apply 2 g topically 4 (four) times daily as needed (pain). Apply to painful area around right ribs and right breast.), Disp: 50 g, Rfl: 0   gabapentin (NEURONTIN) 100 MG capsule, Take 1 capsule (100 mg total) by mouth at bedtime., Disp: 30 capsule, Rfl: 2   levothyroxine (SYNTHROID) 100 MCG tablet, Take 1 tablet (100 mcg total) by mouth daily before breakfast., Disp: 90 tablet, Rfl: 1   metoprolol tartrate (LOPRESSOR) 25 MG tablet, Take 1 tablet by mouth twice daily, Disp: 180 tablet, Rfl: 2   omeprazole (PRILOSEC OTC) 20 MG tablet, Take 20 mg by mouth daily., Disp: , Rfl:    traMADol (ULTRAM) 50 MG tablet, Take 1 tablet (50 mg total) by mouth every 6 (six) hours as needed., Disp: 15 tablet, Rfl: 0   triamcinolone cream (KENALOG) 0.1 %, Apply 1 Application topically 2 (two) times daily. (Patient taking differently: Apply 1 Application topically 2 (two) times daily as needed (irritation/itching).), Disp: 30 g, Rfl: 0   trolamine salicylate (ASPERCREME) 10 % cream, Apply 1 Application topically as needed for muscle pain., Disp: , Rfl:    Allergies: Allergies  Allergen Reactions   Adhesive [Tape] Rash    Reports rash and blistering if any adhesive tape   Latex Rash    Rash and skin blistering per patient report    REVIEW OF SYSTEMS:   Review of Systems  Constitutional:  Negative for chills, fatigue and fever.  HENT:   Negative for lump/mass, mouth sores, nosebleeds, sore throat and trouble swallowing.   Eyes:  Negative for eye problems.  Respiratory:  Positive for cough. Negative for shortness of breath.   Cardiovascular:  Negative for chest pain, leg swelling and palpitations.  Gastrointestinal:  Positive for nausea. Negative for  abdominal pain, constipation, diarrhea and vomiting.  Genitourinary:  Negative for bladder incontinence, difficulty urinating, dysuria, frequency, hematuria and nocturia.   Musculoskeletal:  Negative for arthralgias, back pain, flank pain, myalgias and neck pain.       +left breast tenderness, 3/10 severity  Skin:  Negative for itching and rash.  Neurological:  Positive for dizziness and headaches. Negative for numbness.  Hematological:  Does not bruise/bleed easily.  Psychiatric/Behavioral:  Positive for depression. Negative for sleep disturbance and suicidal ideas. The patient is nervous/anxious.   All other systems reviewed and are negative.    VITALS:   Blood pressure 120/74, pulse 92, temperature 98.2 F (36.8 C), temperature source Tympanic, resp. rate 20, weight 179 lb 0.2 oz (81.2 kg), SpO2 98%.  Wt Readings from Last 3 Encounters:  08/10/23 179 lb 0.2 oz (81.2 kg)  07/27/23 183 lb (83 kg)  06/21/23 183 lb (83 kg)    Body mass index is 27.62 kg/m.  Performance status (ECOG): 1 - Symptomatic but completely ambulatory  PHYSICAL EXAM:   Physical Exam Vitals and nursing note reviewed. Exam conducted with a chaperone present.  Constitutional:      Appearance: Normal appearance.  Cardiovascular:     Rate and Rhythm: Normal rate and regular rhythm.     Pulses: Normal pulses.     Heart sounds: Normal heart sounds.  Pulmonary:     Effort: Pulmonary effort is normal.     Breath sounds: Normal breath sounds.  Abdominal:     Palpations: Abdomen is soft. There is no hepatomegaly, splenomegaly or mass.     Tenderness: There is no abdominal tenderness.  Musculoskeletal:     Right lower leg: No edema.     Left lower leg: No edema.  Lymphadenopathy:     Cervical: No cervical adenopathy.     Right cervical: No superficial, deep or posterior cervical adenopathy.    Left cervical: No superficial, deep or posterior cervical adenopathy.     Upper Body:     Right upper body: No  supraclavicular or axillary adenopathy.     Left upper body: No supraclavicular or axillary adenopathy.  Neurological:     General: No focal deficit present.     Mental Status: She is alert and oriented to person, place, and time.  Psychiatric:        Mood and Affect: Mood normal.        Behavior: Behavior normal.     LABS:      Latest Ref Rng & Units 08/03/2023    1:23 PM 11/03/2022    1:23 PM 10/22/2022   10:02 AM  CBC  WBC 4.0 - 10.5 K/uL 3.6  3.5  3.5   Hemoglobin 12.0 - 15.0 g/dL 40.9  81.1  91.4   Hematocrit 36.0 - 46.0 % 41.0  41.1  39.6   Platelets 150 - 400 K/uL 141  146  151       Latest Ref Rng & Units 08/03/2023    1:23 PM 11/03/2022    1:23 PM 10/22/2022   10:02 AM  CMP  Glucose 70 - 99 mg/dL 782  956  96   BUN 8 - 23 mg/dL 25  26  27    Creatinine 0.44 - 1.00 mg/dL 1.88  4.16  6.06   Sodium 135 - 145 mmol/L 136  138  141   Potassium 3.5 - 5.1 mmol/L 3.6  3.7  4.2   Chloride 98 - 111 mmol/L 100  105  105   CO2 22 - 32 mmol/L 26  25  22    Calcium 8.9 - 10.3 mg/dL 9.3  9.2  9.8   Total Protein 6.5 - 8.1 g/dL 6.8  6.8  6.8   Total Bilirubin 0.0 - 1.2 mg/dL 0.5  0.6  0.7   Alkaline Phos 38 - 126 U/L 82  72  95   AST 15 - 41 U/L 23  22  19    ALT 0 - 44 U/L 16  18  14       No results found for: "CEA1", "CEA" / No results found for: "CEA1", "CEA" No results found for: "PSA1" No results found for: "TKZ601" No results found for: "CAN125"  No results found for: "TOTALPROTELP", "ALBUMINELP", "A1GS", "A2GS", "BETS", "BETA2SER", "GAMS", "MSPIKE", "SPEI" No results found for: "TIBC", "FERRITIN", "IRONPCTSAT" Lab Results  Component Value Date   LDH 155 11/16/2018   LDH 140 04/18/2018   LDH 142 03/16/2018     STUDIES:   No results found.

## 2023-10-19 ENCOUNTER — Ambulatory Visit (INDEPENDENT_AMBULATORY_CARE_PROVIDER_SITE_OTHER): Payer: Medicare Other | Admitting: Internal Medicine

## 2023-10-19 ENCOUNTER — Encounter: Payer: Self-pay | Admitting: Internal Medicine

## 2023-10-19 VITALS — BP 106/77 | HR 87 | Ht 67.5 in | Wt 177.2 lb

## 2023-10-19 DIAGNOSIS — F411 Generalized anxiety disorder: Secondary | ICD-10-CM

## 2023-10-19 DIAGNOSIS — E039 Hypothyroidism, unspecified: Secondary | ICD-10-CM | POA: Diagnosis not present

## 2023-10-19 DIAGNOSIS — D0512 Intraductal carcinoma in situ of left breast: Secondary | ICD-10-CM | POA: Diagnosis not present

## 2023-10-19 DIAGNOSIS — K2100 Gastro-esophageal reflux disease with esophagitis, without bleeding: Secondary | ICD-10-CM | POA: Diagnosis not present

## 2023-10-19 DIAGNOSIS — Z0001 Encounter for general adult medical examination with abnormal findings: Secondary | ICD-10-CM

## 2023-10-19 DIAGNOSIS — I10 Essential (primary) hypertension: Secondary | ICD-10-CM | POA: Diagnosis not present

## 2023-10-19 NOTE — Progress Notes (Signed)
 Established Patient Office Visit  Subjective:  Patient ID: Candace Lewis, female    DOB: 10/18/1940  Age: 83 y.o. MRN: [id number]  CC:  Chief Complaint  Patient presents with   Annual Exam    Annual exam 4 month f/u    Insect Bite    Reports 2 bee stings on right foot, has some swelling.    HPI Candace Lewis is a 83 y.o. female with past medical history of breast ca., tongue ca., HTN, HLD, hypothyroidism and GERD who presents for f/u of her chronic medical conditions.  HTN: BP is well-controlled. Takes medication - metoprolol  tartrate 25 mg BID regularly. Patient denies headache, dizziness or dyspnea. She has palpitations, but usually controlled with Metoprolol . She has stopped taking Crestor for HLD. Her lipid profile was wnl in 06/24.   GERD: She takes Omeprazole  for GERD. Denies any nausea or vomiting.  She has chronic dysphagia and can only tolerate liquid.  She takes Equate vanilla flavor protein supplement QID.  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   She has history of hypothyroidism, and takes levothyroxine  100 mcg daily.  Denies any recent change in weight or appetite.  She has right sided neck mass behind TMJ area, which has been painful upon chewing and movement (flexion of neck).  US  of neck was unremarkable in 01/24. She has seen Dr Darlin Ehrlich as well, and was reassured about it being benign.   She reports chronic insomnia.  She has spells of anxiety at nighttime.  Denies any anhedonia, SI or HI currently.  She has tried melatonin without much relief.  She did not like the effect of trazodone .  She tried Elavil , but felt lack of control due to long sleep hours with it.  She was given Lexapro  for GAD, but felt numb (no feeling) with it, and has stopped it.  Left breast ca. status post partial mastectomy: She had partial mastectomy in 08/24.  She has completed radiation therapy.  She is on anastrozole  since her right breast ca. in  2023.  She reports pins and needlelike sensation in the breast area since her treatment.  She has been taking Goody powder for it without much relief. She was given Gabapentin  for neuropathic pain, but had dizziness with it.   Past Medical History:  Diagnosis Date   Arthritis    lower back   Constipation    Diverticulitis    Ectopic pregnancy 1977   Esophageal cancer (HCC) 2015   tongue cancer, throat cancer   GERD (gastroesophageal reflux disease)    Headache    migraines   Hypertension    no longer taking meds   Hypokalemia 2015   Hypothyroidism    Lung nodule, multiple 01/08/2015   Macular degeneration of right eye    Malignant neoplasm of tongue (HCC) 01/24/2014   Throat cancer (HCC)    Thyroid  disease     Past Surgical History:  Procedure Laterality Date   BIOPSY PHARYNX  2015   BREAST BIOPSY Bilateral    benign   BREAST BIOPSY Right 11/10/2021   HIGH-GRADE DUCTAL CARCINOMA IN SITU, SOLID TYPE WITH COMEDONECROSIS. MICROCALCIFICATIONS PRESENT.   BREAST BIOPSY Left 11/16/2022   FOCI OF DUCTAL CARCINOMA IN SITU, HIGH GRADE - NECROSIS: PRESENT, PROMINENT - CALCIFICATIONSMM LT BREAST BX W LOC DEV 1ST LESION IMAGE BX SPEC STEREO GUIDE 11/16/2022 [facility]   CHOLECYSTECTOMY  2005   ESOPHAGOGASTRODUODENOSCOPY  06/19/2014   Dr. Alline Ivans: [place]  Class B esophagitis. PEG tube in place. erythema in proximal esophagus.    ESOPHAGOGASTRODUODENOSCOPY (EGD) WITH PROPOFOL  N/A 03/11/2017   Web in upper third of esophagus s/p dilation with scope passage, small hiatal hernia, normal duodenum, no specimens collected   FUDUCIAL PLACEMENT N/A 10/28/2015   Procedure: PLACEMENT OF FUDUCIAL TIMES THREE; LEFT UPPER LOBE BIOPSY;  Surgeon: Norita Beauvais, MD;  Location: [facility] OR;  Service: Thoracic;  Laterality: N/A;   MASTECTOMY, PARTIAL Left 1984   no cancer, fibrocystic breast disease   SKIN SURGERY  1960   moles removed from various areas per patient report   TONSILLECTOMY  1950   per  patient report   TUBAL LIGATION  1976   VAGINAL HYSTERECTOMY  1981   vaginal hyst per patient report   VIDEO BRONCHOSCOPY WITH ENDOBRONCHIAL NAVIGATION N/A 10/28/2015   Procedure: VIDEO BRONCHOSCOPY WITH ENDOBRONCHIAL NAVIGATION;  Surgeon: Norita Beauvais, MD;  Location: [facility] OR;  Service: Thoracic;  Laterality: N/A;    Family History  Problem Relation Age of Onset   Heart disease Mother     Social History   Socioeconomic History   Marital status: Widowed    Spouse name: Not on file   Number of children: 2   Years of education: Not on file   Highest education level: Not on file  Occupational History   Occupation: retired  Tobacco Use   Smoking status: Former    Current packs/day: 0.00    Average packs/day: 1 pack/day for 23.0 years (23.0 ttl pk-yrs)    Types: Cigarettes    Start date: 05/04/1959    Quit date: 05/03/1982    Years since quitting: 41.4   Smokeless tobacco: Never  Vaping Use   Vaping status: Never Used  Substance and Sexual Activity   Alcohol use: No    Alcohol/week: 0.0 standard drinks of alcohol   Drug use: No   Sexual activity: Never    Birth control/protection: None  Other Topics Concern   Not on file  Social History Narrative   Not on file   Social Drivers of Health   Financial Resource Strain: Low Risk  (07/27/2023)   Overall Financial Resource Strain (CARDIA)    Difficulty of Paying Living Expenses: Not hard at all  Food Insecurity: No Food Insecurity (07/27/2023)   Hunger Vital Sign    Worried About Running Out of Food in the Last Year: Never true    Ran Out of Food in the Last Year: Never true  Transportation Needs: No Transportation Needs (07/27/2023)   PRAPARE - Administrator, Civil Service (Medical): No    Lack of Transportation (Non-Medical): No  Physical Activity: Insufficiently Active (07/27/2023)   Exercise Vital Sign    Days of Exercise per Week: 1 day    Minutes of Exercise per Session: 10 min  Stress: No Stress  Concern Present (07/27/2023)   [employer] of Occupational Health - Occupational Stress Questionnaire    Feeling of Stress : Not at all  Social Connections: Socially Isolated (07/27/2023)   Social Connection and Isolation Panel    Frequency of Communication with Friends and Family: More than three times a week    Frequency of Social Gatherings with Friends and Family: Twice a week    Attends Religious Services: Never    Database administrator or Organizations: No    Attends Banker Meetings: Never    Marital Status: Widowed  Intimate Partner Violence: Not At Risk (07/27/2023)  Humiliation, Afraid, Rape, and Kick questionnaire    Fear of Current or Ex-Partner: No    Emotionally Abused: No    Physically Abused: No    Sexually Abused: No    Outpatient Medications Prior to Visit  Medication Sig Dispense Refill   anastrozole  (ARIMIDEX ) 1 MG tablet TAKE 1 TABLET BY MOUTH ONCE DAILY .  DISSOLVE  IN  WATER  AND  DRINK  ONCE  DAILY 90 tablet 0   Aspirin-Acetaminophen (GOODYS BACK & BODY PAIN) 500-325 MG PACK Take 1 packet by mouth daily as needed (pain).     diclofenac  Sodium (VOLTAREN ) 1 % GEL Apply 2 g topically 4 (four) times daily. Apply to painful area around right ribs and right breast. (Patient taking differently: Apply 2 g topically 4 (four) times daily as needed (pain). Apply to painful area around right ribs and right breast.) 50 g 0   levothyroxine  (SYNTHROID ) 100 MCG tablet Take 1 tablet (100 mcg total) by mouth daily before breakfast. 90 tablet 1   metoprolol  tartrate (LOPRESSOR ) 25 MG tablet Take 1 tablet by mouth twice daily 180 tablet 2   omeprazole  (PRILOSEC OTC) 20 MG tablet Take 20 mg by mouth daily.     triamcinolone  cream (KENALOG ) 0.1 % Apply 1 Application topically 2 (two) times daily. (Patient taking differently: Apply 1 Application topically 2 (two) times daily as needed (irritation/itching).) 30 g 0   trolamine salicylate (ASPERCREME) 10 % cream Apply 1  Application topically as needed for muscle pain.     amoxicillin -clavulanate (AUGMENTIN ) 875-125 MG tablet Take 1 tablet by mouth 2 (two) times daily. 14 tablet 0   gabapentin  (NEURONTIN ) 100 MG capsule Take 1 capsule (100 mg total) by mouth at bedtime. 30 capsule 2   traMADol  (ULTRAM ) 50 MG tablet Take 1 tablet (50 mg total) by mouth every 6 (six) hours as needed. 15 tablet 0   No facility-administered medications prior to visit.    Allergies  Allergen Reactions   Adhesive [Tape] Rash    Reports rash and blistering if any adhesive tape   Latex Rash    Rash and skin blistering per patient report    ROS Review of Systems  Constitutional:  Negative for chills and fever.  HENT:  Negative for congestion and sore throat.   Eyes:  Negative for pain and discharge.  Respiratory:  Negative for cough and shortness of breath.   Cardiovascular:  Negative for chest pain and palpitations.  Gastrointestinal:  Negative for abdominal pain, diarrhea, nausea and vomiting.  Endocrine: Negative for polydipsia and polyuria.  Genitourinary:  Negative for dysuria and hematuria.  Musculoskeletal:  Positive for neck pain. Negative for neck stiffness.  Skin:  Negative for rash.  Neurological:  Negative for dizziness and weakness.  Psychiatric/Behavioral:  Positive for sleep disturbance. Negative for agitation and behavioral problems. The patient is nervous/anxious.       Objective:    Physical Exam Vitals reviewed.  Constitutional:      General: She is not in acute distress.    Appearance: She is not diaphoretic.  HENT:     Head: Normocephalic and atraumatic.     Mouth/Throat:     Mouth: Mucous membranes are moist.   Eyes:     General: No scleral icterus.    Extraocular Movements: Extraocular movements intact.   Neck:     Comments: Hardening of right submandibular area - chronic Cardiovascular:     Rate and Rhythm: Normal rate and regular rhythm.  Heart sounds: Normal heart sounds. No  murmur heard. Pulmonary:     Breath sounds: Normal breath sounds. No wheezing or rales.  Abdominal:     Palpations: Abdomen is soft.     Tenderness: There is no abdominal tenderness.   Musculoskeletal:     Cervical back: Neck supple. No tenderness.     Right lower leg: No edema.     Left lower leg: No edema.   Skin:    General: Skin is warm.     Findings: No rash.   Neurological:     General: No focal deficit present.     Mental Status: She is alert and oriented to person, place, and time.   Psychiatric:        Mood and Affect: Mood is anxious.        Behavior: Behavior normal.     BP 106/77   Pulse 87   Ht 5' 7.5 (1.715 m)   Wt 177 lb 3.2 oz (80.4 kg)   SpO2 98%   BMI 27.34 kg/m  Wt Readings from Last 3 Encounters:  10/19/23 177 lb 3.2 oz (80.4 kg)  08/10/23 179 lb 0.2 oz (81.2 kg)  07/27/23 183 lb (83 kg)    Lab Results  Component Value Date   TSH 3.971 08/03/2023   Lab Results  Component Value Date   WBC 3.6 (L) 08/03/2023   HGB 13.6 08/03/2023   HCT 41.0 08/03/2023   MCV 86.9 08/03/2023   PLT 141 (L) 08/03/2023   Lab Results  Component Value Date   NA 136 08/03/2023   K 3.6 08/03/2023   CO2 26 08/03/2023   GLUCOSE 116 (H) 08/03/2023   BUN 25 (H) 08/03/2023   CREATININE 0.93 08/03/2023   BILITOT 0.5 08/03/2023   ALKPHOS 82 08/03/2023   AST 23 08/03/2023   ALT 16 08/03/2023   PROT 6.8 08/03/2023   ALBUMIN 3.5 08/03/2023   CALCIUM 9.3 08/03/2023   ANIONGAP 10 08/03/2023   EGFR 63 10/22/2022   Lab Results  Component Value Date   CHOL 153 10/22/2022   Lab Results  Component Value Date   HDL 48 10/22/2022   Lab Results  Component Value Date   LDLCALC 75 10/22/2022   Lab Results  Component Value Date   TRIG 177 (H) 10/22/2022   Lab Results  Component Value Date   CHOLHDL 3.2 10/22/2022   Lab Results  Component Value Date   HGBA1C 5.3 10/22/2022      Assessment & Plan:   Problem List Items Addressed This Visit        Cardiovascular and Mediastinum   Essential hypertension   BP Readings from Last 1 Encounters:  10/19/23 106/77   Well-controlled with Metoprolol  25 mg BID Tried to switch from Lopressor  to Toprol , but did not help with palpitations - placed back on Lopressor  Counseled for compliance with the medications        Digestive   GERD (gastroesophageal reflux disease)   Well controlled with omeprazole         Endocrine   Hypothyroidism   Lab Results  Component Value Date   TSH 3.971 08/03/2023   On Levothyroxine  100 mcg once daily Will recheck TSH and free T4      Relevant Orders   TSH + free T4     Other   Encounter for general adult medical examination with abnormal findings - Primary   Physical exam as documented. Fasting blood tests reviewed today.  Ductal carcinoma in situ (DCIS) of left breast   S/p left partial mastectomy on 12/30/22 S/p radiotherapy  Right breast lumpectomy on 12/15/2021 XRT to the right breast from 02/02/2022 through 02/13/2022 Anastrozole  started 01/06/2022 Followed by Oncology      GAD (generalized anxiety disorder)   Her insomnia is likely due to GAD Has tried trazodone  and Elavil  for insomnia Had started Lexapro  5 mg once daily for GAD, but felt lack of feelings Prefers to avoid any medicine for now for GAD        No orders of the defined types were placed in this encounter.   Follow-up: Return in about 4 months (around 02/18/2024) for HTN and GERD.    Meldon Sport, MD

## 2023-10-19 NOTE — Assessment & Plan Note (Signed)
 Well controlled with omeprazole.

## 2023-10-19 NOTE — Patient Instructions (Signed)
 Please continue to take medications as prescribed.  Please continue to take protein shakes regularly.  Please get blood tests done at cancer center.

## 2023-10-19 NOTE — Assessment & Plan Note (Signed)
 Lab Results  Component Value Date   TSH 3.971 08/03/2023   On Levothyroxine  100 mcg once daily Will recheck TSH and free T4

## 2023-10-19 NOTE — Assessment & Plan Note (Signed)
 Her insomnia is likely due to GAD Has tried trazodone and Elavil for insomnia Had started Lexapro 5 mg once daily for GAD, but felt lack of feelings Prefers to avoid any medicine for now for GAD

## 2023-10-19 NOTE — Assessment & Plan Note (Signed)
 BP Readings from Last 1 Encounters:  10/19/23 106/77   Well-controlled with Metoprolol  25 mg BID Tried to switch from Lopressor  to Toprol , but did not help with palpitations - placed back on Lopressor  Counseled for compliance with the medications

## 2023-10-19 NOTE — Assessment & Plan Note (Signed)
Physical exam as documented. Fasting blood tests reviewed today.

## 2023-10-19 NOTE — Assessment & Plan Note (Signed)
 S/p left partial mastectomy on 12/30/22 S/p radiotherapy  Right breast lumpectomy on 12/15/2021 XRT to the right breast from 02/02/2022 through 02/13/2022 Anastrozole started 01/06/2022 Followed by Oncology

## 2023-10-20 ENCOUNTER — Ambulatory Visit: Payer: Self-pay | Admitting: Internal Medicine

## 2023-10-20 LAB — TSH+FREE T4
Free T4: 1.26 ng/dL (ref 0.82–1.77)
TSH: 2.95 u[IU]/mL (ref 0.450–4.500)

## 2023-10-27 ENCOUNTER — Other Ambulatory Visit (HOSPITAL_COMMUNITY): Payer: Self-pay | Admitting: Hematology

## 2023-10-27 DIAGNOSIS — Z853 Personal history of malignant neoplasm of breast: Secondary | ICD-10-CM

## 2023-11-09 ENCOUNTER — Ambulatory Visit (HOSPITAL_COMMUNITY)

## 2023-11-09 ENCOUNTER — Encounter (HOSPITAL_COMMUNITY)

## 2023-12-14 ENCOUNTER — Other Ambulatory Visit: Payer: Self-pay | Admitting: Internal Medicine

## 2023-12-14 DIAGNOSIS — E039 Hypothyroidism, unspecified: Secondary | ICD-10-CM

## 2023-12-16 DIAGNOSIS — Z86000 Personal history of in-situ neoplasm of breast: Secondary | ICD-10-CM | POA: Diagnosis not present

## 2023-12-16 DIAGNOSIS — Z9011 Acquired absence of right breast and nipple: Secondary | ICD-10-CM | POA: Diagnosis not present

## 2023-12-16 DIAGNOSIS — Z85810 Personal history of malignant neoplasm of tongue: Secondary | ICD-10-CM | POA: Diagnosis not present

## 2023-12-16 DIAGNOSIS — C50411 Malignant neoplasm of upper-outer quadrant of right female breast: Secondary | ICD-10-CM | POA: Diagnosis not present

## 2023-12-16 DIAGNOSIS — Z79811 Long term (current) use of aromatase inhibitors: Secondary | ICD-10-CM | POA: Diagnosis not present

## 2023-12-16 DIAGNOSIS — Z923 Personal history of irradiation: Secondary | ICD-10-CM | POA: Diagnosis not present

## 2023-12-16 DIAGNOSIS — Z170 Estrogen receptor positive status [ER+]: Secondary | ICD-10-CM | POA: Diagnosis not present

## 2023-12-16 DIAGNOSIS — E890 Postprocedural hypothyroidism: Secondary | ICD-10-CM | POA: Diagnosis not present

## 2023-12-24 ENCOUNTER — Encounter: Payer: Self-pay | Admitting: Radiology

## 2024-01-11 ENCOUNTER — Ambulatory Visit (HOSPITAL_COMMUNITY)
Admission: RE | Admit: 2024-01-11 | Discharge: 2024-01-11 | Disposition: A | Discharge status: Home / Self Care | Source: Ambulatory Visit | Attending: Hematology | Admitting: Hematology

## 2024-01-11 DIAGNOSIS — D0512 Intraductal carcinoma in situ of left breast: Secondary | ICD-10-CM | POA: Insufficient documentation

## 2024-01-11 DIAGNOSIS — R92323 Mammographic fibroglandular density, bilateral breasts: Secondary | ICD-10-CM | POA: Diagnosis not present

## 2024-01-11 DIAGNOSIS — D0511 Intraductal carcinoma in situ of right breast: Secondary | ICD-10-CM | POA: Insufficient documentation

## 2024-01-11 DIAGNOSIS — Z853 Personal history of malignant neoplasm of breast: Secondary | ICD-10-CM

## 2024-02-02 ENCOUNTER — Inpatient Hospital Stay: Attending: Oncology

## 2024-02-02 DIAGNOSIS — Z923 Personal history of irradiation: Secondary | ICD-10-CM | POA: Insufficient documentation

## 2024-02-02 DIAGNOSIS — Z9221 Personal history of antineoplastic chemotherapy: Secondary | ICD-10-CM | POA: Insufficient documentation

## 2024-02-02 DIAGNOSIS — M8580 Other specified disorders of bone density and structure, unspecified site: Secondary | ICD-10-CM | POA: Insufficient documentation

## 2024-02-02 DIAGNOSIS — Z79811 Long term (current) use of aromatase inhibitors: Secondary | ICD-10-CM | POA: Diagnosis not present

## 2024-02-02 DIAGNOSIS — Z85810 Personal history of malignant neoplasm of tongue: Secondary | ICD-10-CM | POA: Insufficient documentation

## 2024-02-02 DIAGNOSIS — M542 Cervicalgia: Secondary | ICD-10-CM | POA: Insufficient documentation

## 2024-02-02 DIAGNOSIS — R634 Abnormal weight loss: Secondary | ICD-10-CM | POA: Insufficient documentation

## 2024-02-02 DIAGNOSIS — E559 Vitamin D deficiency, unspecified: Secondary | ICD-10-CM | POA: Insufficient documentation

## 2024-02-02 DIAGNOSIS — F32A Depression, unspecified: Secondary | ICD-10-CM | POA: Insufficient documentation

## 2024-02-02 DIAGNOSIS — G4700 Insomnia, unspecified: Secondary | ICD-10-CM | POA: Insufficient documentation

## 2024-02-02 DIAGNOSIS — Z801 Family history of malignant neoplasm of trachea, bronchus and lung: Secondary | ICD-10-CM | POA: Insufficient documentation

## 2024-02-02 DIAGNOSIS — Z87891 Personal history of nicotine dependence: Secondary | ICD-10-CM | POA: Insufficient documentation

## 2024-02-02 DIAGNOSIS — D0511 Intraductal carcinoma in situ of right breast: Secondary | ICD-10-CM | POA: Insufficient documentation

## 2024-02-02 DIAGNOSIS — D0512 Intraductal carcinoma in situ of left breast: Secondary | ICD-10-CM

## 2024-02-02 DIAGNOSIS — D696 Thrombocytopenia, unspecified: Secondary | ICD-10-CM | POA: Insufficient documentation

## 2024-02-02 LAB — CBC WITH DIFFERENTIAL/PLATELET
Abs Immature Granulocytes: 0.01 K/uL (ref 0.00–0.07)
Basophils Absolute: 0 K/uL (ref 0.0–0.1)
Basophils Relative: 1 %
Eosinophils Absolute: 0.2 K/uL (ref 0.0–0.5)
Eosinophils Relative: 4 %
HCT: 41.6 % (ref 36.0–46.0)
Hemoglobin: 14.2 g/dL (ref 12.0–15.0)
Immature Granulocytes: 0 %
Lymphocytes Relative: 20 %
Lymphs Abs: 0.8 K/uL (ref 0.7–4.0)
MCH: 29.6 pg (ref 26.0–34.0)
MCHC: 34.1 g/dL (ref 30.0–36.0)
MCV: 86.8 fL (ref 80.0–100.0)
Monocytes Absolute: 0.4 K/uL (ref 0.1–1.0)
Monocytes Relative: 10 %
Neutro Abs: 2.8 K/uL (ref 1.7–7.7)
Neutrophils Relative %: 65 %
Platelets: 144 K/uL — ABNORMAL LOW (ref 150–400)
RBC: 4.79 MIL/uL (ref 3.87–5.11)
RDW: 13.3 % (ref 11.5–15.5)
WBC: 4.2 K/uL (ref 4.0–10.5)
nRBC: 0 % (ref 0.0–0.2)

## 2024-02-02 LAB — COMPREHENSIVE METABOLIC PANEL WITH GFR
ALT: 15 U/L (ref 0–44)
AST: 23 U/L (ref 15–41)
Albumin: 4 g/dL (ref 3.5–5.0)
Alkaline Phosphatase: 105 U/L (ref 38–126)
Anion gap: 11 (ref 5–15)
BUN: 23 mg/dL (ref 8–23)
CO2: 25 mmol/L (ref 22–32)
Calcium: 9.8 mg/dL (ref 8.9–10.3)
Chloride: 104 mmol/L (ref 98–111)
Creatinine, Ser: 1.01 mg/dL — ABNORMAL HIGH (ref 0.44–1.00)
GFR, Estimated: 56 mL/min — ABNORMAL LOW (ref >60)
Glucose, Bld: 104 mg/dL — ABNORMAL HIGH (ref 70–99)
Potassium: 4 mmol/L (ref 3.5–5.1)
Sodium: 140 mmol/L (ref 135–145)
Total Bilirubin: 0.4 mg/dL (ref 0.0–1.2)
Total Protein: 7 g/dL (ref 6.5–8.1)

## 2024-02-02 LAB — VITAMIN D 25 HYDROXY (VIT D DEFICIENCY, FRACTURES): Vit D, 25-Hydroxy: 41.19 ng/mL (ref 30–100)

## 2024-02-09 ENCOUNTER — Inpatient Hospital Stay: Admitting: Oncology

## 2024-02-09 VITALS — BP 137/77 | HR 75 | Temp 98.1°F | Resp 19 | Wt 171.5 lb

## 2024-02-09 DIAGNOSIS — M8580 Other specified disorders of bone density and structure, unspecified site: Secondary | ICD-10-CM | POA: Insufficient documentation

## 2024-02-09 DIAGNOSIS — D0511 Intraductal carcinoma in situ of right breast: Secondary | ICD-10-CM

## 2024-02-09 DIAGNOSIS — E639 Nutritional deficiency, unspecified: Secondary | ICD-10-CM | POA: Diagnosis not present

## 2024-02-09 DIAGNOSIS — E559 Vitamin D deficiency, unspecified: Secondary | ICD-10-CM

## 2024-02-09 MED ORDER — ANASTROZOLE 1 MG PO TABS: 1.0000 mg | ORAL_TABLET | Freq: Every day | ORAL | 3 refills | Status: AC

## 2024-02-09 NOTE — Assessment & Plan Note (Addendum)
 Most recent bone density from 12/25/2021 showed a T-score of -1.7. Repeat bone density scan. Review results at next visit.

## 2024-02-09 NOTE — Progress Notes (Signed)
 [facility] OFFICE PROGRESS NOTE  Candace Suzzane POUR, MD  ASSESSMENT & PLAN:    Assessment & Plan Ductal carcinoma in situ (DCIS) of right breast Reports she has not been taking anastrozole  for greater than 1 month because she ran out. Labs from 02/02/2024 show more or less stable labs.  Mild thrombocytopenia but this is stable. No lymphadenopathy palpable. I recommend follow-up in 6 months with labs and see NP.  Mammogram will be due in September. Vitamin D  deficiency Vitamin D  level 41.19 She is currently not taking a vitamin D  supplement. She is drinking Ensure/boost and instant breakfast given she is unable to swallow solids. Levels are great.  Will recheck in 6 months. Osteopenia, unspecified location Most recent bone density from 12/25/2021 showed a T-score of -1.7. Repeat bone density scan. Review results at next visit. Poor nutrition She has been unable to eat solid foods since radiation in December 2015. She drinks Ensure 4 to 5 cans 220- 350-calorie with 8 ounces of milk per day.  Weight is more or less stable. She is able to swallow small tablets.  Orders Placed This Encounter  Procedures   DG Bone Density    Standing Status:   Future    Expected Date:   03/11/2024    Expiration Date:   02/08/2025    Reason for Exam (SYMPTOM  OR DIAGNOSIS REQUIRED):   Osteopenia on AI.    Preferred imaging location?:   [facility]    Release to patient:   Immediate   Comprehensive metabolic panel    Standing Status:   Future    Expected Date:   08/03/2024    Expiration Date:   02/08/2025   CBC with Differential    Standing Status:   Future    Expected Date:   08/03/2024    Expiration Date:   02/08/2025   VITAMIN D  25 Hydroxy (Vit-D Deficiency, Fractures)    Standing Status:   Future    Expected Date:   08/03/2024    Expiration Date:   02/08/2025    INTERVAL HISTORY: Patient returns for follow-up.  Reports overall doing well since her last visit.  Reports she is  only drinking liquids at this time due to poor toleration of solids.  Energy levels are low.  Weight is more or less stable.  She has pain to her right neck from radiation.  She is followed by ENT Dr. Karis and has had imaging done which does not show anything that is concerning.  Denies any breast pain or lymph nodes.  Reports she has not been taking anastrozole  because she ran out.  She would like a refill.  She thinks has been about a month or so.  We reviewed mammogram, ultrasound, CBC, CMP vitamin D .  SUMMARY OF HEMATOLOGIC HISTORY: Oncology History Overview Note  Assessment: Right breast DCIS, high-grade: - She felt a lump in the right breast UOQ 2 months ago with some soreness. - Mammogram (10/28/2021) calcifications in the UOQ right breast. - Biopsy (11/17/2021): High-grade DCIS, solid type with comedonecrosis.  Negative for invasive carcinoma.  Microcalcification present.  DCIS measures 6 mm in greatest linear extent.  ER 60% positive and PR 1% positive. - She had history of fibrocystic disease and had previously cyst removals x3 which were benign. - Right breast lumpectomy on 12/15/2021 - Pathology: Focus of microinvasive carcinoma, less than 1 mm arising in a background of high-grade DCIS with necrosis and calcifications.  Margins negative for invasive carcinoma.  DCIS is focally less than 1 mm from posterior margin.  ER 60% and PR 1% positive.  PT42mi, PNx. - XRT to the right breast from 02/02/2022 through 02/13/2022 - Anastrozole  started 01/06/2022    Social/family history: - She lives at home by herself.  She is seen with her granddaughter.  She is independent of ADLs and IADLs.  She did construction/electrical work.  Quit smoking in 1984. - Maternal grandfather had lung cancer.  3.  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   01/19/2014 PET scan   Tongue mass with 2 level II Right neck nodes T2N2b, Stage IVA   02/08/2014 - 04/03/2014 Radiation Therapy   Initiation of radiation therapy with 70 Gy delivered to the right base of tongue/tonsil 63 Gy to high risk nodal echelons, 56 Gy to the intermediate r base of tongue/tonsil/bilateral neck nodes   02/08/2014 - 03/15/2014 Chemotherapy   weekly cisplatin, only 4 doses given, severe toxicity with neutropenia, dehydration, nausea, vomiting, hospitalization and obstipation   10/24/2015 PET scan   low level non malig range hyper metab in L apical sub solid pulm nodule, suspicious for low grade adeno. diffuse tongue hypermetab without CT correlation   10/28/2015 Procedure   Bronchoscopy navigation, electromagnetic navigation, bronchoscopy with biopsy of LUL lung lesion, placement of fiducial markers X 3   10/28/2015 Pathology Results   Scant benign lung tissue, no tumor seen   Ductal carcinoma in situ (DCIS) of left breast  11/26/2022 Initial Diagnosis   Ductal carcinoma in situ (DCIS) of left breast   11/26/2022 Cancer Staging   Staging form: Breast, AJCC 8th Edition - Clinical stage from 11/26/2022: Stage 0 (cTis (DCIS), cN0, cM0, ER-, PR-, HER2: Not Assessed) - Signed by Rogers Hai, MD on 11/26/2022 Histopathologic type: Intraductal carcinoma, noninfiltrating, NOS Stage prefix: Initial diagnosis Nuclear grade: G3      CBC    Component Value Date/Time   WBC 4.2 02/02/2024 1330   RBC 4.79 02/02/2024 1330   HGB 14.2 02/02/2024 1330   HGB 13.5 10/22/2022 1002   HCT 41.6 02/02/2024 1330   HCT 39.6 10/22/2022 1002   PLT 144 (L) 02/02/2024 1330   PLT 151 10/22/2022 1002   MCV 86.8 02/02/2024 1330   MCV 86 10/22/2022 1002   MCH 29.6 02/02/2024 1330   MCHC 34.1 02/02/2024 1330   RDW 13.3 02/02/2024 1330   RDW 13.8 10/22/2022 1002   LYMPHSABS 0.8 02/02/2024 1330   LYMPHSABS 0.8 10/22/2022 1002   MONOABS 0.4  02/02/2024 1330   EOSABS 0.2 02/02/2024 1330   EOSABS 0.1 10/22/2022 1002   BASOSABS 0.0 02/02/2024 1330   BASOSABS 0.0 10/22/2022 1002       Latest Ref Rng & Units 02/02/2024    1:30 PM 08/03/2023    1:23 PM 11/03/2022    1:23 PM  CMP  Glucose 70 - 99 mg/dL 895  883  882   BUN 8 - 23 mg/dL 23  25  26    Creatinine 0.44 - 1.00 mg/dL 8.98  9.06  9.02   Sodium 135 - 145 mmol/L 140  136  138   Potassium 3.5 - 5.1 mmol/L 4.0  3.6  3.7   Chloride 98 - 111 mmol/L 104  100  105   CO2 22 - 32 mmol/L 25  26  25   Calcium 8.9 - 10.3 mg/dL 9.8  9.3  9.2   Total Protein 6.5 - 8.1 g/dL 7.0  6.8  6.8   Total Bilirubin 0.0 - 1.2 mg/dL 0.4  0.5  0.6   Alkaline Phos 38 - 126 U/L 105  82  72   AST 15 - 41 U/L 23  23  22    ALT 0 - 44 U/L 15  16  18       Lab Results  Component Value Date   VITAMINB12 354 11/21/2020    Vitals:   02/09/24 1423  BP: 137/77  Pulse: 75  Resp: 19  Temp: 98.1 F (36.7 C)  SpO2: 99%    Review of System:  Review of Systems  Constitutional:  Positive for malaise/fatigue and weight loss.  HENT:         Trouble swallowing  Gastrointestinal:  Positive for nausea and vomiting.  Neurological:  Positive for dizziness, tingling and headaches.  Psychiatric/Behavioral:  Positive for depression. The patient has insomnia.     Physical Exam: Physical Exam Constitutional:      Appearance: Normal appearance.  HENT:     Head: Normocephalic and atraumatic.  Eyes:     Pupils: Pupils are equal, round, and reactive to light.  Cardiovascular:     Rate and Rhythm: Normal rate and regular rhythm.     Heart sounds: Normal heart sounds. No murmur heard. Pulmonary:     Effort: Pulmonary effort is normal.     Breath sounds: Normal breath sounds. No wheezing.  Chest:     Comments: Deferred due to recent mammogram. Abdominal:     General: Bowel sounds are normal. There is no distension.     Palpations: Abdomen is soft.     Tenderness: There is no abdominal tenderness.   Musculoskeletal:        General: Normal range of motion.     Cervical back: Normal range of motion.  Skin:    General: Skin is warm and dry.     Findings: No rash.  Neurological:     Mental Status: She is alert and oriented to person, place, and time.     Gait: Gait is intact.  Psychiatric:        Mood and Affect: Mood and affect normal.        Cognition and Memory: Memory normal.        Judgment: Judgment normal.      I spent 25 minutes dedicated to the care of this patient (face-to-face and non-face-to-face) on the date of the encounter to include what is described in the assessment and plan.,  Delon Hope, NP 02/09/2024 3:08 PM

## 2024-02-09 NOTE — Assessment & Plan Note (Addendum)
 Reports she has not been taking anastrozole  for greater than 1 month because she ran out. Labs from 02/02/2024 show more or less stable labs.  Mild thrombocytopenia but this is stable. No lymphadenopathy palpable. I recommend follow-up in 6 months with labs and see NP.  Mammogram will be due in September.

## 2024-02-21 ENCOUNTER — Encounter: Payer: Self-pay | Admitting: Internal Medicine

## 2024-02-21 ENCOUNTER — Ambulatory Visit (INDEPENDENT_AMBULATORY_CARE_PROVIDER_SITE_OTHER): Admitting: Internal Medicine

## 2024-02-21 VITALS — BP 112/72 | HR 58 | Ht 67.5 in | Wt 171.0 lb

## 2024-02-21 DIAGNOSIS — E039 Hypothyroidism, unspecified: Secondary | ICD-10-CM

## 2024-02-21 DIAGNOSIS — M51361 Other intervertebral disc degeneration, lumbar region with lower extremity pain only: Secondary | ICD-10-CM | POA: Diagnosis not present

## 2024-02-21 DIAGNOSIS — E440 Moderate protein-calorie malnutrition: Secondary | ICD-10-CM | POA: Diagnosis not present

## 2024-02-21 DIAGNOSIS — I10 Essential (primary) hypertension: Secondary | ICD-10-CM

## 2024-02-21 DIAGNOSIS — J0100 Acute maxillary sinusitis, unspecified: Secondary | ICD-10-CM | POA: Diagnosis not present

## 2024-02-21 DIAGNOSIS — F411 Generalized anxiety disorder: Secondary | ICD-10-CM

## 2024-02-21 MED ORDER — METHYLPREDNISOLONE ACETATE 80 MG/ML IJ SUSP
80.0000 mg | Freq: Once | INTRAMUSCULAR | Status: AC
  Administered 2024-02-21: 80 mg via INTRAMUSCULAR

## 2024-02-21 MED ORDER — FLUTICASONE PROPIONATE 50 MCG/ACT NA SUSP: 2.0000 | Freq: Every day | NASAL | 1 refills | Status: AC

## 2024-02-21 MED ORDER — AZITHROMYCIN 250 MG PO TABS: ORAL_TABLET | ORAL | 0 refills | Status: AC

## 2024-02-21 NOTE — Progress Notes (Signed)
 Established Patient Office Visit  Subjective:  Patient ID: Candace Lewis, female    DOB: [birth year]  Age: 83 y.o. MRN: [id number]  CC:  Chief Complaint  Patient presents with   Gastroesophageal Reflux    Four month follow up    Sinusitis    Pain and pressure across front of the face, headache, lots of mucus    HPI Candace Lewis is a 83 y.o. female with past medical history of breast ca., tongue ca., HTN, HLD, hypothyroidism and GERD who presents for f/u of her chronic medical conditions.  She reports nasal congestion, postnasal drip, sinus pressure related right-sided facial pain and sore throat for the last 1 week.  She has history of allergic sinusitis.  Denies any fever or chills currently.  Denies any recent worsening of dyspnea.  HTN: BP is well-controlled. Takes medication - metoprolol  tartrate 25 mg BID regularly. Patient denies headache, dizziness or dyspnea. She has palpitations, but usually controlled with Metoprolol .  GERD: She takes Omeprazole  for GERD. Denies any nausea or vomiting.  She has chronic dysphagia and can only tolerate liquid.  She takes Equate vanilla flavor protein supplement QID.  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  She is still losing weight despite protein supplements, has lost about 6 lbs since the last visit.  She has history of hypothyroidism, and takes levothyroxine  100 mcg daily.  Denies any recent change in appetite, worsening of fatigue, or tremors.  She has right sided neck mass behind TMJ area, which has been painful upon chewing and movement (flexion of neck).  US  of neck was unremarkable in 01/24. She has seen Dr Karis as well, and was reassured about it being benign.  She reports chronic insomnia.  She has spells of anxiety at nighttime.  Denies any anhedonia, SI or HI currently.  She has tried melatonin without much relief.  She did not like the effect of trazodone .  She tried Elavil , but felt  lack of control due to long sleep hours with it.  She was given Lexapro  for GAD, but felt numb (no feeling) with it, and has stopped it.  Left breast ca. status post partial mastectomy: She had partial mastectomy in 08/24.  She has completed radiation therapy.  She is on anastrozole  since her right breast ca. in 2023.  She reports pins and needlelike sensation in the breast area since her treatment.  She has been taking Goody powder for it without much relief. She was given Gabapentin  for neuropathic pain, but had dizziness with it.  She reports recent worsening of low back pain for the last 2 weeks.  Her back pain is constant, sharp, radiating to bilateral LE and worse with movement.  Denies any recent injury or fall.  She reports history of heavy lifting in the past, which has led to DDD of lumbar spine.   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 MICROCALCIFICATIONS PRESENT.   BREAST BIOPSY Left 11/16/2022   FOCI OF DUCTAL CARCINOMA IN SITU, HIGH GRADE - NECROSIS: PRESENT, PROMINENT - CALCIFICATIONSMM LT BREAST BX W LOC DEV 1ST LESION IMAGE BX SPEC STEREO GUIDE 11/16/2022 [facility]   CHOLECYSTECTOMY  2005   ESOPHAGOGASTRODUODENOSCOPY  06/19/2014   Dr. Donnel: LA Class B esophagitis. PEG tube in place. erythema in  proximal esophagus.    ESOPHAGOGASTRODUODENOSCOPY (EGD) WITH PROPOFOL  N/A 03/11/2017   Web in upper third of esophagus s/p dilation with scope passage, small hiatal hernia, normal duodenum, no specimens collected   FUDUCIAL PLACEMENT N/A 10/28/2015   Procedure: PLACEMENT OF FUDUCIAL TIMES THREE; LEFT UPPER LOBE BIOPSY;  Surgeon: Dallas KATHEE Jude, MD;  Location: [facility] OR;  Service: Thoracic;  Laterality: N/A;   MASTECTOMY, PARTIAL Left 1984   no cancer, fibrocystic breast disease   SKIN SURGERY  1960   moles removed from various areas per patient report   TONSILLECTOMY  1950   per patient report   TUBAL LIGATION  1976   VAGINAL HYSTERECTOMY  1981   vaginal hyst per patient report   VIDEO BRONCHOSCOPY WITH ENDOBRONCHIAL NAVIGATION N/A 10/28/2015   Procedure: VIDEO BRONCHOSCOPY WITH ENDOBRONCHIAL NAVIGATION;  Surgeon: Dallas KATHEE Jude, MD;  Location: [facility] OR;  Service: Thoracic;  Laterality: N/A;    Family History  Problem Relation Age of Onset   Heart disease Mother     Social History   Socioeconomic History   Marital status: Widowed    Spouse name: Not on file   Number of children: 2   Years of education: Not on file   Highest education level: Not on file  Occupational History   Occupation: retired  Tobacco Use   Smoking status: Former    Current packs/day: 0.00    Average packs/day: 1 pack/day for 23.0 years (23.0 ttl pk-yrs)    Types: Cigarettes    Start date: 05/04/1959    Quit date: 05/03/1982    Years since quitting: 41.8   Smokeless tobacco: Never  Vaping Use   Vaping status: Never Used  Substance and Sexual Activity   Alcohol use: No    Alcohol/week: 0.0 standard drinks of alcohol   Drug use: No   Sexual activity: Never    Birth control/protection: None  Other Topics Concern   Not on file  Social History Narrative   Not on file   Social Drivers of Health   Financial Resource Strain: Low Risk  (07/27/2023)   Overall Financial Resource Strain (CARDIA)     Difficulty of Paying Living Expenses: Not hard at all  Food Insecurity: No Food Insecurity (12/16/2023)   Received from [facility]   Hunger Vital Sign    Within the past 12 months, you worried that your food would run out before you got the money to buy more.: Never true    Within the past 12 months, the food you bought just didn't last and you didn't have money to get more.: Never true  Transportation Needs: No Transportation Needs (12/16/2023)   Received from [facility]   PRAPARE - Transportation    Lack of Transportation (Medical): No    Lack of Transportation (Non-Medical): No  Physical Activity: Insufficiently Active (07/27/2023)  Exercise Vital Sign    Days of Exercise per Week: 1 day    Minutes of Exercise per Session: 10 min  Stress: No Stress Concern Present (07/27/2023)   [employer] of Occupational Health - Occupational Stress Questionnaire    Feeling of Stress : Not at all  Social Connections: Socially Isolated (07/27/2023)   Social Connection and Isolation Panel    Frequency of Communication with Friends and Family: More than three times a week    Frequency of Social Gatherings with Friends and Family: Twice a week    Attends Religious Services: Never    Database administrator or Organizations: No    Attends Banker Meetings: Never    Marital Status: Widowed  Intimate Partner Violence: Not At Risk (07/27/2023)   Humiliation, Afraid, Rape, and Kick questionnaire    Fear of Current or Ex-Partner: No    Emotionally Abused: No    Physically Abused: No    Sexually Abused: No    Outpatient Medications Prior to Visit  Medication Sig Dispense Refill   anastrozole  (ARIMIDEX ) 1 MG tablet Take 1 tablet (1 mg total) by mouth daily. 100 tablet 3   Aspirin-Acetaminophen (GOODYS BACK & BODY PAIN) 500-325 MG PACK Take 1 packet by mouth daily as needed (pain).     levothyroxine  (SYNTHROID ) 100 MCG tablet TAKE 1 TABLET BY MOUTH ONCE DAILY BEFORE BREAKFAST 90  tablet 0   metoprolol  tartrate (LOPRESSOR ) 25 MG tablet Take 1 tablet by mouth twice daily 180 tablet 2   omeprazole  (PRILOSEC OTC) 20 MG tablet Take 20 mg by mouth daily.     triamcinolone  cream (KENALOG ) 0.1 % Apply 1 Application topically 2 (two) times daily. (Patient taking differently: Apply 1 Application topically 2 (two) times daily as needed (irritation/itching).) 30 g 0   trolamine salicylate (ASPERCREME) 10 % cream Apply 1 Application topically as needed for muscle pain.     No facility-administered medications prior to visit.    Allergies  Allergen Reactions   Adhesive [Tape] Rash    Reports rash and blistering if any adhesive tape   Latex Rash    Rash and skin blistering per patient report    ROS Review of Systems  Constitutional:  Negative for chills and fever.  HENT:  Negative for congestion and sore throat.   Eyes:  Negative for pain and discharge.  Respiratory:  Negative for cough and shortness of breath.   Cardiovascular:  Negative for chest pain and palpitations.  Gastrointestinal:  Negative for abdominal pain, diarrhea, nausea and vomiting.  Endocrine: Negative for polydipsia and polyuria.  Genitourinary:  Negative for dysuria and hematuria.  Musculoskeletal:  Positive for arthralgias, back pain and neck pain. Negative for neck stiffness.  Skin:  Negative for rash.  Neurological:  Negative for dizziness and weakness.  Psychiatric/Behavioral:  Positive for sleep disturbance. Negative for agitation and behavioral problems. The patient is nervous/anxious.       Objective:    Physical Exam Vitals reviewed.  Constitutional:      General: She is not in acute distress.    Appearance: She is not diaphoretic.     Comments: Muscle wasting  HENT:     Head: Normocephalic and atraumatic.     Mouth/Throat:     Mouth: Mucous membranes are moist.  Eyes:     General: No scleral icterus.    Extraocular Movements: Extraocular movements intact.  Neck:     Comments:  Hardening of right submandibular area - chronic Cardiovascular:  Rate and Rhythm: Normal rate and regular rhythm.     Heart sounds: Normal heart sounds. No murmur heard. Pulmonary:     Breath sounds: Normal breath sounds. No wheezing or rales.  Abdominal:     Palpations: Abdomen is soft.     Tenderness: There is no abdominal tenderness.  Musculoskeletal:     Cervical back: Neck supple. No tenderness.     Lumbar back: Tenderness (Paraspinal) present. Decreased range of motion.     Right lower leg: No edema.     Left lower leg: No edema.  Skin:    General: Skin is warm.     Findings: No rash.  Neurological:     General: No focal deficit present.     Mental Status: She is alert and oriented to person, place, and time.  Psychiatric:        Mood and Affect: Mood is anxious.        Behavior: Behavior normal.     BP 112/72 (BP Location: Left Arm)   Pulse (!) 58   Ht 5' 7.5 (1.715 m)   Wt 171 lb (77.6 kg)   SpO2 98%   BMI 26.39 kg/m  Wt Readings from Last 3 Encounters:  02/21/24 171 lb (77.6 kg)  02/09/24 171 lb 8 oz (77.8 kg)  10/19/23 177 lb 3.2 oz (80.4 kg)    Lab Results  Component Value Date   TSH 2.950 10/19/2023   Lab Results  Component Value Date   WBC 4.2 02/02/2024   HGB 14.2 02/02/2024   HCT 41.6 02/02/2024   MCV 86.8 02/02/2024   PLT 144 (L) 02/02/2024   Lab Results  Component Value Date   NA 140 02/02/2024   K 4.0 02/02/2024   CO2 25 02/02/2024   GLUCOSE 104 (H) 02/02/2024   BUN 23 02/02/2024   CREATININE 1.01 (H) 02/02/2024   BILITOT 0.4 02/02/2024   ALKPHOS 105 02/02/2024   AST 23 02/02/2024   ALT 15 02/02/2024   PROT 7.0 02/02/2024   ALBUMIN 4.0 02/02/2024   CALCIUM 9.8 02/02/2024   ANIONGAP 11 02/02/2024   EGFR 63 10/22/2022   Lab Results  Component Value Date   CHOL 153 10/22/2022   Lab Results  Component Value Date   HDL 48 10/22/2022   Lab Results  Component Value Date   LDLCALC 75 10/22/2022   Lab Results  Component  Value Date   TRIG 177 (H) 10/22/2022   Lab Results  Component Value Date   CHOLHDL 3.2 10/22/2022   Lab Results  Component Value Date   HGBA1C 5.3 10/22/2022      Assessment & Plan:   Problem List Items Addressed This Visit       Cardiovascular and Mediastinum   Essential hypertension - Primary   BP Readings from Last 1 Encounters:  02/21/24 112/72   Well-controlled with Metoprolol  25 mg BID Tried to switch from Lopressor  to Toprol , but did not help with palpitations - placed back on Lopressor  Counseled for compliance with the medications      Relevant Orders   CMP14+EGFR     Respiratory   Acute non-recurrent maxillary sinusitis   Considering persistent symptoms for the last 1 week, started empiric azithromycin Flonase as needed for nasal congestion Can perform nasal saline lavage if tolerated Advised to use vaporizer and/or sinus inhaler as needed for nasal congestion/allergies      Relevant Medications   azithromycin (ZITHROMAX) 250 MG tablet   fluticasone (FLONASE) 50 MCG/ACT nasal spray  Endocrine   Hypothyroidism   Lab Results  Component Value Date   TSH 2.950 10/19/2023   On Levothyroxine  100 mcg once daily Will recheck TSH and free T4 later      Relevant Orders   CMP14+EGFR   TSH + free T4     Musculoskeletal and Integument   DDD (degenerative disc disease), lumbar   MRI of lumbar spine reviewed Chronic low back pain Has had steroid injections Depo-Medrol  IM today Tylenol arthritis as needed for pain Followed by orthopedic surgery        Other   GAD (generalized anxiety disorder)   Her insomnia is likely due to GAD Has tried trazodone  and Elavil  for insomnia Had started Lexapro  5 mg once daily for GAD, but felt lack of feelings Prefers to avoid any medicine for now for GAD      Moderate protein-calorie malnutrition   Has chronic weight loss, due to dysphagia -does not tolerate solids, relies on protein supplements Advised to  continue protein supplements regularly Check CMP         Meds ordered this encounter  Medications   azithromycin (ZITHROMAX) 250 MG tablet    Sig: Take 2 tablets on day 1, then 1 tablet daily on days 2 through 5    Dispense:  6 tablet    Refill:  0   fluticasone (FLONASE) 50 MCG/ACT nasal spray    Sig: Place 2 sprays into both nostrils daily.    Dispense:  16 g    Refill:  1   methylPREDNISolone  acetate (DEPO-MEDROL ) injection 80 mg    Follow-up: Return in about 4 months (around 06/23/2024).    Candace MARLA Blanch, MD

## 2024-02-21 NOTE — Assessment & Plan Note (Signed)
 BP Readings from Last 1 Encounters:  02/21/24 112/72   Well-controlled with Metoprolol  25 mg BID Tried to switch from Lopressor  to Toprol , but did not help with palpitations - placed back on Lopressor  Counseled for compliance with the medications

## 2024-02-21 NOTE — Assessment & Plan Note (Signed)
 Her insomnia is likely due to GAD Has tried trazodone and Elavil for insomnia Had started Lexapro 5 mg once daily for GAD, but felt lack of feelings Prefers to avoid any medicine for now for GAD

## 2024-02-21 NOTE — Assessment & Plan Note (Signed)
 Lab Results  Component Value Date   TSH 2.950 10/19/2023   On Levothyroxine  100 mcg once daily Will recheck TSH and free T4 later

## 2024-02-21 NOTE — Assessment & Plan Note (Addendum)
 Considering persistent symptoms for the last 1 week, started empiric azithromycin Flonase as needed for nasal congestion Can perform nasal saline lavage if tolerated Advised to use vaporizer and/or sinus inhaler as needed for nasal congestion/allergies

## 2024-02-21 NOTE — Assessment & Plan Note (Signed)
MRI of lumbar spine reviewed Chronic low back pain Has had steroid injections Depo-Medrol IM today Tylenol arthritis as needed for pain Followed by orthopedic surgery 

## 2024-02-21 NOTE — Patient Instructions (Signed)
 Please continue to take medications as prescribed.  Please continue to follow high protein diet and ambulate as tolerated.  Please get fasting blood tests done before the next visit.

## 2024-02-21 NOTE — Assessment & Plan Note (Signed)
 Has chronic weight loss, due to dysphagia -does not tolerate solids, relies on protein supplements Advised to continue protein supplements regularly Check CMP

## 2024-03-02 ENCOUNTER — Telehealth: Payer: Self-pay

## 2024-03-02 NOTE — Telephone Encounter (Signed)
 Copied from CRM #[id number]. Topic: Clinical - Request for Lab/Test Order >> Mar 02, 2024  1:24 PM Shanda MATSU wrote: Reason for CRM: Patient called in to see if she needs to have labs done now or can she wait till right before her next appt, is req a call back in regards to this.

## 2024-03-02 NOTE — Telephone Encounter (Signed)
 Will get before next visit.

## 2024-03-06 ENCOUNTER — Encounter: Payer: Self-pay | Admitting: Radiology

## 2024-03-23 ENCOUNTER — Ambulatory Visit: Payer: Self-pay | Admitting: Internal Medicine

## 2024-03-23 LAB — CMP14+EGFR
ALT: 36 IU/L — ABNORMAL HIGH (ref 0–32)
AST: 32 IU/L (ref 0–40)
Albumin: 3.9 g/dL (ref 3.7–4.7)
Alkaline Phosphatase: 108 IU/L (ref 48–129)
BUN/Creatinine Ratio: 24 (ref 12–28)
BUN: 21 mg/dL (ref 8–27)
Bilirubin Total: 0.4 mg/dL (ref 0.0–1.2)
CO2: 24 mmol/L (ref 20–29)
Calcium: 9.5 mg/dL (ref 8.7–10.3)
Chloride: 105 mmol/L (ref 96–106)
Creatinine, Ser: 0.88 mg/dL (ref 0.57–1.00)
Globulin, Total: 2.6 g/dL (ref 1.5–4.5)
Glucose: 94 mg/dL (ref 70–99)
Potassium: 4.3 mmol/L (ref 3.5–5.2)
Sodium: 143 mmol/L (ref 134–144)
Total Protein: 6.5 g/dL (ref 6.0–8.5)
eGFR: 66 mL/min/1.73 (ref >59)

## 2024-03-23 LAB — TSH+FREE T4
Free T4: 1.18 ng/dL (ref 0.82–1.77)
TSH: 2.99 u[IU]/mL (ref 0.450–4.500)

## 2024-03-27 ENCOUNTER — Other Ambulatory Visit: Payer: Self-pay | Admitting: Internal Medicine

## 2024-03-27 DIAGNOSIS — E039 Hypothyroidism, unspecified: Secondary | ICD-10-CM

## 2024-05-15 ENCOUNTER — Other Ambulatory Visit: Payer: Self-pay | Admitting: Internal Medicine

## 2024-06-26 ENCOUNTER — Ambulatory Visit: Admitting: Internal Medicine

## 2024-08-01 ENCOUNTER — Ambulatory Visit

## 2024-08-03 ENCOUNTER — Other Ambulatory Visit (HOSPITAL_COMMUNITY)

## 2024-08-03 ENCOUNTER — Other Ambulatory Visit

## 2024-08-10 ENCOUNTER — Ambulatory Visit: Admitting: Oncology

## 2024-08-10 ENCOUNTER — Inpatient Hospital Stay: Admitting: Oncology

## 2024-08-15 ENCOUNTER — Inpatient Hospital Stay: Admitting: Oncology
